# Patient Record
Sex: Female | Born: 1975
Health system: Southern US, Community
[De-identification: ages and names within clinical notes are randomized; demographics above are authoritative.]

## PROBLEM LIST (undated history)

## (undated) ENCOUNTER — Inpatient Hospital Stay (HOSPITAL_COMMUNITY): Payer: Self-pay

## (undated) DIAGNOSIS — K219 Gastro-esophageal reflux disease without esophagitis: Secondary | ICD-10-CM

## (undated) DIAGNOSIS — I10 Essential (primary) hypertension: Secondary | ICD-10-CM

## (undated) DIAGNOSIS — O24112 Pre-existing diabetes mellitus, type 2, in pregnancy, second trimester: Secondary | ICD-10-CM

## (undated) DIAGNOSIS — O169 Unspecified maternal hypertension, unspecified trimester: Secondary | ICD-10-CM

## (undated) DIAGNOSIS — O24419 Gestational diabetes mellitus in pregnancy, unspecified control: Secondary | ICD-10-CM

## (undated) DIAGNOSIS — R05 Cough: Secondary | ICD-10-CM

## (undated) DIAGNOSIS — E119 Type 2 diabetes mellitus without complications: Secondary | ICD-10-CM

## (undated) DIAGNOSIS — O09529 Supervision of elderly multigravida, unspecified trimester: Secondary | ICD-10-CM

## (undated) DIAGNOSIS — R000 Tachycardia, unspecified: Secondary | ICD-10-CM

## (undated) DIAGNOSIS — O0990 Supervision of high risk pregnancy, unspecified, unspecified trimester: Secondary | ICD-10-CM

## (undated) DIAGNOSIS — O4400 Placenta previa specified as without hemorrhage, unspecified trimester: Secondary | ICD-10-CM

## (undated) HISTORY — PX: OTHER SURGICAL HISTORY: SHX169

## (undated) HISTORY — PX: BREAST REDUCTION SURGERY: SHX8

## (undated) HISTORY — DX: Type 2 diabetes mellitus without complications: E11.9

## (undated) HISTORY — DX: Gastro-esophageal reflux disease without esophagitis: K21.9

## (undated) HISTORY — PX: TONSILLECTOMY: SUR1361

## (undated) HISTORY — PX: ABDOMINAL HYSTERECTOMY: SHX81

---

## 2006-05-25 DIAGNOSIS — O24419 Gestational diabetes mellitus in pregnancy, unspecified control: Secondary | ICD-10-CM

## 2010-07-22 ENCOUNTER — Emergency Department (HOSPITAL_COMMUNITY)
Admission: EM | Admit: 2010-07-22 | Discharge: 2010-07-22 | Disposition: A | Discharge status: Home / Self Care | Payer: Medicaid Other | Attending: Emergency Medicine | Admitting: Emergency Medicine

## 2010-07-22 ENCOUNTER — Emergency Department (HOSPITAL_COMMUNITY): Payer: Medicaid Other

## 2010-07-22 DIAGNOSIS — I10 Essential (primary) hypertension: Secondary | ICD-10-CM | POA: Insufficient documentation

## 2010-07-22 DIAGNOSIS — D72829 Elevated white blood cell count, unspecified: Secondary | ICD-10-CM | POA: Insufficient documentation

## 2010-07-22 DIAGNOSIS — R000 Tachycardia, unspecified: Secondary | ICD-10-CM | POA: Insufficient documentation

## 2010-07-22 DIAGNOSIS — R109 Unspecified abdominal pain: Secondary | ICD-10-CM | POA: Insufficient documentation

## 2010-07-22 LAB — COMPREHENSIVE METABOLIC PANEL
Alkaline Phosphatase: 78 U/L (ref 39–117)
BUN: 11 mg/dL (ref 6–23)
CO2: 27 mEq/L (ref 19–32)
Chloride: 104 mEq/L (ref 96–112)
Creatinine, Ser: 0.65 mg/dL (ref 0.4–1.2)
GFR calc non Af Amer: >60 mL/min (ref >60)
Glucose, Bld: 121 mg/dL — ABNORMAL HIGH (ref 70–99)
Total Bilirubin: 0.1 mg/dL — ABNORMAL LOW (ref 0.3–1.2)

## 2010-07-22 LAB — CBC
HCT: 37.4 % (ref 36.0–46.0)
Hemoglobin: 11.9 g/dL — ABNORMAL LOW (ref 12.0–15.0)
MCH: 25.2 pg — ABNORMAL LOW (ref 26.0–34.0)
MCHC: 31.8 g/dL (ref 30.0–36.0)
MCV: 79.2 fL (ref 78.0–100.0)

## 2010-07-22 LAB — POCT PREGNANCY, URINE: Preg Test, Ur: NEGATIVE

## 2010-07-22 LAB — URINALYSIS, ROUTINE W REFLEX MICROSCOPIC
Ketones, ur: NEGATIVE mg/dL
Protein, ur: NEGATIVE mg/dL
Urobilinogen, UA: 0.2 mg/dL (ref 0.0–1.0)

## 2010-07-22 LAB — DIFFERENTIAL
Basophils Relative: 0 % (ref 0–1)
Lymphs Abs: 2.7 10*3/uL (ref 0.7–4.0)
Monocytes Absolute: 1 10*3/uL (ref 0.1–1.0)
Monocytes Relative: 7 % (ref 3–12)
Neutro Abs: 10 10*3/uL — ABNORMAL HIGH (ref 1.7–7.7)

## 2010-07-22 LAB — LIPASE, BLOOD: Lipase: 28 U/L (ref 11–59)

## 2010-07-22 LAB — URINE MICROSCOPIC-ADD ON

## 2010-07-22 MED ORDER — IOHEXOL 300 MG/ML  SOLN
100.0000 mL | Freq: Once | INTRAMUSCULAR | Status: AC | PRN
  Administered 2010-07-22: 100 mL via INTRAVENOUS

## 2011-02-14 ENCOUNTER — Emergency Department (HOSPITAL_COMMUNITY): Payer: Medicaid Other

## 2011-02-14 ENCOUNTER — Emergency Department (HOSPITAL_COMMUNITY)
Admission: EM | Admit: 2011-02-14 | Discharge: 2011-02-14 | Disposition: A | Discharge status: Home / Self Care | Payer: Medicaid Other | Attending: Emergency Medicine | Admitting: Emergency Medicine

## 2011-02-14 DIAGNOSIS — M79609 Pain in unspecified limb: Secondary | ICD-10-CM | POA: Insufficient documentation

## 2011-02-14 DIAGNOSIS — J45909 Unspecified asthma, uncomplicated: Secondary | ICD-10-CM | POA: Insufficient documentation

## 2011-02-14 DIAGNOSIS — M722 Plantar fascial fibromatosis: Secondary | ICD-10-CM | POA: Insufficient documentation

## 2011-02-14 DIAGNOSIS — L680 Hirsutism: Secondary | ICD-10-CM | POA: Insufficient documentation

## 2011-02-14 DIAGNOSIS — I10 Essential (primary) hypertension: Secondary | ICD-10-CM | POA: Insufficient documentation

## 2011-02-14 DIAGNOSIS — M25579 Pain in unspecified ankle and joints of unspecified foot: Secondary | ICD-10-CM | POA: Insufficient documentation

## 2011-05-15 ENCOUNTER — Emergency Department (HOSPITAL_COMMUNITY)
Admission: EM | Admit: 2011-05-15 | Discharge: 2011-05-15 | Disposition: A | Discharge status: Home / Self Care | Payer: No Typology Code available for payment source | Attending: Emergency Medicine | Admitting: Emergency Medicine

## 2011-05-15 ENCOUNTER — Encounter (HOSPITAL_COMMUNITY): Payer: Self-pay | Admitting: *Deleted

## 2011-05-15 DIAGNOSIS — S40012A Contusion of left shoulder, initial encounter: Secondary | ICD-10-CM

## 2011-05-15 DIAGNOSIS — W208XXA Other cause of strike by thrown, projected or falling object, initial encounter: Secondary | ICD-10-CM | POA: Insufficient documentation

## 2011-05-15 DIAGNOSIS — Y9229 Other specified public building as the place of occurrence of the external cause: Secondary | ICD-10-CM | POA: Insufficient documentation

## 2011-05-15 DIAGNOSIS — S40019A Contusion of unspecified shoulder, initial encounter: Secondary | ICD-10-CM | POA: Insufficient documentation

## 2011-05-15 MED ORDER — IBUPROFEN 800 MG PO TABS: 800.0000 mg | ORAL_TABLET | Freq: Three times a day (TID) | ORAL | Status: AC

## 2011-05-15 MED ORDER — IBUPROFEN 800 MG PO TABS
800.0000 mg | ORAL_TABLET | Freq: Once | ORAL | Status: AC
  Administered 2011-05-15: 800 mg via ORAL
  Filled 2011-05-15: qty 1

## 2011-05-15 MED ORDER — HYDROCODONE-ACETAMINOPHEN 5-325 MG PO TABS: 1.0000 | ORAL_TABLET | ORAL | Status: AC | PRN

## 2011-05-15 NOTE — ED Provider Notes (Signed)
History     CSN: [id number]  Arrival date & time 05/15/11  2005   First MD Initiated Contact with Patient 05/15/11 2050      Chief Complaint  Patient presents with  . Shoulder Pain    (Consider location/radiation/quality/duration/timing/severity/associated sxs/prior treatment) HPI History provided by pt.   Pt was walking through the [facility] when a stack of empty, plastic boxes fell from a shelf onto her left shoulder.  Developed shoulder pain approx 5 minutes after accident.  Aggravated by palpation and turning her head.  No associated paresthesias.  Was not hit in head.    History reviewed. No pertinent past medical history.  History reviewed. No pertinent past surgical history.  History reviewed. No pertinent family history.  History  Substance Use Topics  . Smoking status: Not on file  . Smokeless tobacco: Not on file  . Alcohol Use: Not on file    OB History    Grav Para Term Preterm Abortions TAB SAB Ect Mult Living                  Review of Systems  All other systems reviewed and are negative.    Allergies  Review of patient's allergies indicates no known allergies.  Home Medications   Current Outpatient Rx  Name Route Sig Dispense Refill  . GUAIFENESIN ER 600 MG PO TB12 Oral Take 1,200 mg by mouth daily as needed. congestion    . ADULT MULTIVITAMIN W/MINERALS CH Oral Take 1 tablet by mouth daily.    Marland Kitchen HYDROCODONE-ACETAMINOPHEN 5-325 MG PO TABS Oral Take 1 tablet by mouth every 4 (four) hours as needed for pain. 20 tablet 0  . IBUPROFEN 800 MG PO TABS Oral Take 1 tablet (800 mg total) by mouth 3 (three) times daily. 6 tablet 0    BP 138/69  Pulse 96  Temp(Src) 99.7 F (37.6 C) (Oral)  Resp 20  SpO2 100%  Physical Exam  Nursing note and vitals reviewed. Constitutional: She is oriented to person, place, and time. She appears well-developed and well-nourished. No distress.  HENT:  Head: Normocephalic and atraumatic.  Eyes:       Normal  appearance  Neck: Normal range of motion.  Musculoskeletal:       No deformity, ecchymosis or abrasion of left shoulder.  Tenderness left proximal trapezius and over left scapula.  Full passive ROM of shoulder but pain in extreme flexion/abduction.  Distal N/V intact.  Entire spine non-tender.   Neurological: She is alert and oriented to person, place, and time.  Psychiatric: She has a normal mood and affect. Her behavior is normal.    ED Course  Procedures (including critical care time)  Labs Reviewed - No data to display No results found.   1. Contusion of left shoulder       MDM  Pt presents w/ right shoulder injury.  Low clinical suspicion for fx/dislocation based on mechanism and exam.  Pt has been reassured.  D/c'd home w/ vicodin and ibuprofen for pain.  Recommended ice and rest as well as f/u with PCP for persistent sx.         Otilio Miu, PA 05/16/11 1500
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
## 2011-05-16 NOTE — ED Provider Notes (Signed)
History/physical exam/procedure(s) were performed by non-physician practitioner and as supervising physician I was immediately available for consultation/collaboration. I have reviewed all notes and am in agreement with care and plan.   Allix Blomquist S Joedy Eickhoff, MD 05/16/11 1948 

## 2011-05-17 ENCOUNTER — Ambulatory Visit: Payer: Medicaid Other | Attending: Orthopedic Surgery | Admitting: Physical Therapy

## 2011-05-17 DIAGNOSIS — IMO0001 Reserved for inherently not codable concepts without codable children: Secondary | ICD-10-CM | POA: Insufficient documentation

## 2011-05-17 DIAGNOSIS — M25676 Stiffness of unspecified foot, not elsewhere classified: Secondary | ICD-10-CM | POA: Insufficient documentation

## 2011-05-17 DIAGNOSIS — M25579 Pain in unspecified ankle and joints of unspecified foot: Secondary | ICD-10-CM | POA: Insufficient documentation

## 2011-05-17 DIAGNOSIS — M25673 Stiffness of unspecified ankle, not elsewhere classified: Secondary | ICD-10-CM | POA: Insufficient documentation

## 2011-05-24 ENCOUNTER — Ambulatory Visit: Payer: Medicaid Other | Admitting: Physical Therapy

## 2011-05-31 ENCOUNTER — Ambulatory Visit: Payer: Medicaid Other | Attending: Orthopedic Surgery | Admitting: Physical Therapy

## 2011-05-31 DIAGNOSIS — M25579 Pain in unspecified ankle and joints of unspecified foot: Secondary | ICD-10-CM | POA: Insufficient documentation

## 2011-05-31 DIAGNOSIS — IMO0001 Reserved for inherently not codable concepts without codable children: Secondary | ICD-10-CM | POA: Insufficient documentation

## 2011-05-31 DIAGNOSIS — M25673 Stiffness of unspecified ankle, not elsewhere classified: Secondary | ICD-10-CM | POA: Insufficient documentation

## 2011-05-31 DIAGNOSIS — M25676 Stiffness of unspecified foot, not elsewhere classified: Secondary | ICD-10-CM | POA: Insufficient documentation

## 2011-06-07 ENCOUNTER — Ambulatory Visit: Payer: Medicaid Other | Admitting: Physical Therapy

## 2011-07-03 ENCOUNTER — Emergency Department (HOSPITAL_COMMUNITY)
Admission: EM | Admit: 2011-07-03 | Discharge: 2011-07-04 | Disposition: A | Discharge status: Home / Self Care | Payer: Medicaid Other | Attending: Emergency Medicine | Admitting: Emergency Medicine

## 2011-07-03 ENCOUNTER — Encounter (HOSPITAL_COMMUNITY): Payer: Self-pay | Admitting: *Deleted

## 2011-07-03 DIAGNOSIS — K529 Noninfective gastroenteritis and colitis, unspecified: Secondary | ICD-10-CM

## 2011-07-03 DIAGNOSIS — R000 Tachycardia, unspecified: Secondary | ICD-10-CM | POA: Insufficient documentation

## 2011-07-03 DIAGNOSIS — IMO0001 Reserved for inherently not codable concepts without codable children: Secondary | ICD-10-CM | POA: Insufficient documentation

## 2011-07-03 DIAGNOSIS — K5289 Other specified noninfective gastroenteritis and colitis: Secondary | ICD-10-CM | POA: Insufficient documentation

## 2011-07-03 DIAGNOSIS — R509 Fever, unspecified: Secondary | ICD-10-CM | POA: Insufficient documentation

## 2011-07-03 MED ORDER — ONDANSETRON HCL 4 MG/2ML IJ SOLN
4.0000 mg | Freq: Once | INTRAMUSCULAR | Status: AC
  Administered 2011-07-04: 4 mg via INTRAVENOUS
  Filled 2011-07-03: qty 2

## 2011-07-03 NOTE — ED Notes (Signed)
Lt green, lavender, and yellow tubes sent to Lab. drk green tube sent to [facility]

## 2011-07-03 NOTE — ED Notes (Signed)
Urine sample sent to Lab

## 2011-07-03 NOTE — ED Notes (Signed)
Pt in c/o n/v/d and body aches since last night

## 2011-07-04 LAB — DIFFERENTIAL
Basophils Absolute: 0 10*3/uL (ref 0.0–0.1)
Basophils Relative: 0 % (ref 0–1)
Eosinophils Relative: 0 % (ref 0–5)
Lymphocytes Relative: 9 % — ABNORMAL LOW (ref 12–46)
Monocytes Absolute: 1.3 10*3/uL — ABNORMAL HIGH (ref 0.1–1.0)
Monocytes Relative: 11 % (ref 3–12)
Neutro Abs: 9.7 10*3/uL — ABNORMAL HIGH (ref 1.7–7.7)

## 2011-07-04 LAB — URINALYSIS, ROUTINE W REFLEX MICROSCOPIC
Glucose, UA: NEGATIVE mg/dL
Hgb urine dipstick: NEGATIVE
Ketones, ur: 40 mg/dL — AB
Protein, ur: 30 mg/dL — AB
pH: 6 (ref 5.0–8.0)

## 2011-07-04 LAB — CBC
HCT: 36.3 % (ref 36.0–46.0)
Hemoglobin: 11.9 g/dL — ABNORMAL LOW (ref 12.0–15.0)
MCHC: 32.8 g/dL (ref 30.0–36.0)
MCV: 77.1 fL — ABNORMAL LOW (ref 78.0–100.0)
RDW: 16.3 % — ABNORMAL HIGH (ref 11.5–15.5)

## 2011-07-04 LAB — BASIC METABOLIC PANEL
BUN: 12 mg/dL (ref 6–23)
CO2: 22 mEq/L (ref 19–32)
Calcium: 8.8 mg/dL (ref 8.4–10.5)
Chloride: 99 mEq/L (ref 96–112)
Creatinine, Ser: 0.63 mg/dL (ref 0.50–1.10)

## 2011-07-04 LAB — URINE MICROSCOPIC-ADD ON

## 2011-07-04 MED ORDER — PROMETHAZINE HCL 25 MG PO TABS: 25.0000 mg | ORAL_TABLET | Freq: Four times a day (QID) | ORAL | Status: AC | PRN

## 2011-07-04 MED ORDER — KETOROLAC TROMETHAMINE 30 MG/ML IJ SOLN
30.0000 mg | Freq: Once | INTRAMUSCULAR | Status: AC
  Administered 2011-07-04: 30 mg via INTRAVENOUS
  Filled 2011-07-04: qty 1

## 2011-07-04 NOTE — ED Notes (Signed)
Pt able to drink a cup of water. Pt denies nausea. Does state that she has some "gurgling" in her stomach.

## 2011-07-04 NOTE — ED Provider Notes (Signed)
History     CSN: [id number]  Arrival date & time 07/03/11  4098   First MD Initiated Contact with Patient 07/03/11 2313      Chief Complaint  Patient presents with  . Emesis  . Diarrhea    (Consider location/radiation/quality/duration/timing/severity/associated sxs/prior treatment) HPI Comments: Patient here with a day history of chills, nausea, vomiting and diarrhea - states that she has vomited at least 10 times since this morning - states NBNB emesis - reports last several times was just "dry heaves" - reports diarrhea started about 2 hours after the emesis - reports multiple episodes of diarrhea - states no blood or mucous noted - denies abdominal pain but reports bloating and "gurgling" sounds.  Denies pregnancy, vaginal discharge or bleeding, flank or back pain.  Reports generalized fatigue and body aches but unknown if fever.  Patient is a 36 y.o. female presenting with vomiting and diarrhea. The history is provided by the patient.  Emesis  This is a new problem. The current episode started yesterday. The problem occurs more than 10 times per day. The problem has not changed since onset.The emesis has an appearance of stomach contents. The maximum temperature recorded prior to her arrival was 100 to 100.9 F. Associated symptoms include diarrhea, a fever, headaches and myalgias. Pertinent negatives include no abdominal pain, no arthralgias, no chills, no cough, no sweats and no URI.  Diarrhea The primary symptoms include fever, nausea, vomiting, diarrhea and myalgias. Primary symptoms do not include weight loss, fatigue, abdominal pain, melena, hematemesis, jaundice, hematochezia, dysuria, arthralgias or rash. The illness began yesterday. The onset was gradual. The problem has not changed since onset. The illness does not include chills.    Past Medical History  Diagnosis Date  . Asthma     History reviewed. No pertinent past surgical history.  History reviewed. No pertinent  family history.  History  Substance Use Topics  . Smoking status: Not on file  . Smokeless tobacco: Not on file  . Alcohol Use:     OB History    Grav Para Term Preterm Abortions TAB SAB Ect Mult Living                  Review of Systems  Constitutional: Positive for fever. Negative for chills, weight loss and fatigue.  Respiratory: Negative for cough.   Gastrointestinal: Positive for nausea, vomiting and diarrhea. Negative for abdominal pain, melena, hematochezia, hematemesis and jaundice.  Genitourinary: Negative for dysuria.  Musculoskeletal: Positive for myalgias. Negative for arthralgias.  Skin: Negative for rash.  Neurological: Positive for headaches.  All other systems reviewed and are negative.    Allergies  Review of patient's allergies indicates no known allergies.  Home Medications   Current Outpatient Rx  Name Route Sig Dispense Refill  . ASPIRIN EC 81 MG PO TBEC Oral Take 162 mg by mouth daily.    Marland Kitchen LOPERAMIDE HCL 2 MG PO CAPS Oral Take 4 mg by mouth 4 (four) times daily as needed. diarrhea    . ADULT MULTIVITAMIN W/MINERALS CH Oral Take 1 tablet by mouth daily.    Marland Kitchen PROMETHAZINE HCL 25 MG PO TABS Oral Take 1 tablet (25 mg total) by mouth every 6 (six) hours as needed for nausea. 30 tablet 0    BP 111/55  Pulse 73  Temp(Src) 98.7 F (37.1 C) (Oral)  Resp 14  SpO2 96%  Physical Exam  Nursing note and vitals reviewed. Constitutional: She is oriented to person, place, and time. She appears  well-developed and well-nourished. No distress.  HENT:  Head: Normocephalic and atraumatic.  Right Ear: External ear normal.  Left Ear: External ear normal.  Nose: Nose normal.       Dry mucous membranes  Eyes: Conjunctivae are normal. Pupils are equal, round, and reactive to light. No scleral icterus.  Neck: Normal range of motion. Neck supple.  Cardiovascular: Regular rhythm and normal heart sounds.  Exam reveals no gallop and no friction rub.   No murmur  heard.      tachycardia  Pulmonary/Chest: Effort normal and breath sounds normal. No respiratory distress. She has no wheezes. She has no rales. She exhibits no tenderness.  Abdominal: Soft. She exhibits no distension and no mass. There is no tenderness. There is no rebound and no guarding.       Hyperactive BS  Musculoskeletal: Normal range of motion. She exhibits no edema and no tenderness.  Lymphadenopathy:    She has no cervical adenopathy.  Neurological: She is alert and oriented to person, place, and time. No cranial nerve deficit.  Skin: Skin is warm and dry. No rash noted. No erythema. No pallor.  Psychiatric: She has a normal mood and affect. Her behavior is normal. Judgment and thought content normal.    ED Course  Procedures (including critical care time)  Labs Reviewed  CBC - Abnormal; Notable for the following:    WBC 12.1 (*)    Hemoglobin 11.9 (*)    MCV 77.1 (*)    MCH 25.3 (*)    RDW 16.3 (*)    All other components within normal limits  DIFFERENTIAL - Abnormal; Notable for the following:    Neutrophils Relative 80 (*)    Neutro Abs 9.7 (*)    Lymphocytes Relative 9 (*)    Monocytes Absolute 1.3 (*)    All other components within normal limits  BASIC METABOLIC PANEL - Abnormal; Notable for the following:    Sodium 134 (*)    Glucose, Bld 110 (*)    All other components within normal limits  URINALYSIS, ROUTINE W REFLEX MICROSCOPIC - Abnormal; Notable for the following:    Color, Urine AMBER (*) BIOCHEMICALS MAY BE AFFECTED BY COLOR   APPearance TURBID (*)    Specific Gravity, Urine 1.034 (*)    Bilirubin Urine SMALL (*)    Ketones, ur 40 (*)    Protein, ur 30 (*)    Leukocytes, UA TRACE (*)    All other components within normal limits  URINE MICROSCOPIC-ADD ON - Abnormal; Notable for the following:    Squamous Epithelial / LPF MANY (*)    All other components within normal limits  PREGNANCY, URINE   No results found.   1. Gastroenteritis        MDM  Patient reports improvement after 2 liters of fluids and zofran for nausea - is able to keep down po fluids at this time but continues to complain of "gurgling sounds" in abdomen.  Labs reassuring - doubt infectious process or surgical abdomen.       Izola Price McKittrick, [place] 07/04/11 [phone]

## 2011-07-04 NOTE — ED Provider Notes (Signed)
Medical screening examination/treatment/procedure(s) were performed by non-physician practitioner and as supervising physician I was immediately available for consultation/collaboration.   Lyanne Co, MD 07/04/11 [phone]

## 2011-07-04 NOTE — Discharge Instructions (Signed)
Clear Liquid Diet The clear liquid dietconsists of foods that are liquid or will become liquid at room temperature.You should be able to see through the liquid and beverages. Examples of foods allowed on a clear liquid diet include fruit juice, broth or bouillon, gelatin, or frozen ice pops. The purpose of this diet is to provide necessary fluid, electrolytes such as sodium and potassium, and energy to keep the body functioning during times when you are not able to consume a regular diet.A clear liquid diet should not be continued for long periods of time as it is not nutritionally adequate.  REASONS FOR USING A CLEAR LIQUID DIET  In sudden onset (acute) conditions for a patient before or after surgery.   As the first step in oral feeding.   For fluid and electrolyte replacement in diarrheal diseases.   As a diet before certain medical tests are performed.  ADEQUACY The clear liquid diet is adequate only in ascorbic acid, according to the Recommended Dietary Allowances of the National Research Council. CHOOSING FOODS Breads and Starches  Allowed:  None are allowed.   Avoid: All are avoided.  Vegetables  Allowed:  Strained tomato or vegetable juice.   Avoid: Any others.  Fruit  Allowed:  Strained fruit juices and fruit drinks. Include 1 serving of citrus or vitamin C-enriched fruit juice daily.   Avoid: Any others.  Meat and Meat Substitutes  Allowed:  None are allowed.   Avoid: All are avoided.  Milk  Allowed:  None are allowed.   Avoid: All are avoided.  Soups and Combination Foods  Allowed:  Clear bouillon, broth, or strained broth-based soups.   Avoid: Any others.  Desserts and Sweets  Allowed:  Sugar, honey. High protein gelatin. Flavored gelatin, ices, or frozen ice pops that do not contain milk.   Avoid: Any others.  Fats and Oils  Allowed:  None are allowed.   Avoid: All are avoided.  Beverages  Allowed: Cereal beverages, coffee (regular or  decaffeinated), tea, or soda at the discretion of your caregiver.   Avoid: Any others.  Condiments  Allowed:  Iodized salt.   Avoid: Any others, including pepper.  Supplements  Allowed:  Liquid nutrition beverages.   Avoid: Any others that contain lactose or fiber.  SAMPLE MEAL PLAN Breakfast  4 oz (120 mL) strained orange juice.    to 1 cup (125 to 250 mL) gelatin (plain or fortified).   1 cup (250 mL) beverage (coffee or tea).   Sugar, if desired.  Midmorning Snack   cup (125 mL) gelatin (plain or fortified).  Lunch  1 cup (250 mL) broth or consomm.   4 oz (120 mL) strained grapefruit juice.    cup (125 mL) gelatin (plain or fortified).   1 cup (250 mL) beverage (coffee or tea).   Sugar, if desired.  Midafternoon Snack   cup (125 mL) fruit ice.    cup (125 mL) strained fruit juice.  Dinner  1 cup (250 mL) broth or consomm.    cup (125 mL) cranberry juice.    cup (125 mL) flavored gelatin (plain or fortified).   1 cup (250 mL) beverage (coffee or tea).   Sugar, if desired.  Evening Snack  4 oz (120 mL) strained apple juice (vitamin C-fortified).    cup (125 mL) flavored gelatin (plain or fortified).  Document Released: 04/10/2005 Document Revised: 03/30/2011 Document Reviewed: 07/08/2010 Exit[employer] Patient Information 2012 [employer].Viral Gastroenteritis Viral gastroenteritis is also known as stomach flu. This condition   affects the stomach and intestinal tract. It can cause sudden diarrhea and vomiting. The illness typically lasts 3 to 8 days. Most people develop an immune response that eventually gets rid of the virus. While this natural response develops, the virus can make you quite ill. CAUSES  Many different viruses can cause gastroenteritis, such as rotavirus or noroviruses. You can catch one of these viruses by consuming contaminated food or water. You may also catch a virus by sharing utensils or other personal items with an  infected person or by touching a contaminated surface. SYMPTOMS  The most common symptoms are diarrhea and vomiting. These problems can cause a severe loss of body fluids (dehydration) and a body salt (electrolyte) imbalance. Other symptoms may include:  Fever.   Headache.   Fatigue.   Abdominal pain.  DIAGNOSIS  Your caregiver can usually diagnose viral gastroenteritis based on your symptoms and a physical exam. A stool sample may also be taken to test for the presence of viruses or other infections. TREATMENT  This illness typically goes away on its own. Treatments are aimed at rehydration. The most serious cases of viral gastroenteritis involve vomiting so severely that you are not able to keep fluids down. In these cases, fluids must be given through an intravenous line (IV). HOME CARE INSTRUCTIONS   Drink enough fluids to keep your urine clear or pale yellow. Drink small amounts of fluids frequently and increase the amounts as tolerated.   Ask your caregiver for specific rehydration instructions.   Avoid:   Foods high in sugar.   Alcohol.   Carbonated drinks.   Tobacco.   Juice.   Caffeine drinks.   Extremely hot or cold fluids.   Fatty, greasy foods.   Too much intake of anything at one time.   Dairy products until 24 to 48 hours after diarrhea stops.   You may consume probiotics. Probiotics are active cultures of beneficial bacteria. They may lessen the amount and number of diarrheal stools in adults. Probiotics can be found in yogurt with active cultures and in supplements.   Wash your hands well to avoid spreading the virus.   Only take over-the-counter or prescription medicines for pain, discomfort, or fever as directed by your caregiver. Do not give aspirin to children. Antidiarrheal medicines are not recommended.   Ask your caregiver if you should continue to take your regular prescribed and over-the-counter medicines.   Keep all follow-up appointments  as directed by your caregiver.  SEEK IMMEDIATE MEDICAL CARE IF:   You are unable to keep fluids down.   You do not urinate at least once every 6 to 8 hours.   You develop shortness of breath.   You notice blood in your stool or vomit. This may look like coffee grounds.   You have abdominal pain that increases or is concentrated in one small area (localized).   You have persistent vomiting or diarrhea.   You have a fever.   The patient is a child younger than 3 months, and he or she has a fever.   The patient is a child older than 3 months, and he or she has a fever and persistent symptoms.   The patient is a child older than 3 months, and he or she has a fever and symptoms suddenly get worse.   The patient is a baby, and he or she has no tears when crying.  MAKE SURE YOU:   Understand these instructions.   Will watch your condition.     Will get help right away if you are not doing well or get worse.  Document Released: 04/10/2005 Document Revised: 03/30/2011 Document Reviewed: 01/25/2011 Exit[employer] Patient Information 2012 [employer]. 

## 2011-08-04 ENCOUNTER — Emergency Department (INDEPENDENT_AMBULATORY_CARE_PROVIDER_SITE_OTHER): Payer: No Typology Code available for payment source

## 2011-08-04 ENCOUNTER — Emergency Department (HOSPITAL_BASED_OUTPATIENT_CLINIC_OR_DEPARTMENT_OTHER)
Admission: EM | Admit: 2011-08-04 | Discharge: 2011-08-04 | Disposition: A | Discharge status: Home / Self Care | Payer: No Typology Code available for payment source | Attending: Emergency Medicine | Admitting: Emergency Medicine

## 2011-08-04 ENCOUNTER — Encounter (HOSPITAL_BASED_OUTPATIENT_CLINIC_OR_DEPARTMENT_OTHER): Payer: Self-pay | Admitting: *Deleted

## 2011-08-04 DIAGNOSIS — M542 Cervicalgia: Secondary | ICD-10-CM | POA: Insufficient documentation

## 2011-08-04 DIAGNOSIS — M25519 Pain in unspecified shoulder: Secondary | ICD-10-CM

## 2011-08-04 DIAGNOSIS — M549 Dorsalgia, unspecified: Secondary | ICD-10-CM | POA: Insufficient documentation

## 2011-08-04 MED ORDER — IBUPROFEN 800 MG PO TABS: 800.0000 mg | ORAL_TABLET | Freq: Three times a day (TID) | ORAL | Status: AC

## 2011-08-04 MED ORDER — HYDROCODONE-ACETAMINOPHEN 5-325 MG PO TABS: 2.0000 | ORAL_TABLET | ORAL | Status: AC | PRN
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
## 2011-08-05 NOTE — ED Provider Notes (Signed)
Medical screening examination/treatment/procedure(s) were performed by non-physician practitioner and as supervising physician I was immediately available for consultation/collaboration.   Forbes Cellar, MD 08/05/11 [phone]

## 2011-09-22 ENCOUNTER — Emergency Department (HOSPITAL_COMMUNITY)
Admission: EM | Admit: 2011-09-22 | Discharge: 2011-09-22 | Disposition: A | Discharge status: Home / Self Care | Payer: Medicaid Other | Attending: Emergency Medicine | Admitting: Emergency Medicine

## 2011-09-22 ENCOUNTER — Emergency Department (HOSPITAL_COMMUNITY): Payer: Medicaid Other

## 2011-09-22 ENCOUNTER — Encounter (HOSPITAL_COMMUNITY): Payer: Self-pay

## 2011-09-22 DIAGNOSIS — R1031 Right lower quadrant pain: Secondary | ICD-10-CM | POA: Insufficient documentation

## 2011-09-22 DIAGNOSIS — Z711 Person with feared health complaint in whom no diagnosis is made: Secondary | ICD-10-CM

## 2011-09-22 DIAGNOSIS — R109 Unspecified abdominal pain: Secondary | ICD-10-CM | POA: Insufficient documentation

## 2011-09-22 DIAGNOSIS — J45909 Unspecified asthma, uncomplicated: Secondary | ICD-10-CM | POA: Insufficient documentation

## 2011-09-22 DIAGNOSIS — R110 Nausea: Secondary | ICD-10-CM | POA: Insufficient documentation

## 2011-09-22 DIAGNOSIS — R6883 Chills (without fever): Secondary | ICD-10-CM | POA: Insufficient documentation

## 2011-09-22 DIAGNOSIS — R102 Pelvic and perineal pain: Secondary | ICD-10-CM

## 2011-09-22 DIAGNOSIS — R351 Nocturia: Secondary | ICD-10-CM | POA: Insufficient documentation

## 2011-09-22 DIAGNOSIS — R350 Frequency of micturition: Secondary | ICD-10-CM | POA: Insufficient documentation

## 2011-09-22 DIAGNOSIS — R300 Dysuria: Secondary | ICD-10-CM | POA: Insufficient documentation

## 2011-09-22 HISTORY — DX: Gestational diabetes mellitus in pregnancy, unspecified control: O24.419

## 2011-09-22 LAB — DIFFERENTIAL
Basophils Relative: 0 % (ref 0–1)
Eosinophils Absolute: 0.3 10*3/uL (ref 0.0–0.7)
Eosinophils Relative: 2 % (ref 0–5)
Lymphs Abs: 3.9 10*3/uL (ref 0.7–4.0)
Monocytes Absolute: 1.3 10*3/uL — ABNORMAL HIGH (ref 0.1–1.0)
Monocytes Relative: 8 % (ref 3–12)

## 2011-09-22 LAB — CBC
HCT: 37.4 % (ref 36.0–46.0)
Hemoglobin: 12.3 g/dL (ref 12.0–15.0)
MCH: 25.7 pg — ABNORMAL LOW (ref 26.0–34.0)
MCHC: 32.9 g/dL (ref 30.0–36.0)
MCV: 78.1 fL (ref 78.0–100.0)

## 2011-09-22 LAB — POCT I-STAT, CHEM 8
Calcium, Ion: 1.24 mmol/L (ref 1.12–1.32)
Creatinine, Ser: 0.6 mg/dL (ref 0.50–1.10)
Glucose, Bld: 69 mg/dL — ABNORMAL LOW (ref 70–99)
HCT: 39 % (ref 36.0–46.0)
Hemoglobin: 13.3 g/dL (ref 12.0–15.0)

## 2011-09-22 LAB — URINE MICROSCOPIC-ADD ON

## 2011-09-22 LAB — URINALYSIS, ROUTINE W REFLEX MICROSCOPIC
Glucose, UA: NEGATIVE mg/dL
Specific Gravity, Urine: 1.023 (ref 1.005–1.030)
Urobilinogen, UA: 1 mg/dL (ref 0.0–1.0)

## 2011-09-22 LAB — GLUCOSE, CAPILLARY: Glucose-Capillary: 123 mg/dL — ABNORMAL HIGH (ref 70–99)

## 2011-09-22 LAB — WET PREP, GENITAL
Trich, Wet Prep: NONE SEEN
Yeast Wet Prep HPF POC: NONE SEEN

## 2011-09-22 MED ORDER — OXYCODONE-ACETAMINOPHEN 5-325 MG PO TABS: 1.0000 | ORAL_TABLET | Freq: Four times a day (QID) | ORAL | Status: AC | PRN

## 2011-09-22 MED ORDER — ONDANSETRON HCL 4 MG/2ML IJ SOLN
4.0000 mg | INTRAMUSCULAR | Status: DC | PRN
  Administered 2011-09-22: 4 mg via INTRAVENOUS
  Filled 2011-09-22: qty 2

## 2011-09-22 MED ORDER — DOXYCYCLINE HYCLATE 100 MG PO CAPS: 100.0000 mg | ORAL_CAPSULE | Freq: Two times a day (BID) | ORAL | Status: AC

## 2011-09-22 MED ORDER — MORPHINE SULFATE 4 MG/ML IJ SOLN
4.0000 mg | Freq: Once | INTRAMUSCULAR | Status: AC
  Administered 2011-09-22: 4 mg via INTRAVENOUS
  Filled 2011-09-22: qty 1

## 2011-09-22 MED ORDER — CEFTRIAXONE SODIUM 250 MG IJ SOLR
250.0000 mg | Freq: Once | INTRAMUSCULAR | Status: AC
  Administered 2011-09-22: 250 mg via INTRAMUSCULAR

## 2011-09-22 MED ORDER — SODIUM CHLORIDE 0.9 % IV BOLUS (SEPSIS)
500.0000 mL | INTRAVENOUS | Status: AC
  Administered 2011-09-22: 1000 mL via INTRAVENOUS

## 2011-09-22 MED ORDER — OXYCODONE-ACETAMINOPHEN 5-325 MG PO TABS
1.0000 | ORAL_TABLET | Freq: Once | ORAL | Status: AC
  Administered 2011-09-22: 1 via ORAL
  Filled 2011-09-22: qty 1

## 2011-09-22 NOTE — ED Notes (Signed)
Pt presents with 7 day h/o RLQ abdominal pain that radiates into R leg and around to R flank.  Pt reports increased urine output (reports she voids 10-11 times at night), , denies any dysuria.  Pt reports her last period was 2 weeks ago, reports noting "pink" vaginal discharge today.  Pt denies any diarrhea, intermittent nausea;  Pt denies any rectal pain but reports "when I sit down, it feels like I'm sitting on something".

## 2011-09-22 NOTE — ED Notes (Signed)
Pelvic cart setup at bedside. Pt in CT

## 2011-09-22 NOTE — ED Notes (Signed)
Pt in CT.

## 2011-09-22 NOTE — ED Provider Notes (Signed)
History     CSN: [id number]  Arrival date & time 09/22/11  1432   First MD Initiated Contact with Patient 09/22/11 1502      Chief Complaint  Patient presents with  . Abdominal Pain    (Consider location/radiation/quality/duration/timing/severity/associated sxs/prior treatment) HPI Comments: Patient with history of asthma and chronic back pain presents emergency department chief complaint abdominal pain.  Onset of symptoms began about 7 days ago and is located primarily in the suprapubic and right lower quadrant as well as the right CVA areas.  Pain is described as a constant pain rated at 7/10 in severity and associated with dysuria, increased urinary frequency, nocturia, nausea and chills. The patient denies any fevers, vomiting, hematuria, abnormal vaginal dc, numbness or tingling of extremities, saddle paresthesias, loss control of bowel or bladder, IV drug use, cancer, chest pain, shortness of breath, or rash.  Patient has no other complaints at this time.  Note that patient is sexually active with her husband and does not use protection.  Patient is on Depo-Provera for birth control with her last menstrual period about 2 weeks ago.   Patient is a 36 y.o. female presenting with abdominal pain. The history is provided by the patient.  Abdominal Pain The primary symptoms of the illness include abdominal pain.    Past Medical History  Diagnosis Date  . Asthma   . Gestational diabetes     Past Surgical History  Procedure Date  . Breast reduction surgery     No family history on file.  History  Substance Use Topics  . Smoking status: Never Smoker   . Smokeless tobacco: Not on file  . Alcohol Use: No    OB History    Grav Para Term Preterm Abortions TAB SAB Ect Mult Living                  Review of Systems  Gastrointestinal: Positive for abdominal pain.    Allergies  Review of patient's allergies indicates no known allergies.  Home Medications   Current  Outpatient Rx  Name Route Sig Dispense Refill  . LORATADINE PO Oral Take 1 tablet by mouth daily.    . ADULT MULTIVITAMIN W/MINERALS CH Oral Take 1 tablet by mouth daily.    Margart Sickles PO Oral Take 2 tablets by mouth daily. Patient used this medication for pain.      BP 128/74  Pulse 76  Temp(Src) 98.1 F (36.7 C) (Oral)  Resp 18  Ht 5\' 4"  (1.626 m)  Wt 246 lb (111.585 kg)  BMI 42.23 kg/m2  SpO2 97%  LMP 09/08/2011  Physical Exam  Nursing note and vitals reviewed. Constitutional: She is oriented to person, place, and time. She appears well-developed and well-nourished. No distress.  HENT:  Head: Normocephalic and atraumatic.  Eyes: Conjunctivae and EOM are normal.  Neck: Normal range of motion.  Cardiovascular:       Regular rate rhythm, intact distal pulses  Pulmonary/Chest: Effort normal.       Lungs clear to auscultation bilaterally  Abdominal:       Tenderness to palpation of superpubic region.  No CVA tenderness.  Genitourinary: Pelvic exam was performed with patient supine.       Exam performed by Jaci Carrel,  exam chaperoned Date: 09/22/2011 Pelvic exam: normal external genitalia without evidence of trauma. VULVA: normal appearing vulva with no masses, tenderness or lesion. VAGINA: normal appearing vagina with normal color and discharge, no lesions. CERVIX: mild cervical motion tenderness.  cervical os closed with out purulent discharge, but blood present; Wet prep and DNA probe for chlamydia and GC obtained.   ADNEXA: normal adnexa in size, nontender and no masses    Musculoskeletal: Normal range of motion.  Neurological: She is alert and oriented to person, place, and time.  Skin: Skin is warm and dry. No rash noted. She is not diaphoretic.  Psychiatric: She has a normal mood and affect. Her behavior is normal.    ED Course  Procedures (including critical care time)  Labs Reviewed  GLUCOSE, CAPILLARY - Abnormal; Notable for the following:     Glucose-Capillary 123 (*)    All other components within normal limits  POCT PREGNANCY, URINE  URINALYSIS, ROUTINE W REFLEX MICROSCOPIC   No results found.   No diagnosis found.  Concern for pyelonephritis based on presentation. UA pending. NO UTI present..Further workup and treatment could be done if symptoms persist, worsen or new related symptoms occur. The patient will call in that eventuality. Wk up  Indicated. Will Check for possible stone. Labs/imaging pending & pain managed in ED.   MDM  Possible STD?  Patient to be discharged with instructions to follow up with OBGYN. Discussed importance of using protection when sexually active. Pt understands that they have GC/Chlamydia cultures pending and that they will need to inform all sexual partners if results return positive. Pt has been treated prophylacticly with  rocephin & will be dc w doxy due to pts history, pelvic exam, and wet prep with increased WBCs. Pt w mild concerning for PID because hemodynamically stable and mild cervical motion tenderness on pelvic exam.         Jaci Carrel, PA-C 09/22/11 1814

## 2011-09-22 NOTE — ED Provider Notes (Signed)
Medical screening examination/treatment/procedure(s) were performed by non-physician practitioner and as supervising physician I was immediately available for consultation/collaboration.   Loren Racer, MD 09/22/11 1944

## 2011-09-22 NOTE — Discharge Instructions (Signed)
You have been treated in the emergency department for an infection, possibly sexually transmitted. Results of your gonorrhea and chlamydia tests are pending and you will be notified if they are positive. It is very important to practice safe sex and use condoms when sexually active. If your results are positive you need to notify all sexual partners so they can be treated as well. The website http://www.dontspreadit.com/ can be used to send anonymous text messages or emails to alert sexual contacts. Follow up with your doctor, or OBGYN in regards to today's visit.   ° °Gonorrhea and Chlamydia °SYMPTOMS  °In females, symptoms may go unnoticed. Symptoms that are more noticeable can include:  °Belly (abdominal) pain.  °Painful intercourse.  °Watery mucous-like discharge from the vagina.  °Miscarriage.  °Discomfort when urinating.  °Inflammation of the rectum.  °Abnormal gray-green frothy vaginal discharge  °Vaginal itching and irritatio  °Itching and irritation of the area outside the vagina.   °Painful urination.  °Bleeding after sexual intercourse.  °In males, symptoms include:  °Burning with urination.  °Pain in the testicles.  °Watery mucous-like discharge from the penis.  °It can cause longstanding (chronic) pelvic pain after frequent infections.  °TREATMENT  °PID can cause women to not be able to have children (sterile) if left untreated or if half-treated.  It is important to finish ALL medications given to you.  °This is a sexually transmitted infection. So you are also at risk for other sexually transmitted diseases, including HIV (AIDS), it is recommended that you get tested. °HOME CARE INSTRUCTIONS  °Warning: This infection is contagious. Do not have sex until treatment is completed. Follow up at your caregiver's office or the clinic to which you were referred. If your diagnosis (learning what is wrong) is confirmed by culture or some other method, your recent sexual contacts need treatment. Even if they are  symptom free or have a negative culture or evaluation, they should be treated.  °PREVENTION  °Women should use sanitary pads instead of tampons for vaginal discharge.  °Wipe front to back after using the toilet and avoid douching.   °Practice safe sex, use condoms, have only one sex partner and be sure your sex partner is not having sex with others.  °Ask your caregiver to test you for chlamydia at your regular checkups or sooner if you are having symptoms.  °Ask for further information if you are pregnant.  °SEEK IMMEDIATE MEDICAL CARE IF:  °You develop an oral temperature above 102° F (38.9° C), not controlled by medications or lasting more than 2 days.  °You develop an increase in pain.  °You develop any type of abnormal discharge.  °You develop vaginal bleeding and it is not time for your period.  °You develop painful intercourse.  °  ° ° °RESOURCE GUIDE ° °Dental Problems ° °Patients with Medicaid: °[facility]                     [facility] °[address]                                           [address] °Phone:  [phone]                                                    Phone:  510-2600 ° °If unable to pay or uninsured, contact:  [facility] or [facility] to become qualified for the adult dental clinic. ° °Chronic Pain Problems °Contact [facility]  [phone] °Patients need to be referred by their primary care doctor. ° °Insufficient Money for Medicine °Contact [employer]:  call "211" or [facility] 271-5999. ° °No Primary Care Doctor °Call [facility]  832-8000 °Other agencies that provide inexpensive medical care °   [facility]  [phone] °   [facility]  832-7272 °   [facility]  [phone] °   [facility]  832-4777 °   [facility]  [phone] °   [facility]  [phone] ° °Psychological Services °[facility]  [phone] °[facility]   [phone] °[facility]   [phone] (emergency services 641-4993) ° °Substance Abuse Resources °[facility]  336-882-2125 °[facility] 336-784-9470 °[facility] 336-285-9073 °[facility] 336-845-3988 °Residential & Outpatient Substance Abuse Program  800-659-3381 ° °Abuse/Neglect °[place] [facility] (336) 641-3795 °[place] [facility] Hotline 800-378-5315 (After Hours) ° °Emergency Shelter °[facility] (336) 271-5985 ° °Maternity Homes °[facility] (336) 275-9566 °[facility] (704) 372-4663 ° °MRSA Hotline #:   832-7006 ° ° ° °[place] [facility] ° °[facility] [place]     [employer]                          [facility] °[address] [place]                       [address]      [address]  °[place]                                                [place]                            [place] °Phone:  349-3220                                   Phone:  [phone]                 Phone:  342-8140 ° °[facility] °Phone:  342-8316 ° °[place] [facility] Hotline °(336) 342-1394 °(336) 342-3537 (After Hours) ° ° °

## 2011-09-22 NOTE — ED Notes (Signed)
CBG 86. 

## 2011-09-23 LAB — GC/CHLAMYDIA PROBE AMP, GENITAL: GC Probe Amp, Genital: NEGATIVE

## 2012-05-31 ENCOUNTER — Encounter (HOSPITAL_COMMUNITY): Payer: Self-pay | Admitting: Emergency Medicine

## 2012-05-31 ENCOUNTER — Emergency Department (HOSPITAL_COMMUNITY)
Admission: EM | Admit: 2012-05-31 | Discharge: 2012-05-31 | Disposition: A | Discharge status: Home / Self Care | Payer: Self-pay | Attending: Emergency Medicine | Admitting: Emergency Medicine

## 2012-05-31 DIAGNOSIS — R350 Frequency of micturition: Secondary | ICD-10-CM | POA: Insufficient documentation

## 2012-05-31 DIAGNOSIS — M549 Dorsalgia, unspecified: Secondary | ICD-10-CM | POA: Insufficient documentation

## 2012-05-31 DIAGNOSIS — R631 Polydipsia: Secondary | ICD-10-CM | POA: Insufficient documentation

## 2012-05-31 DIAGNOSIS — Z3202 Encounter for pregnancy test, result negative: Secondary | ICD-10-CM | POA: Insufficient documentation

## 2012-05-31 DIAGNOSIS — R42 Dizziness and giddiness: Secondary | ICD-10-CM | POA: Insufficient documentation

## 2012-05-31 DIAGNOSIS — B9789 Other viral agents as the cause of diseases classified elsewhere: Secondary | ICD-10-CM | POA: Insufficient documentation

## 2012-05-31 DIAGNOSIS — J45909 Unspecified asthma, uncomplicated: Secondary | ICD-10-CM | POA: Insufficient documentation

## 2012-05-31 DIAGNOSIS — Z8632 Personal history of gestational diabetes: Secondary | ICD-10-CM | POA: Insufficient documentation

## 2012-05-31 DIAGNOSIS — R5381 Other malaise: Secondary | ICD-10-CM | POA: Insufficient documentation

## 2012-05-31 DIAGNOSIS — R51 Headache: Secondary | ICD-10-CM | POA: Insufficient documentation

## 2012-05-31 DIAGNOSIS — B349 Viral infection, unspecified: Secondary | ICD-10-CM

## 2012-05-31 LAB — CBC WITH DIFFERENTIAL/PLATELET
Basophils Absolute: 0 10*3/uL (ref 0.0–0.1)
Eosinophils Absolute: 0.3 10*3/uL (ref 0.0–0.7)
Eosinophils Relative: 2 % (ref 0–5)
HCT: 37.2 % (ref 36.0–46.0)
Lymphocytes Relative: 23 % (ref 12–46)
MCH: 25.2 pg — ABNORMAL LOW (ref 26.0–34.0)
MCV: 77.3 fL — ABNORMAL LOW (ref 78.0–100.0)
Monocytes Absolute: 0.8 10*3/uL (ref 0.1–1.0)
RDW: 15.7 % — ABNORMAL HIGH (ref 11.5–15.5)
WBC: 15.1 10*3/uL — ABNORMAL HIGH (ref 4.0–10.5)

## 2012-05-31 LAB — BASIC METABOLIC PANEL
CO2: 25 mEq/L (ref 19–32)
Calcium: 9.2 mg/dL (ref 8.4–10.5)
Creatinine, Ser: 0.67 mg/dL (ref 0.50–1.10)
Glucose, Bld: 147 mg/dL — ABNORMAL HIGH (ref 70–99)

## 2012-05-31 LAB — URINALYSIS, ROUTINE W REFLEX MICROSCOPIC
Bilirubin Urine: NEGATIVE
Hgb urine dipstick: NEGATIVE
Ketones, ur: NEGATIVE mg/dL
Nitrite: NEGATIVE
Urobilinogen, UA: 0.2 mg/dL (ref 0.0–1.0)

## 2012-05-31 LAB — GLUCOSE, CAPILLARY: Glucose-Capillary: 164 mg/dL — ABNORMAL HIGH (ref 70–99)

## 2012-05-31 MED ORDER — METOCLOPRAMIDE HCL 5 MG/ML IJ SOLN
10.0000 mg | Freq: Once | INTRAMUSCULAR | Status: AC
  Administered 2012-05-31: 10 mg via INTRAVENOUS
  Filled 2012-05-31: qty 2

## 2012-05-31 MED ORDER — KETOROLAC TROMETHAMINE 30 MG/ML IJ SOLN
30.0000 mg | Freq: Once | INTRAMUSCULAR | Status: AC
  Administered 2012-05-31: 30 mg via INTRAVENOUS
  Filled 2012-05-31: qty 1

## 2012-05-31 MED ORDER — IBUPROFEN 600 MG PO TABS: 600.0000 mg | ORAL_TABLET | Freq: Four times a day (QID) | ORAL | Status: DC | PRN

## 2012-05-31 MED ORDER — SODIUM CHLORIDE 0.9 % IV SOLN
Freq: Once | INTRAVENOUS | Status: AC
  Administered 2012-05-31: 20 mL/h via INTRAVENOUS

## 2012-05-31 NOTE — ED Notes (Signed)
Pt reports dizziness and headaches that last for 2-3 hours for 7 days. And reports " I just don't feel right". Pt also reports lower back pain but denies urinary freq or burning. Pt NAD.

## 2012-05-31 NOTE — ED Notes (Signed)
Pt sts intermittent headache x 1 week.  Sts flank pain and increased urinary frequency x 2 days.  Pain score 10/10.  Pt sts she has been having "chills" and been very "emotional."

## 2012-05-31 NOTE — ED Notes (Signed)
Notified Pt that urine is needed.   

## 2012-05-31 NOTE — ED Notes (Signed)
MD at bedside. 

## 2012-05-31 NOTE — ED Notes (Signed)
Pt escorted to discharge window. Verbalized understanding discharge instructions. In no acute distress.   

## 2012-06-01 NOTE — ED Provider Notes (Signed)
History     CSN: [id number]  Arrival date & time 05/31/12  1451   First MD Initiated Contact with Patient 05/31/12 1545      Chief Complaint  Patient presents with  . Dizziness  . Headache    (Consider location/radiation/quality/duration/timing/severity/associated sxs/prior treatment) HPI Comments: PT comes in with cc of back pain, headaches. Pt states that she has been feeling unwell for the past 5 days. No neuro complains with the headaches or the back pain. Pt has been working daily - and had no time to get evaluated. She states that she doesn't feel well. No hx of headaches in the past, has had back pains before. She also states that sheh as been more fatigued, and having increased hunger, and increased urination and thirst. No hx of DM.   Patient is a 37 y.o. female presenting with headaches. The history is provided by the patient.  Headache Associated symptoms: back pain   Associated symptoms: no abdominal pain, no nausea, no neck pain and no vomiting     Past Medical History  Diagnosis Date  . Asthma   . Gestational diabetes     Past Surgical History  Procedure Laterality Date  . Breast reduction surgery      No family history on file.  History  Substance Use Topics  . Smoking status: Never Smoker   . Smokeless tobacco: Not on file  . Alcohol Use: No    OB History   Grav Para Term Preterm Abortions TAB SAB Ect Mult Living                  Review of Systems  Constitutional: Negative for activity change.  HENT: Negative for neck pain.   Respiratory: Negative for shortness of breath.   Cardiovascular: Negative for chest pain.  Gastrointestinal: Negative for nausea, vomiting and abdominal pain.  Genitourinary: Positive for frequency. Negative for dysuria.  Musculoskeletal: Positive for back pain.  Neurological: Positive for headaches.    Allergies  Review of patient's allergies indicates no known allergies.  Home Medications   Current Outpatient  Rx  Name  Route  Sig  Dispense  Refill  . ibuprofen (ADVIL,MOTRIN) 200 MG tablet   Oral   Take 200 mg by mouth every 6 (six) hours as needed. Pain         . Naproxen Sodium (ANAPROX PO)   Oral   Take 2 tablets by mouth daily. Patient used this medication for pain.         . pregabalin (LYRICA) 100 MG capsule   Oral   Take 100 mg by mouth 2 (two) times daily.         Marland Kitchen ibuprofen (ADVIL,MOTRIN) 600 MG tablet   Oral   Take 1 tablet (600 mg total) by mouth every 6 (six) hours as needed for pain.   30 tablet   0     BP 132/84  Pulse 72  Temp(Src) 97.8 F (36.6 C) (Oral)  Resp 14  SpO2 98%  Physical Exam  Constitutional: She is oriented to person, place, and time. She appears well-developed and well-nourished.  HENT:  Head: Normocephalic and atraumatic.  Eyes: EOM are normal. Pupils are equal, round, and reactive to light.  Neck: Neck supple.  Cardiovascular: Normal rate, regular rhythm and normal heart sounds.   No murmur heard. Pulmonary/Chest: Effort normal. No respiratory distress.  Abdominal: Soft. She exhibits no distension. There is no tenderness. There is no rebound and no guarding.  Musculoskeletal: She exhibits  no edema and no tenderness.  Pt has tenderness over the lumbar region No step offs, no erythema. Pt has 2+ patellar reflex bilaterally. Able to discriminate between sharp and dull. Able to ambulate  Neurological: She is alert and oriented to person, place, and time. No cranial nerve deficit. Coordination normal.  Skin: Skin is warm and dry.    ED Course  Procedures (including critical care time)  Labs Reviewed  URINALYSIS, ROUTINE W REFLEX MICROSCOPIC - Abnormal; Notable for the following:    Specific Gravity, Urine 1.036 (*)    All other components within normal limits  CBC WITH DIFFERENTIAL - Abnormal; Notable for the following:    WBC 15.1 (*)    MCV 77.3 (*)    MCH 25.2 (*)    RDW 15.7 (*)    Neutro Abs 10.5 (*)    All other  components within normal limits  BASIC METABOLIC PANEL - Abnormal; Notable for the following:    Glucose, Bld 147 (*)    All other components within normal limits  GLUCOSE, CAPILLARY - Abnormal; Notable for the following:    Glucose-Capillary 164 (*)    All other components within normal limits  URINE CULTURE   No results found.   1. Viral syndrome       MDM  Pt comes in with cc of back pain, headaches and some new onset diabetes type sx. Her Sugars are WNL. Her back pain and headaches have no redflags associated with them to warrant much imaging.  Not sure what the cause is. Suspect metabolic cause - and patient has pcp - so we ill request outpatient f/u  Derwood Kaplan, MD 06/01/12 1758

## 2012-06-02 LAB — URINE CULTURE: Colony Count: 60000

## 2012-06-03 LAB — POCT PREGNANCY, URINE: Preg Test, Ur: NEGATIVE

## 2012-08-23 ENCOUNTER — Emergency Department (HOSPITAL_COMMUNITY)
Admission: EM | Admit: 2012-08-23 | Discharge: 2012-08-23 | Disposition: A | Discharge status: Home / Self Care | Payer: Medicaid Other | Attending: Emergency Medicine | Admitting: Emergency Medicine

## 2012-08-23 ENCOUNTER — Encounter (HOSPITAL_COMMUNITY): Payer: Self-pay | Admitting: Cardiology

## 2012-08-23 DIAGNOSIS — J45909 Unspecified asthma, uncomplicated: Secondary | ICD-10-CM | POA: Insufficient documentation

## 2012-08-23 DIAGNOSIS — R1033 Periumbilical pain: Secondary | ICD-10-CM | POA: Insufficient documentation

## 2012-08-23 DIAGNOSIS — Z8632 Personal history of gestational diabetes: Secondary | ICD-10-CM | POA: Insufficient documentation

## 2012-08-23 DIAGNOSIS — K2990 Gastroduodenitis, unspecified, without bleeding: Secondary | ICD-10-CM | POA: Insufficient documentation

## 2012-08-23 DIAGNOSIS — M549 Dorsalgia, unspecified: Secondary | ICD-10-CM | POA: Insufficient documentation

## 2012-08-23 DIAGNOSIS — Z7982 Long term (current) use of aspirin: Secondary | ICD-10-CM | POA: Insufficient documentation

## 2012-08-23 DIAGNOSIS — Z791 Long term (current) use of non-steroidal anti-inflammatories (NSAID): Secondary | ICD-10-CM | POA: Insufficient documentation

## 2012-08-23 DIAGNOSIS — Z3202 Encounter for pregnancy test, result negative: Secondary | ICD-10-CM | POA: Insufficient documentation

## 2012-08-23 DIAGNOSIS — R109 Unspecified abdominal pain: Secondary | ICD-10-CM

## 2012-08-23 DIAGNOSIS — K2970 Gastritis, unspecified, without bleeding: Secondary | ICD-10-CM | POA: Insufficient documentation

## 2012-08-23 DIAGNOSIS — R112 Nausea with vomiting, unspecified: Secondary | ICD-10-CM | POA: Insufficient documentation

## 2012-08-23 LAB — COMPREHENSIVE METABOLIC PANEL
ALT: 10 U/L (ref 0–35)
BUN: 9 mg/dL (ref 6–23)
CO2: 25 mEq/L (ref 19–32)
Calcium: 9 mg/dL (ref 8.4–10.5)
Creatinine, Ser: 0.66 mg/dL (ref 0.50–1.10)
GFR calc Af Amer: >90 mL/min (ref >90)
GFR calc non Af Amer: >90 mL/min (ref >90)
Glucose, Bld: 176 mg/dL — ABNORMAL HIGH (ref 70–99)
Potassium: 3.3 mEq/L — ABNORMAL LOW (ref 3.5–5.1)
Total Protein: 7.6 g/dL (ref 6.0–8.3)

## 2012-08-23 LAB — CBC WITH DIFFERENTIAL/PLATELET
Basophils Absolute: 0 10*3/uL (ref 0.0–0.1)
Basophils Relative: 0 % (ref 0–1)
MCHC: 32.9 g/dL (ref 30.0–36.0)
Neutro Abs: 9 10*3/uL — ABNORMAL HIGH (ref 1.7–7.7)
Neutrophils Relative %: 70 % (ref 43–77)
RDW: 15.4 % (ref 11.5–15.5)

## 2012-08-23 LAB — URINALYSIS, ROUTINE W REFLEX MICROSCOPIC
Bilirubin Urine: NEGATIVE
Ketones, ur: NEGATIVE mg/dL
Nitrite: NEGATIVE
Specific Gravity, Urine: 1.017 (ref 1.005–1.030)
Urobilinogen, UA: 0.2 mg/dL (ref 0.0–1.0)
pH: 6 (ref 5.0–8.0)

## 2012-08-23 LAB — URINE MICROSCOPIC-ADD ON

## 2012-08-23 MED ORDER — FAMOTIDINE 20 MG PO TABS: 20.0000 mg | ORAL_TABLET | Freq: Two times a day (BID) | ORAL | Status: DC

## 2012-08-23 MED ORDER — HYDROCODONE-ACETAMINOPHEN 5-325 MG PO TABS: 1.0000 | ORAL_TABLET | ORAL | Status: DC | PRN

## 2012-08-23 MED ORDER — OMEPRAZOLE 20 MG PO CPDR: 20.0000 mg | DELAYED_RELEASE_CAPSULE | Freq: Every day | ORAL | Status: DC

## 2012-08-23 NOTE — ED Notes (Signed)
Pt reports for the past couple of days she has had generalized abd pain with nausea and some vomiting. States she has pain after eating, and also some sweating. Denies any urinary symptoms or burning. Also reports pain in her joints.

## 2012-08-23 NOTE — ED Notes (Signed)
Pt reports lower 2/10 cramping abdominal pain that increases to 10/10 15 minutes after eating. Pt reports this has been going on for the last several days. Reports nausea after eating appx 1 hour that resolves on its own. Reports 1 episode of vomiting this AM, but denies nausea at this time. Denies diarrhea, but reports loose stool x 3 days

## 2012-08-23 NOTE — ED Provider Notes (Signed)
History     CSN: [id number]  Arrival date & time 08/23/12  1531   First MD Initiated Contact with Patient 08/23/12 1646      Chief Complaint  Patient presents with  . Abdominal Pain  . Nausea    (Consider location/radiation/quality/duration/timing/severity/associated sxs/prior treatment) Patient is a 37 y.o. female presenting with abdominal pain. The history is provided by the patient. No language interpreter was used.  Abdominal Pain Pain location:  Periumbilical Pain quality: burning   Associated symptoms: nausea   Associated symptoms: no chills, no constipation, no dysuria, no fever, no shortness of breath and no vomiting   Associated symptoms comment:  Periumbilical abdominal pain that occurs after eating lasting 15-20 minutes. Nausea with vomiting. No change in her bowel movements and she denies melena. She reports the pain occurs without regard to the food she is eating. No pain at the present time. She states she has regular back pain that she takes NSAIDs daily and this has been her usual medication for several months.    Past Medical History  Diagnosis Date  . Asthma   . Gestational diabetes     Past Surgical History  Procedure Laterality Date  . Breast reduction surgery      History reviewed. No pertinent family history.  History  Substance Use Topics  . Smoking status: Never Smoker   . Smokeless tobacco: Not on file  . Alcohol Use: No    OB History   Grav Para Term Preterm Abortions TAB SAB Ect Mult Living                  Review of Systems  Constitutional: Negative for fever and chills.  HENT: Negative.   Respiratory: Negative.  Negative for shortness of breath.   Cardiovascular: Negative.   Gastrointestinal: Positive for nausea and abdominal pain. Negative for vomiting, constipation and blood in stool.  Genitourinary: Negative for dysuria.  Musculoskeletal: Negative.   Skin: Negative.   Neurological: Negative.  Negative for weakness.   Psychiatric/Behavioral: Negative for confusion.    Allergies  Review of patient's allergies indicates no known allergies.  Home Medications   Current Outpatient Rx  Name  Route  Sig  Dispense  Refill  . aspirin 325 MG tablet   Oral   Take 325 mg by mouth 2 (two) times daily.         Marland Kitchen ibuprofen (ADVIL,MOTRIN) 200 MG tablet   Oral   Take 200 mg by mouth every 6 (six) hours as needed. Pain         . medroxyPROGESTERone (DEPO-PROVERA) 150 MG/ML injection   Intramuscular   Inject 150 mg into the muscle every 3 (three) months.         . pregabalin (LYRICA) 75 MG capsule   Oral   Take 75 mg by mouth 3 (three) times daily. Take two tablets by mouth at bedtime and 1 tablet every morning           BP 137/88  Pulse 75  Temp(Src) 98.6 F (37 C) (Oral)  Resp 18  SpO2 99%  Physical Exam  Constitutional: She is oriented to person, place, and time. She appears well-developed and well-nourished. No distress.  Neck: Normal range of motion.  Cardiovascular: Normal rate.   No murmur heard. Pulmonary/Chest: Effort normal and breath sounds normal. She has no wheezes. She has no rales.  Abdominal: Soft. Bowel sounds are normal. She exhibits no mass. There is no tenderness. There is no rebound and no guarding.  Musculoskeletal: Normal range of motion.  Neurological: She is alert and oriented to person, place, and time.  Skin: Skin is warm and dry.  Psychiatric: She has a normal mood and affect.    ED Course  Procedures (including critical care time)  Labs Reviewed  CBC WITH DIFFERENTIAL - Abnormal; Notable for the following:    WBC 12.7 (*)    Hemoglobin 11.9 (*)    MCV 76.2 (*)    MCH 25.1 (*)    Neutro Abs 9.0 (*)    All other components within normal limits  COMPREHENSIVE METABOLIC PANEL - Abnormal; Notable for the following:    Potassium 3.3 (*)    Glucose, Bld 176 (*)    All other components within normal limits  URINALYSIS, ROUTINE W REFLEX MICROSCOPIC -  Abnormal; Notable for the following:    APPearance HAZY (*)    Leukocytes, UA SMALL (*)    All other components within normal limits  URINE MICROSCOPIC-ADD ON - Abnormal; Notable for the following:    Squamous Epithelial / LPF MANY (*)    Bacteria, UA FEW (*)    All other components within normal limits  URINE CULTURE  POCT PREGNANCY, URINE   Results for orders placed during the hospital encounter of 08/23/12  CBC WITH DIFFERENTIAL      Result Value Range   WBC 12.7 (*) 4.0 - 10.5 K/uL   RBC 4.75  3.87 - 5.11 MIL/uL   Hemoglobin 11.9 (*) 12.0 - 15.0 g/dL   HCT 16.1  09.6 - 04.5 %   MCV 76.2 (*) 78.0 - 100.0 fL   MCH 25.1 (*) 26.0 - 34.0 pg   MCHC 32.9  30.0 - 36.0 g/dL   RDW 40.9  81.1 - 91.4 %   Platelets 281  150 - 400 K/uL   Neutrophils Relative 70  43 - 77 %   Neutro Abs 9.0 (*) 1.7 - 7.7 K/uL   Lymphocytes Relative 22  12 - 46 %   Lymphs Abs 2.9  0.7 - 4.0 K/uL   Monocytes Relative 5  3 - 12 %   Monocytes Absolute 0.7  0.1 - 1.0 K/uL   Eosinophils Relative 2  0 - 5 %   Eosinophils Absolute 0.2  0.0 - 0.7 K/uL   Basophils Relative 0  0 - 1 %   Basophils Absolute 0.0  0.0 - 0.1 K/uL  COMPREHENSIVE METABOLIC PANEL      Result Value Range   Sodium 139  135 - 145 mEq/L   Potassium 3.3 (*) 3.5 - 5.1 mEq/L   Chloride 104  96 - 112 mEq/L   CO2 25  19 - 32 mEq/L   Glucose, Bld 176 (*) 70 - 99 mg/dL   BUN 9  6 - 23 mg/dL   Creatinine, Ser 7.82  0.50 - 1.10 mg/dL   Calcium 9.0  8.4 - 95.6 mg/dL   Total Protein 7.6  6.0 - 8.3 g/dL   Albumin 3.5  3.5 - 5.2 g/dL   AST 14  0 - 37 U/L   ALT 10  0 - 35 U/L   Alkaline Phosphatase 86  39 - 117 U/L   Total Bilirubin 0.3  0.3 - 1.2 mg/dL   GFR calc non Af Amer >90  >90 mL/min   GFR calc Af Amer >90  >90 mL/min  URINALYSIS, ROUTINE W REFLEX MICROSCOPIC      Result Value Range   Color, Urine YELLOW  YELLOW   APPearance HAZY (*)  CLEAR   Specific Gravity, Urine 1.017  1.005 - 1.030   pH 6.0  5.0 - 8.0   Glucose, UA NEGATIVE   NEGATIVE mg/dL   Hgb urine dipstick NEGATIVE  NEGATIVE   Bilirubin Urine NEGATIVE  NEGATIVE   Ketones, ur NEGATIVE  NEGATIVE mg/dL   Protein, ur NEGATIVE  NEGATIVE mg/dL   Urobilinogen, UA 0.2  0.0 - 1.0 mg/dL   Nitrite NEGATIVE  NEGATIVE   Leukocytes, UA SMALL (*) NEGATIVE  URINE MICROSCOPIC-ADD ON      Result Value Range   Squamous Epithelial / LPF MANY (*) RARE   WBC, UA 3-6  <3 WBC/hpf   Bacteria, UA FEW (*) RARE   Urine-Other MUCOUS PRESENT    POCT PREGNANCY, URINE      Result Value Range   Preg Test, Ur NEGATIVE  NEGATIVE    No results found.   No diagnosis found.  1. NSAID gastritis  MDM  No pain currently. Normal lab studies. Suspect patient's use of NSAIDs are contributing to her symptoms and will recommend cessation and use of prilosec/antacids.        Arnoldo Hooker, PA-C 08/23/12 1840

## 2012-08-23 NOTE — ED Provider Notes (Signed)
Medical screening examination/treatment/procedure(s) were performed by non-physician practitioner and as supervising physician I was immediately available for consultation/collaboration.   Gwyneth Sprout, MD 08/23/12 [phone]

## 2012-08-24 LAB — URINE CULTURE: Colony Count: 4000

## 2012-09-18 ENCOUNTER — Emergency Department (HOSPITAL_COMMUNITY)
Admission: EM | Admit: 2012-09-18 | Discharge: 2012-09-18 | Disposition: A | Discharge status: Home / Self Care | Payer: Medicaid Other | Attending: Emergency Medicine | Admitting: Emergency Medicine

## 2012-09-18 ENCOUNTER — Encounter (HOSPITAL_COMMUNITY): Payer: Self-pay | Admitting: *Deleted

## 2012-09-18 ENCOUNTER — Emergency Department (HOSPITAL_COMMUNITY): Payer: Medicaid Other

## 2012-09-18 DIAGNOSIS — J3489 Other specified disorders of nose and nasal sinuses: Secondary | ICD-10-CM | POA: Insufficient documentation

## 2012-09-18 DIAGNOSIS — R61 Generalized hyperhidrosis: Secondary | ICD-10-CM | POA: Insufficient documentation

## 2012-09-18 DIAGNOSIS — Z792 Long term (current) use of antibiotics: Secondary | ICD-10-CM | POA: Insufficient documentation

## 2012-09-18 DIAGNOSIS — J45901 Unspecified asthma with (acute) exacerbation: Secondary | ICD-10-CM

## 2012-09-18 DIAGNOSIS — Z3202 Encounter for pregnancy test, result negative: Secondary | ICD-10-CM | POA: Insufficient documentation

## 2012-09-18 DIAGNOSIS — E876 Hypokalemia: Secondary | ICD-10-CM | POA: Insufficient documentation

## 2012-09-18 DIAGNOSIS — J069 Acute upper respiratory infection, unspecified: Secondary | ICD-10-CM | POA: Insufficient documentation

## 2012-09-18 DIAGNOSIS — Z7982 Long term (current) use of aspirin: Secondary | ICD-10-CM | POA: Insufficient documentation

## 2012-09-18 DIAGNOSIS — R6883 Chills (without fever): Secondary | ICD-10-CM | POA: Insufficient documentation

## 2012-09-18 DIAGNOSIS — Z8632 Personal history of gestational diabetes: Secondary | ICD-10-CM | POA: Insufficient documentation

## 2012-09-18 DIAGNOSIS — Z79899 Other long term (current) drug therapy: Secondary | ICD-10-CM | POA: Insufficient documentation

## 2012-09-18 LAB — URINALYSIS, ROUTINE W REFLEX MICROSCOPIC
Bilirubin Urine: NEGATIVE
Glucose, UA: NEGATIVE mg/dL
Hgb urine dipstick: NEGATIVE
Ketones, ur: NEGATIVE mg/dL
Leukocytes, UA: NEGATIVE
Nitrite: NEGATIVE
Protein, ur: NEGATIVE mg/dL
Specific Gravity, Urine: 1.031 — ABNORMAL HIGH (ref 1.005–1.030)
Urobilinogen, UA: 0.2 mg/dL (ref 0.0–1.0)
pH: 5.5 (ref 5.0–8.0)

## 2012-09-18 LAB — BASIC METABOLIC PANEL
BUN: 12 mg/dL (ref 6–23)
CO2: 24 mEq/L (ref 19–32)
Calcium: 9.1 mg/dL (ref 8.4–10.5)
Chloride: 103 mEq/L (ref 96–112)
Creatinine, Ser: 0.64 mg/dL (ref 0.50–1.10)
GFR calc Af Amer: >90 mL/min (ref >90)
GFR calc non Af Amer: >90 mL/min (ref >90)
Glucose, Bld: 97 mg/dL (ref 70–99)
Potassium: 3.2 mEq/L — ABNORMAL LOW (ref 3.5–5.1)
Sodium: 138 mEq/L (ref 135–145)

## 2012-09-18 LAB — CBC WITH DIFFERENTIAL/PLATELET
Basophils Absolute: 0 10*3/uL (ref 0.0–0.1)
Basophils Relative: 0 % (ref 0–1)
Eosinophils Absolute: 0.2 10*3/uL (ref 0.0–0.7)
Eosinophils Relative: 2 % (ref 0–5)
HCT: 36.5 % (ref 36.0–46.0)
Hemoglobin: 11.9 g/dL — ABNORMAL LOW (ref 12.0–15.0)
Lymphocytes Relative: 25 % (ref 12–46)
Lymphs Abs: 2.3 10*3/uL (ref 0.7–4.0)
MCH: 25.2 pg — ABNORMAL LOW (ref 26.0–34.0)
MCHC: 32.6 g/dL (ref 30.0–36.0)
MCV: 77.3 fL — ABNORMAL LOW (ref 78.0–100.0)
Monocytes Absolute: 0.9 10*3/uL (ref 0.1–1.0)
Monocytes Relative: 10 % (ref 3–12)
Neutro Abs: 5.9 10*3/uL (ref 1.7–7.7)
Neutrophils Relative %: 64 % (ref 43–77)
Platelets: 216 10*3/uL (ref 150–400)
RBC: 4.72 MIL/uL (ref 3.87–5.11)
RDW: 15.4 % (ref 11.5–15.5)
WBC: 9.3 10*3/uL (ref 4.0–10.5)

## 2012-09-18 LAB — PREGNANCY, URINE: Preg Test, Ur: NEGATIVE

## 2012-09-18 LAB — TROPONIN I: Troponin I: <0.3 ng/mL (ref <0.30)

## 2012-09-18 MED ORDER — ALBUTEROL SULFATE (5 MG/ML) 0.5% IN NEBU: 2.5000 mg | INHALATION_SOLUTION | RESPIRATORY_TRACT | Status: DC | PRN

## 2012-09-18 MED ORDER — AZITHROMYCIN 250 MG PO TABS: 250.0000 mg | ORAL_TABLET | Freq: Every day | ORAL | Status: DC

## 2012-09-18 MED ORDER — ALBUTEROL SULFATE (5 MG/ML) 0.5% IN NEBU
2.5000 mg | INHALATION_SOLUTION | Freq: Once | RESPIRATORY_TRACT | Status: AC
  Administered 2012-09-18: 2.5 mg via RESPIRATORY_TRACT
  Filled 2012-09-18: qty 0.5

## 2012-09-18 MED ORDER — IPRATROPIUM BROMIDE 0.02 % IN SOLN
0.5000 mg | Freq: Once | RESPIRATORY_TRACT | Status: AC
  Administered 2012-09-18: 0.5 mg via RESPIRATORY_TRACT
  Filled 2012-09-18: qty 2.5

## 2012-09-18 NOTE — ED Provider Notes (Signed)
History     CSN: [id number]  Arrival date & time 09/18/12  1191   First MD Initiated Contact with Patient 09/18/12 [phone]      Chief Complaint  Patient presents with  . Chest Pain  . Asthma    (Consider location/radiation/quality/duration/timing/severity/associated sxs/prior treatment) HPI Pt is a 37yo female with hx of asthma c/o productive cough associated with fevers, nasal congestion, and left sided headache since Friday.  Reports severe sweating with nausea since last night, and chest pain that started around 0400 this morning.  Pain is 10/10, unable to describe, worse with cough.  Has tried aspirin, Nyquil, cold & flu liquids, albuterol inhaler every 4 hours, and herbal tea with minimal relief.  Denies vomiting or diarrhea.  Denies abd pain. Does report hx of peptic ulcers and knows she should not be taking aspirin but does take pepcid for ulcers.  No known sick contacts or recent travel.   Past Medical History  Diagnosis Date  . Asthma   . Gestational diabetes     Past Surgical History  Procedure Laterality Date  . Breast reduction surgery      History reviewed. No pertinent family history.  History  Substance Use Topics  . Smoking status: Never Smoker   . Smokeless tobacco: Not on file  . Alcohol Use: No    OB History   Grav Para Term Preterm Abortions TAB SAB Ect Mult Living                  Review of Systems  Constitutional: Positive for chills and diaphoresis. Negative for fever.  HENT: Positive for congestion. Negative for sore throat, trouble swallowing and voice change.   Respiratory: Positive for cough, shortness of breath and wheezing.   Cardiovascular: Positive for chest pain (with cough).  Gastrointestinal: Negative for nausea, vomiting and diarrhea.  All other systems reviewed and are negative.    Allergies  Review of patient's allergies indicates no known allergies.  Home Medications   Current Outpatient Rx  Name  Route  Sig  Dispense   Refill  . albuterol (PROVENTIL HFA;VENTOLIN HFA) 108 (90 BASE) MCG/ACT inhaler   Inhalation   Inhale 2 puffs into the lungs every 6 (six) hours as needed for wheezing.         Marland Kitchen aspirin 325 MG tablet   Oral   Take 325 mg by mouth 2 (two) times daily.         Marland Kitchen dextromethorphan 7.5 MG/5ML SYRP   Oral   Take 7.5 mg by mouth every 6 (six) hours as needed.         . famotidine (PEPCID) 20 MG tablet   Oral   Take 1 tablet (20 mg total) by mouth 2 (two) times daily.   30 tablet   0   . fluticasone (FLONASE) 50 MCG/ACT nasal spray   Nasal   Place 2 sprays into the nose daily.         Marland Kitchen HYDROcodone-acetaminophen (NORCO/VICODIN) 5-325 MG per tablet   Oral   Take 1 tablet by mouth every 4 (four) hours as needed for pain.   12 tablet   0   . ibuprofen (ADVIL,MOTRIN) 200 MG tablet   Oral   Take 200 mg by mouth every 6 (six) hours as needed for pain.         Marland Kitchen omeprazole (PRILOSEC) 20 MG capsule   Oral   Take 1 capsule (20 mg total) by mouth daily. TAKE ONE CAPSULE TWICE DAILY  FOR 7 DAYS, THEN ONE DAILY AFTER THAT   30 capsule   0   . Pseudoephedrine-APAP-DM (DAYQUIL PO)   Oral   Take 1 capsule by mouth.         Marland Kitchen albuterol (PROVENTIL) (5 MG/ML) 0.5% nebulizer solution   Nebulization   Take 0.5 mLs (2.5 mg total) by nebulization every 4 (four) hours as needed for wheezing or shortness of breath.   20 mL   12   . azithromycin (ZITHROMAX) 250 MG tablet   Oral   Take 1 tablet (250 mg total) by mouth daily. Take first 2 tablets together, then 1 every day until finished.   6 tablet   0   . medroxyPROGESTERone (DEPO-PROVERA) 150 MG/ML injection   Intramuscular   Inject 150 mg into the muscle every 3 (three) months.           BP 126/76  Pulse 92  Temp(Src) 98.5 F (36.9 C) (Oral)  Resp 20  SpO2 97%  LMP 09/16/2012  Physical Exam  Nursing note and vitals reviewed. Constitutional: She appears well-developed and well-nourished. No distress.  Obese female  laying in exam bed with socks off, stating she is hot  HENT:  Head: Normocephalic and atraumatic.  Right Ear: Hearing, tympanic membrane, external ear and ear canal normal.  Left Ear: Hearing, tympanic membrane, external ear and ear canal normal.  Nose: Mucosal edema present. No rhinorrhea.  Mouth/Throat: Uvula is midline and mucous membranes are normal. Posterior oropharyngeal erythema present. No oropharyngeal exudate, posterior oropharyngeal edema or tonsillar abscesses.  Eyes: Conjunctivae and EOM are normal. Pupils are equal, round, and reactive to light. Right eye exhibits no discharge. Left eye exhibits no discharge. No scleral icterus.  Neck: Normal range of motion. Neck supple.  No nuchal rigidity or meningeal signs   Cardiovascular: Normal rate, regular rhythm and normal heart sounds.   Pulmonary/Chest: Effort normal. No respiratory distress. She has wheezes. She has rales. She exhibits no tenderness.  Slight SOB with speech, able to speak in full sentences  Abdominal: Soft. Bowel sounds are normal. She exhibits no distension and no mass. There is no tenderness. There is no rebound and no guarding.  Musculoskeletal: Normal range of motion.  Neurological: She is alert. She has normal strength. No cranial nerve deficit or sensory deficit. Coordination and gait normal. GCS eye subscore is 4. GCS verbal subscore is 5. GCS motor subscore is 6.  Skin: Skin is warm and dry. She is not diaphoretic.    ED Course  Procedures (including critical care time)  Labs Reviewed  CBC WITH DIFFERENTIAL - Abnormal; Notable for the following:    Hemoglobin 11.9 (*)    MCV 77.3 (*)    MCH 25.2 (*)    All other components within normal limits  BASIC METABOLIC PANEL - Abnormal; Notable for the following:    Potassium 3.2 (*)    All other components within normal limits  URINALYSIS, ROUTINE W REFLEX MICROSCOPIC - Abnormal; Notable for the following:    APPearance CLOUDY (*)    Specific Gravity,  Urine 1.031 (*)    All other components within normal limits  PREGNANCY, URINE  TROPONIN I   Dg Chest 2 View  09/18/2012   *RADIOLOGY REPORT*  Clinical Data: Mid chest pain.  Cough.  Congestion.  History of asthma.  CHEST - 2 VIEW  Comparison: None  Findings: Lateral view degraded by patient arm position.  Mild right hemidiaphragm elevation. Midline trachea.  Normal heart size and mediastinal  contours. No pleural effusion or pneumothorax. Clear lungs.  IMPRESSION: No acute cardiopulmonary disease.   Original Report Authenticated By: Jeronimo Greaves, M.D.     Date: 09/18/2012  Rate: 76  Rhythm: normal sinus rhythm  QRS Axis: normal  Intervals: normal  ST/T Wave abnormalities: normal  Conduction Disutrbances:none  Narrative Interpretation:   Old EKG Reviewed: none available   1. URI, acute   2. Asthma, unspecified asthma severity, with acute exacerbation   3. Hypokalemia       MDM  Pt with hx of asthma c/o chest pain with cough since Friday and severe sweating since last night.  Gave pt neb tx of albuterol and atrovent in ED.  Lungs sounds improved.  Pt states she still feels hot and tired.  Pt is afebrile and in NAD.  Vitals: nl  CXR: nl EKG: nl Troponin: neg CBC: unremarkable BMP: hypokalemia, otherwise unremarkable UA: no leukocytes or bacteria  Urine preg: neg  No further workup needed at this time.  Pt's lung sounds improved with neb tx. With hx of asthma will prescribe antibiotic for URI  Rx: azithromycin and albuterol neb solution (pt states she has a machine at home). Discharged home in stable condition. Advised pt to f/u with pcp in 2 days if symptoms not improving, sooner if more worrisome symptoms arise.  Vitals: unremarkable. Discharged in stable condition.    Discussed pt with attending during ED encounter.          Junius Finner, PA-C 09/19/12 1353

## 2012-09-18 NOTE — ED Notes (Addendum)
Pt hx of asthma, reports congested cough and fevers since Friday, intermittent left sided headache since Friday. Reports severe sweating since last night with nausea. Chest pain started around 0400 today. Pain 10/10. No wheezing heard in lung fields, but pt gets slight SOB with speaking.

## 2012-09-18 NOTE — ED Notes (Signed)
Patient transported to X-ray 

## 2012-09-19 NOTE — ED Provider Notes (Signed)
Medical screening examination/treatment/procedure(s) were performed by non-physician practitioner and as supervising physician I was immediately available for consultation/collaboration.   Levina Boyack R Kushal Saunders, MD 09/19/12 1611 

## 2013-01-08 ENCOUNTER — Ambulatory Visit: Payer: Medicaid Other | Attending: Internal Medicine | Admitting: Internal Medicine

## 2013-01-08 VITALS — BP 138/89 | HR 62 | Temp 97.7°F | Resp 17 | Wt 264.4 lb

## 2013-01-08 DIAGNOSIS — Z23 Encounter for immunization: Secondary | ICD-10-CM

## 2013-01-08 DIAGNOSIS — M797 Fibromyalgia: Secondary | ICD-10-CM

## 2013-01-08 DIAGNOSIS — IMO0001 Reserved for inherently not codable concepts without codable children: Secondary | ICD-10-CM | POA: Insufficient documentation

## 2013-01-08 LAB — CBC
HCT: 39.1 % (ref 36.0–46.0)
Hemoglobin: 12.7 g/dL (ref 12.0–15.0)
MCH: 25 pg — ABNORMAL LOW (ref 26.0–34.0)
MCHC: 32.5 g/dL (ref 30.0–36.0)
MCV: 77.1 fL — ABNORMAL LOW (ref 78.0–100.0)
RBC: 5.07 MIL/uL (ref 3.87–5.11)

## 2013-01-08 MED ORDER — GABAPENTIN 300 MG PO CAPS: 300.0000 mg | ORAL_CAPSULE | Freq: Three times a day (TID) | ORAL | Status: DC

## 2013-01-08 NOTE — Progress Notes (Signed)
Patient here for back pack and right hip pain Also complains of bilateral thumb pain

## 2013-01-08 NOTE — Progress Notes (Signed)
Patient ID: Lisa Moreno, female   DOB: 04/08/1976, 37 y.o.   MRN: [id number]  . Patient Demographics  Lisa Moreno, is a 37 y.o. female  CSN: [id number]  MRN: [id number]  DOB - [birth year]  Outpatient Primary MD for the patient is Lisa Lesch, MD   With History of -  Past Medical History  Diagnosis Date  . Asthma   . Gestational diabetes       Past Surgical History  Procedure Laterality Date  . Breast reduction surgery      in for   Chief Complaint  Patient presents with  . Back Pain     HPI  Lisa Moreno  is a 37 y.o. 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 Her main concern is her right knee and her right hand which has been bothering her, more so she has diffuse pains and aches all over. She also complains of some right-sided hip pain which happens from time to time. She denies any fever chills, denies any cough phlegm chest pain or palpitations. No shortness of breath    Review of Systems    In addition to the HPI above,  No Fever-chills, No Headache, No changes with Vision or hearing, No problems swallowing food or Liquids, No Chest pain, Cough or Shortness of Breath, No Abdominal pain, No Nausea or Vommitting, Bowel movements are regular, No Blood in stool or Urine, No dysuria, No new skin rashes or bruises, No new joints pains-aches,  No new weakness, tingling, numbness in any extremity, No recent weight gain or loss, No polyuria, polydypsia or polyphagia, No significant Mental Stressors.  A full 10 point Review of Systems was done, except as stated above, all other Review of Systems were negative.   Social History History  Substance Use Topics  . Smoking  status: Never Smoker   . Smokeless tobacco: Not on file  . Alcohol Use: No      Family History Diabetes mellitus type 2  Prior to Admission medications   Medication Sig Start Date End Date Taking? Authorizing Provider  albuterol (PROVENTIL HFA;VENTOLIN HFA) 108 (90 BASE) MCG/ACT inhaler Inhale 2 puffs into the lungs every 6 (six) hours as needed for wheezing.   Yes Historical Provider, MD  albuterol (PROVENTIL) (5 MG/ML) 0.5% nebulizer solution Take 0.5 mLs (2.5 mg total) by nebulization every 4 (four) hours as needed for wheezing or shortness of breath. 09/18/12  Yes Junius Finner, PA-C  famotidine (PEPCID) 20 MG tablet Take 1 tablet (20 mg total) by mouth 2 (two) times daily. 08/23/12  Yes Shari A Upstill, PA-C  fluticasone (FLONASE) 50 MCG/ACT nasal spray Place 2 sprays into the nose daily.   Yes Historical Provider, MD  HYDROcodone-acetaminophen (NORCO/VICODIN) 5-325 MG per tablet Take 1 tablet by mouth every 4 (four) hours as needed for pain. 08/23/12  Yes Shari A Upstill, PA-C  loratadine (CLARITIN) 10 MG tablet Take 10 mg by mouth daily.   Yes Historical Provider, MD  medroxyPROGESTERone (DEPO-PROVERA) 150 MG/ML injection Inject 150 mg into the muscle every 3 (three) months.   Yes Historical Provider, MD  Multiple Vitamin (MULTIVITAMIN) tablet Take 1 tablet by mouth daily.   Yes Historical Provider, MD  naproxen (NAPROSYN) 500 MG tablet Take  500 mg by mouth 2 (two) times daily with a meal.   Yes Historical Provider, MD  omeprazole (PRILOSEC) 20 MG capsule Take 1 capsule (20 mg total) by mouth daily. TAKE ONE CAPSULE TWICE DAILY FOR 7 DAYS, THEN ONE DAILY AFTER THAT 08/23/12  Yes Shari A Upstill, PA-C  aspirin 325 MG tablet Take 325 mg by mouth 2 (two) times daily.    Historical Provider, MD  azithromycin (ZITHROMAX) 250 MG tablet Take 1 tablet (250 mg total) by mouth daily. Take first 2 tablets together, then 1 every day until finished. 09/18/12   Junius Finner, PA-C  gabapentin (NEURONTIN) 300  MG capsule Take 1 capsule (300 mg total) by mouth 3 (three) times daily. 01/08/13   Leroy Sea, MD    No Known Allergies  Physical Exam  Vitals  Blood pressure 138/89, pulse 62, temperature 97.7 F (36.5 C), resp. rate 17, weight 264 lb 6.4 oz (119.931 kg), SpO2 100.00%.   1. General morbidly obese young Hispanic female sitting on clinic examination table in no apparent distress,     2. Normal affect and insight, Not Suicidal or Homicidal, Awake Alert, Oriented X 3.  3. No F.N deficits, ALL C.Nerves Intact, Strength 5/5 all 4 extremities, Sensation intact all 4 extremities, Plantars down going.  4. Ears and Eyes appear Normal, Conjunctivae clear, PERRLA. Moist Oral Mucosa.  5. Supple Neck, No JVD, No cervical lymphadenopathy appriciated, No Carotid Bruits.  6. Symmetrical Chest wall movement, Good air movement bilaterally, CTAB.  7. RRR, No Gallops, Rubs or Murmurs, No Parasternal Heave.  8. Positive Bowel Sounds, Abdomen Soft, Non tender, No organomegaly appriciated,No rebound -guarding or rigidity.  9.  No Cyanosis, Normal Skin Turgor, No Skin Rash or Bruise.  10. Good muscle tone,  joints appear normal , no effusions, Normal ROM.  11. No Palpable Lymph Nodes in Neck or Axillae     Data Review  CBC No results found for this basename: WBC, HGB, HCT, PLT, MCV, MCH, MCHC, RDW, NEUTRABS, LYMPHSABS, MONOABS, EOSABS, BASOSABS, BANDABS, BANDSABD,  in the last 168 hours ------------------------------------------------------------------------------------------------------------------  Chemistries  No results found for this basename: NA, K, CL, CO2, GLUCOSE, BUN, CREATININE, GFRCGP, CALCIUM, MG, AST, ALT, ALKPHOS, BILITOT,  in the last 168 hours ------------------------------------------------------------------------------------------------------------------ CrCl is unknown because both a height and weight (above a minimum accepted value) are required for this  calculation. ------------------------------------------------------------------------------------------------------------------ No results found for this basename: TSH, T4TOTAL, FREET3, T3FREE, THYROIDAB,  in the last 72 hours   Coagulation profile No results found for this basename: INR, PROTIME,  in the last 168 hours ------------------------------------------------------------------------------------------------------------------- No results found for this basename: DDIMER,  in the last 72 hours -------------------------------------------------------------------------------------------------------------------  Cardiac Enzymes No results found for this basename: CK, CKMB, TROPONINI, MYOGLOBIN,  in the last 168 hours ------------------------------------------------------------------------------------------------------------------ No components found with this basename: POCBNP,    ---------------------------------------------------------------------------------------------------------------  Urinalysis    Component Value Date/Time   COLORURINE YELLOW 09/18/2012 1001   APPEARANCEUR CLOUDY* 09/18/2012 1001   LABSPEC 1.031* 09/18/2012 1001   PHURINE 5.5 09/18/2012 1001   GLUCOSEU NEGATIVE 09/18/2012 1001   HGBUR NEGATIVE 09/18/2012 1001   BILIRUBINUR NEGATIVE 09/18/2012 1001   KETONESUR NEGATIVE 09/18/2012 1001   PROTEINUR NEGATIVE 09/18/2012 1001   UROBILINOGEN 0.2 09/18/2012 1001   NITRITE NEGATIVE 09/18/2012 1001   LEUKOCYTESUR NEGATIVE 09/18/2012 1001       Assessment and plan  1. Pain in the right wrist, right hip and right knee - likely DJD versus fibromyalgia related, she still complaining  of diffuse pains and aches, Will check x-ray of the right hand, hip and knee, also rheumatoid factor anti-CCP anti-body will be checked. Patient takes the Provera shots for contraception and says she's not pregnant.   2. Fibromyalgia. She stopped the insulin data, has diffuse pains and aches,  she says recommended sick, we'll switch her to Neurontin and monitor.   Routine health maintenance.  Screening labs. CBC, CMP, TSH, A1c, lipid panel ordered  She refused flu shot, tetanus shot will be given. Referred for Pap smear.     Leroy Sea M.D on 01/08/2013 at 5:35 PM

## 2013-01-09 LAB — COMPLETE METABOLIC PANEL WITH GFR
Alkaline Phosphatase: 99 U/L (ref 39–117)
BUN: 12 mg/dL (ref 6–23)
CO2: 26 mEq/L (ref 19–32)
Creat: 0.63 mg/dL (ref 0.50–1.10)
GFR, Est African American: >89 mL/min
GFR, Est Non African American: >89 mL/min
Glucose, Bld: 171 mg/dL — ABNORMAL HIGH (ref 70–99)
Total Bilirubin: 0.3 mg/dL (ref 0.3–1.2)
Total Protein: 7.6 g/dL (ref 6.0–8.3)

## 2013-01-09 LAB — LIPID PANEL
Cholesterol: 150 mg/dL (ref 0–200)
HDL: 33 mg/dL — ABNORMAL LOW (ref >39)
Total CHOL/HDL Ratio: 4.5 Ratio

## 2013-01-09 LAB — RHEUMATOID FACTOR: Rhuematoid fact SerPl-aCnc: <10 IU/mL (ref <=14)

## 2013-01-16 NOTE — Progress Notes (Signed)
Quick Note:  Please have patient come back to the clinic immediately in 1-2 days for further workup persistent leukocytosis including infection workup. ______

## 2013-01-23 ENCOUNTER — Encounter: Payer: Self-pay | Admitting: Obstetrics & Gynecology

## 2013-02-05 ENCOUNTER — Other Ambulatory Visit: Payer: Self-pay | Admitting: Emergency Medicine

## 2013-02-05 ENCOUNTER — Telehealth: Payer: Self-pay | Admitting: Internal Medicine

## 2013-02-05 MED ORDER — OMEPRAZOLE 20 MG PO CPDR: 20.0000 mg | DELAYED_RELEASE_CAPSULE | Freq: Every day | ORAL | Status: DC

## 2013-02-05 MED ORDER — FAMOTIDINE 20 MG PO TABS: 20.0000 mg | ORAL_TABLET | Freq: Two times a day (BID) | ORAL | Status: DC

## 2013-02-05 NOTE — Telephone Encounter (Signed)
Pt came in today and given scripts

## 2013-02-05 NOTE — Telephone Encounter (Signed)
Pt saw Dr. Thedore Mins on 9/17. She would like her Pepcid and Prilosec.

## 2013-02-14 ENCOUNTER — Ambulatory Visit (HOSPITAL_COMMUNITY)
Admission: RE | Admit: 2013-02-14 | Discharge: 2013-02-14 | Disposition: A | Discharge status: Home / Self Care | Payer: No Typology Code available for payment source | Source: Ambulatory Visit | Attending: Internal Medicine | Admitting: Internal Medicine

## 2013-02-14 DIAGNOSIS — M797 Fibromyalgia: Secondary | ICD-10-CM

## 2013-02-14 DIAGNOSIS — M25559 Pain in unspecified hip: Secondary | ICD-10-CM | POA: Insufficient documentation

## 2013-02-14 DIAGNOSIS — M169 Osteoarthritis of hip, unspecified: Secondary | ICD-10-CM | POA: Insufficient documentation

## 2013-02-14 DIAGNOSIS — M79609 Pain in unspecified limb: Secondary | ICD-10-CM | POA: Insufficient documentation

## 2013-02-14 DIAGNOSIS — M25569 Pain in unspecified knee: Secondary | ICD-10-CM | POA: Insufficient documentation

## 2013-02-14 DIAGNOSIS — M1610 Unilateral primary osteoarthritis, unspecified hip: Secondary | ICD-10-CM | POA: Insufficient documentation

## 2013-03-06 ENCOUNTER — Encounter: Payer: Medicaid Other | Admitting: Obstetrics & Gynecology

## 2013-04-23 ENCOUNTER — Encounter (HOSPITAL_COMMUNITY): Payer: Self-pay | Admitting: Emergency Medicine

## 2013-04-23 ENCOUNTER — Emergency Department (INDEPENDENT_AMBULATORY_CARE_PROVIDER_SITE_OTHER)
Admission: EM | Admit: 2013-04-23 | Discharge: 2013-04-23 | Disposition: A | Discharge status: Home / Self Care | Payer: No Typology Code available for payment source | Source: Home / Self Care | Attending: Emergency Medicine | Admitting: Emergency Medicine

## 2013-04-23 DIAGNOSIS — A049 Bacterial intestinal infection, unspecified: Secondary | ICD-10-CM

## 2013-04-23 LAB — POCT URINALYSIS DIP (DEVICE)
Bilirubin Urine: NEGATIVE
Glucose, UA: NEGATIVE mg/dL
Leukocytes, UA: NEGATIVE
Nitrite: NEGATIVE
Protein, ur: NEGATIVE mg/dL
pH: 6 (ref 5.0–8.0)

## 2013-04-23 LAB — POCT I-STAT, CHEM 8
Calcium, Ion: 1.15 mmol/L (ref 1.12–1.23)
Chloride: 101 mEq/L (ref 96–112)
HCT: 42 % (ref 36.0–46.0)
Potassium: 3.4 mEq/L — ABNORMAL LOW (ref 3.7–5.3)

## 2013-04-23 LAB — CBC WITH DIFFERENTIAL/PLATELET
Eosinophils Absolute: 0.3 10*3/uL (ref 0.0–0.7)
HCT: 39.9 % (ref 36.0–46.0)
Hemoglobin: 12.7 g/dL (ref 12.0–15.0)
Lymphs Abs: 2.9 10*3/uL (ref 0.7–4.0)
MCH: 25.4 pg — ABNORMAL LOW (ref 26.0–34.0)
MCHC: 31.8 g/dL (ref 30.0–36.0)
Monocytes Absolute: 1.2 10*3/uL — ABNORMAL HIGH (ref 0.1–1.0)
Monocytes Relative: 8 % (ref 3–12)
Neutro Abs: 11 10*3/uL — ABNORMAL HIGH (ref 1.7–7.7)
Neutrophils Relative %: 71 % (ref 43–77)
RBC: 5 MIL/uL (ref 3.87–5.11)

## 2013-04-23 MED ORDER — CIPROFLOXACIN HCL 500 MG PO TABS: 500.0000 mg | ORAL_TABLET | Freq: Two times a day (BID) | ORAL | Status: DC

## 2013-04-23 MED ORDER — METRONIDAZOLE 500 MG PO TABS: 500.0000 mg | ORAL_TABLET | Freq: Three times a day (TID) | ORAL | Status: DC

## 2013-04-23 MED ORDER — GI COCKTAIL ~~LOC~~
ORAL | Status: AC
  Filled 2013-04-23: qty 30

## 2013-04-23 MED ORDER — DIPHENOXYLATE-ATROPINE 2.5-0.025 MG PO TABS: 1.0000 | ORAL_TABLET | Freq: Four times a day (QID) | ORAL | Status: DC | PRN

## 2013-04-23 MED ORDER — GI COCKTAIL ~~LOC~~
30.0000 mL | Freq: Once | ORAL | Status: AC
  Administered 2013-04-23: 30 mL via ORAL

## 2013-04-23 MED ORDER — ONDANSETRON 4 MG PO TBDP
ORAL_TABLET | ORAL | Status: AC
  Filled 2013-04-23: qty 2

## 2013-04-23 MED ORDER — ONDANSETRON 8 MG PO TBDP: 8.0000 mg | ORAL_TABLET | Freq: Three times a day (TID) | ORAL | Status: DC | PRN

## 2013-04-23 MED ORDER — DICYCLOMINE HCL 20 MG PO TABS: 20.0000 mg | ORAL_TABLET | Freq: Two times a day (BID) | ORAL | Status: DC

## 2013-04-23 MED ORDER — ONDANSETRON 4 MG PO TBDP
8.0000 mg | ORAL_TABLET | Freq: Once | ORAL | Status: AC
  Administered 2013-04-23: 8 mg via ORAL

## 2013-04-23 NOTE — ED Notes (Signed)
Reports diarrhea and vomiting on Monday.  , vomiting has stopped.  Patient continues to have diarrhea, but fewer episodes. Pain in low abdomen, left upper quadrant abdomen.  C/o neck and back pain/soreness.

## 2013-04-23 NOTE — ED Provider Notes (Signed)
Chief Complaint:   Chief Complaint  Patient presents with  . Diarrhea    History of Present Illness:   Lisa Moreno is a 37 year old female who has had a three-day history of nausea and vomiting at onset, although this has gotten better, sore throat which she attributes to the vomiting, and diarrheal stools. The first day she had 12 diarrheal stools, yesterday she had 5, and today she's only had 2. She denies any blood in the stool. There's been no hematemesis, coffee-ground emesis, or bilious emesis. She feels achy all over, her neck feels sore. She feels tired and weak. She denies any URI symptoms otherwise. She had some upper abdominal pain that comes and goes in the epigastrium and left upper quadrant. She denies any lower abdominal pain. She denies any suspicious ingestions, foreign travel, sick contacts, or animal exposure.  Review of Systems:  Other than noted above, the patient denies any of the following symptoms: Systemic:  No fevers, chills, sweats, weight loss or gain, fatigue, or tiredness. ENT:  No nasal congestion, rhinorrhea, or sore throat. Lungs:  No cough, wheezing, or shortness of breath. Cardiac:  No chest pain, syncope, or presyncope. GI:  No abdominal pain, nausea, vomiting, anorexia, diarrhea, constipation, blood in stool or vomitus. GU:  No dysuria, frequency, or urgency.  PMFSH:  Past medical history, family history, social history, meds, and allergies were reviewed.  She has a history of reflux and takes Pepcid and Prevacid as well as Neurontin. She has a history of leukocytosis of uncertain cause.  Physical Exam:   Vital signs:  BP 134/83  Pulse 89  Temp(Src) 98.6 F (37 C) (Oral)  Resp 18  SpO2 100% Filed Vitals:   04/23/13 1524 Baseline  04/23/13 1640 Supine  04/23/13 1642 Sitting  04/23/13 1644 Standing   BP: 141/90 128/81 158/115 134/83  Pulse: 78 76 82 89  Temp: 98.6 F (37 C)     TempSrc: Oral     Resp: 18     SpO2: 100%      General:   Alert and oriented.  In no distress.  Skin warm and dry.  Good skin turgor, brisk capillary refill. ENT:  No scleral icterus, moist mucous membranes, no oral lesions, pharynx clear. Lungs:  Breath sounds clear and equal bilaterally.  No wheezes, rales, or rhonchi. Heart:  Rhythm regular, without extrasystoles.  No gallops or murmers. Abdomen:  Soft, flat, nondistended. There was no tenderness to palpation, no guarding, rebound. Bowel sounds are hyperactive. No organomegaly or mass. Skin: Clear, warm, and dry.  Good turgor.  Brisk capillary refill.  Labs:   Results for orders placed during the hospital encounter of 04/23/13  CBC WITH DIFFERENTIAL      Result Value Range   WBC 15.4 (*) 4.0 - 10.5 K/uL   RBC 5.00  3.87 - 5.11 MIL/uL   Hemoglobin 12.7  12.0 - 15.0 g/dL   HCT 16.1  09.6 - 04.5 %   MCV 79.8  78.0 - 100.0 fL   MCH 25.4 (*) 26.0 - 34.0 pg   MCHC 31.8  30.0 - 36.0 g/dL   RDW 40.9 (*) 81.1 - 91.4 %   Platelets 310  150 - 400 K/uL   Neutrophils Relative % 71  43 - 77 %   Neutro Abs 11.0 (*) 1.7 - 7.7 K/uL   Lymphocytes Relative 19  12 - 46 %   Lymphs Abs 2.9  0.7 - 4.0 K/uL   Monocytes Relative 8  3 - 12 %   Monocytes Absolute 1.2 (*) 0.1 - 1.0 K/uL   Eosinophils Relative 2  0 - 5 %   Eosinophils Absolute 0.3  0.0 - 0.7 K/uL   Basophils Relative 0  0 - 1 %   Basophils Absolute 0.0  0.0 - 0.1 K/uL  POCT PREGNANCY, URINE      Result Value Range   Preg Test, Ur NEGATIVE  NEGATIVE  POCT URINALYSIS DIP (DEVICE)      Result Value Range   Glucose, UA NEGATIVE  NEGATIVE mg/dL   Bilirubin Urine NEGATIVE  NEGATIVE   Ketones, ur NEGATIVE  NEGATIVE mg/dL   Specific Gravity, Urine >=1.030  1.005 - 1.030   Hgb urine dipstick TRACE (*) NEGATIVE   pH 6.0  5.0 - 8.0   Protein, ur NEGATIVE  NEGATIVE mg/dL   Urobilinogen, UA 0.2  0.0 - 1.0 mg/dL   Nitrite NEGATIVE  NEGATIVE   Leukocytes, UA NEGATIVE  NEGATIVE  POCT I-STAT, CHEM 8      Result Value Range   Sodium 141  137 - 147  mEq/L   Potassium 3.4 (*) 3.7 - 5.3 mEq/L   Chloride 101  96 - 112 mEq/L   BUN 8  6 - 23 mg/dL   Creatinine, Ser 1.61  0.50 - 1.10 mg/dL   Glucose, Bld 096 (*) 70 - 99 mg/dL   Calcium, Ion 0.45  4.09 - 1.23 mmol/L   TCO2 28  0 - 100 mmol/L   Hemoglobin 14.3  12.0 - 15.0 g/dL   HCT 81.1  91.4 - 78.2 %     Course in Urgent Care Center:   She was given 30 cc of GI cocktail and Zofran ODT 8 mg sublingually.  Assessment:  The encounter diagnosis was Bacterial gastroenteritis.  Her elevated white blood cell count suggest a bacterial infection, although she did mention that her white count is always elevated in this range. This may just be her normal.  Plan:   1.  Meds:  The following meds were prescribed:   Discharge Medication List as of 04/23/2013  4:26 PM    START taking these medications   Details  dicyclomine (BENTYL) 20 MG tablet Take 1 tablet (20 mg total) by mouth 2 (two) times daily., Starting 04/23/2013, Until Discontinued, Print    diphenoxylate-atropine (LOMOTIL) 2.5-0.025 MG per tablet Take 1 tablet by mouth 4 (four) times daily as needed for diarrhea or loose stools., Starting 04/23/2013, Until Discontinued, Print    metroNIDAZOLE (FLAGYL) 500 MG tablet Take 1 tablet (500 mg total) by mouth 3 (three) times daily., Starting 04/23/2013, Until Discontinued, Print    ondansetron (ZOFRAN ODT) 8 MG disintegrating tablet Take 1 tablet (8 mg total) by mouth every 8 (eight) hours as needed for nausea., Starting 04/23/2013, Until Discontinued, Print    ciprofloxacin (CIPRO) 500 MG tablet Take 1 tablet (500 mg total) by mouth every 12 (twelve) hours., Starting 04/23/2013, Until Discontinued, Normal        2.  Patient Education/Counseling:  The patient was given appropriate handouts, self care instructions, and instructed in symptomatic relief. The patient was told to stay on clear liquids for the remainder of the day, then advance to a B.R.A.T. diet starting tomorrow.  3.  Follow  up:  The patient was told to follow up if no better in 3 to 4 days, if becoming worse in any way, and given some red flag symptoms such as persistent vomiting, dizziness, or any evidence of GI  bleeding which would prompt immediate return.  Follow up here if necessary.       Reuben Likes, MD 04/23/13 [phone]

## 2013-04-25 NOTE — ED Notes (Signed)
[facility] and said she can't afford fill the rest of her partial Rx. of  Cipro and Flagyl, because she had 5 Rx.'s that cost $100.00 at [facility]. She asked for cheaper Rx.'s.  Discussed with Dr. Artis FlockKindl and he said [facility]  Call and find out how much it will cost.   He said subtract what she already received and call in the rest of the amount to [facility]. [facility] [place] [facility] [place].  I called the pharmacist and 16 Cipro will be $4.00 and 24 Flagyl will be $27.24. Pt. said she can afford $31.24. I gave pharmacist the Rx.'s as previously ordered, except qty adjusted as directed ( Cipro # 16 and Flagyl #24.) Pt. Asked for her BP to be rechecked.  It was 116/83. I instructed her to keep drinking plenty of water to rehydrate herself. She voiced understanding. Vassie MoselleYork, Myrth Dahan M 04/25/2013

## 2013-05-02 ENCOUNTER — Other Ambulatory Visit: Payer: Self-pay | Admitting: Internal Medicine

## 2013-05-22 ENCOUNTER — Encounter: Payer: Self-pay | Admitting: Internal Medicine

## 2013-05-22 ENCOUNTER — Ambulatory Visit: Payer: No Typology Code available for payment source | Attending: Internal Medicine | Admitting: Internal Medicine

## 2013-05-22 VITALS — BP 119/74 | HR 79 | Temp 98.7°F | Resp 16 | Ht 63.0 in | Wt 273.0 lb

## 2013-05-22 DIAGNOSIS — Z7982 Long term (current) use of aspirin: Secondary | ICD-10-CM | POA: Insufficient documentation

## 2013-05-22 DIAGNOSIS — IMO0001 Reserved for inherently not codable concepts without codable children: Secondary | ICD-10-CM | POA: Insufficient documentation

## 2013-05-22 DIAGNOSIS — M797 Fibromyalgia: Secondary | ICD-10-CM

## 2013-05-22 DIAGNOSIS — R112 Nausea with vomiting, unspecified: Secondary | ICD-10-CM

## 2013-05-22 DIAGNOSIS — R197 Diarrhea, unspecified: Secondary | ICD-10-CM | POA: Insufficient documentation

## 2013-05-22 LAB — POCT GLYCOSYLATED HEMOGLOBIN (HGB A1C): HEMOGLOBIN A1C: 6.7

## 2013-05-22 MED ORDER — CIPROFLOXACIN HCL 500 MG PO TABS: 500.0000 mg | ORAL_TABLET | Freq: Two times a day (BID) | ORAL | Status: DC

## 2013-05-22 MED ORDER — OMEPRAZOLE 20 MG PO CPDR: 20.0000 mg | DELAYED_RELEASE_CAPSULE | Freq: Every day | ORAL | Status: DC

## 2013-05-22 MED ORDER — LOPERAMIDE HCL 2 MG PO TABS: 2.0000 mg | ORAL_TABLET | Freq: Four times a day (QID) | ORAL | Status: DC | PRN

## 2013-05-22 MED ORDER — METRONIDAZOLE 500 MG PO TABS: 500.0000 mg | ORAL_TABLET | Freq: Three times a day (TID) | ORAL | Status: DC

## 2013-05-22 MED ORDER — GABAPENTIN 300 MG PO CAPS: 300.0000 mg | ORAL_CAPSULE | Freq: Three times a day (TID) | ORAL | Status: DC

## 2013-05-22 MED ORDER — ONDANSETRON 4 MG PO TBDP: 8.0000 mg | ORAL_TABLET | Freq: Three times a day (TID) | ORAL | Status: DC | PRN

## 2013-05-22 NOTE — Progress Notes (Signed)
Patient ID: Lisa Moreno, female   DOB: 24-Jul-1975, 38 y.o.   MRN: [id number] Patient Demographics  Lisa Moreno, is a 38 y.o. female  [id number]  [id number]  DOB - 24-Jul-1975  Chief Complaint  Patient presents with  . Follow-up        Subjective:   Lisa Moreno is a 38 y.o. female here today for a follow up visit. Patient was recently seen in the ER for bacterial gastroenteritis, she was prescribed ciprofloxacin and metronidazole. She is to continue to have nausea vomiting and diarrhea, there is no fever. She had leukocytosis in the ER, reason for antibiotic prescription. Patient did not take antibiotics as prescribed because initially she could not afford the prescription, she went back to the urgent care and quantity of her antibiotics for readjusted, she's not sure for how long she took antibiotics in total. She has no fever. Patient has No headache, No chest pain, No new weakness tingling or numbness, No Cough - SOB.  ALLERGIES: No Known Allergies  PAST MEDICAL HISTORY: Past Medical History  Diagnosis Date  . Asthma   . Gestational diabetes     MEDICATIONS AT HOME: Prior to Admission medications   Medication Sig Start Date End Date Taking? Authorizing Provider  albuterol (PROVENTIL HFA;VENTOLIN HFA) 108 (90 BASE) MCG/ACT inhaler Inhale 2 puffs into the lungs every 6 (six) hours as needed for wheezing.   Yes Historical Provider, MD  albuterol (PROVENTIL) (5 MG/ML) 0.5% nebulizer solution Take 0.5 mLs (2.5 mg total) by nebulization every 4 (four) hours as needed for wheezing or shortness of breath. 09/18/12  Yes Junius FinnerErin O'Malley, PA-C  aspirin 325 MG tablet Take 325 mg by mouth 2 (two) times daily.   Yes Historical Provider, MD  ciprofloxacin (CIPRO) 500 MG tablet Take 1 tablet (500 mg total) by mouth every 12 (twelve) hours. 05/22/13  Yes Jeanann Lewandowskylugbemiga Eulanda Dorion, MD  diphenoxylate-atropine (LOMOTIL) 2.5-0.025 MG per tablet Take 1 tablet by mouth 4  (four) times daily as needed for diarrhea or loose stools. 04/23/13  Yes Reuben Likesavid C Keller, MD  famotidine (PEPCID) 20 MG tablet TAKE 1 TABLET BY MOUTH 2 TIMES DAILY. 05/02/13  Yes Leroy SeaPrashant K Singh, MD  fluticasone (FLONASE) 50 MCG/ACT nasal spray Place 2 sprays into the nose daily.   Yes Historical Provider, MD  gabapentin (NEURONTIN) 300 MG capsule Take 1 capsule (300 mg total) by mouth 3 (three) times daily. 01/08/13  Yes Leroy SeaPrashant K Singh, MD  Multiple Vitamin (MULTIVITAMIN) tablet Take 1 tablet by mouth daily.   Yes Historical Provider, MD  omeprazole (PRILOSEC) 20 MG capsule Take 1 capsule (20 mg total) by mouth daily. 05/22/13  Yes Jeanann Lewandowskylugbemiga Hera Celaya, MD  ondansetron (ZOFRAN ODT) 4 MG disintegrating tablet Take 2 tablets (8 mg total) by mouth every 8 (eight) hours as needed for nausea or vomiting. 05/22/13  Yes Jeanann Lewandowskylugbemiga Lawren Sexson, MD  azithromycin (ZITHROMAX) 250 MG tablet Take 1 tablet (250 mg total) by mouth daily. Take first 2 tablets together, then 1 every day until finished. 09/18/12   Junius FinnerErin O'Malley, PA-C  dicyclomine (BENTYL) 20 MG tablet Take 1 tablet (20 mg total) by mouth 2 (two) times daily. 04/23/13   Reuben Likesavid C Keller, MD  HYDROcodone-acetaminophen (NORCO/VICODIN) 5-325 MG per tablet Take 1 tablet by mouth every 4 (four) hours as needed for pain. 08/23/12   Shari A Upstill, PA-C  loperamide (IMODIUM A-D) 2 MG tablet Take 1 tablet (2 mg total) by mouth 4 (four) times daily as needed for  diarrhea or loose stools. 05/22/13   Jeanann Lewandowsky, MD  loratadine (CLARITIN) 10 MG tablet Take 10 mg by mouth daily.    Historical Provider, MD  medroxyPROGESTERone (DEPO-PROVERA) 150 MG/ML injection Inject 150 mg into the muscle every 3 (three) months.    Historical Provider, MD  metroNIDAZOLE (FLAGYL) 500 MG tablet Take 1 tablet (500 mg total) by mouth 3 (three) times daily. 05/22/13   Jeanann Lewandowsky, MD  naproxen (NAPROSYN) 500 MG tablet Take 500 mg by mouth 2 (two) times daily with a meal.    Historical  Provider, MD     Objective:   Filed Vitals:   05/22/13 1639  BP: 119/74  Pulse: 79  Temp: 98.7 F (37.1 C)  TempSrc: Oral  Resp: 16  Height: 5\' 3"  (1.6 m)  Weight: 273 lb (123.832 kg)  SpO2: 97%    Exam General appearance : Awake, alert, not in any distress. Speech Clear. Not toxic looking HEENT: Atraumatic and Normocephalic, pupils equally reactive to light and accomodation Neck: supple, no JVD. No cervical lymphadenopathy.  Chest:Good air entry bilaterally, no added sounds  CVS: S1 S2 regular, no murmurs.  Abdomen: Bowel sounds present, Non tender and not distended with no gaurding, rigidity or rebound. Extremities: B/L Lower Ext shows no edema, both legs are warm to touch Neurology: Awake alert, and oriented X 3, CN II-XII intact, Non focal Skin:No Rash Wounds:N/A   Data Review   CBC No results found for this basename: WBC, HGB, HCT, PLT, MCV, MCH, MCHC, RDW, NEUTRABS, LYMPHSABS, MONOABS, EOSABS, BASOSABS, BANDABS, BANDSABD,  in the last 168 hours  Chemistries   No results found for this basename: NA, K, CL, CO2, GLUCOSE, BUN, CREATININE, GFRCGP, CALCIUM, MG, AST, ALT, ALKPHOS, BILITOT,  in the last 168 hours ------------------------------------------------------------------------------------------------------------------ No results found for this basename: HGBA1C,  in the last 72 hours ------------------------------------------------------------------------------------------------------------------ No results found for this basename: CHOL, HDL, LDLCALC, TRIG, CHOLHDL, LDLDIRECT,  in the last 72 hours ------------------------------------------------------------------------------------------------------------------ No results found for this basename: TSH, T4TOTAL, FREET3, T3FREE, THYROIDAB,  in the last 72 hours ------------------------------------------------------------------------------------------------------------------ No results found for this basename:  VITAMINB12, FOLATE, FERRITIN, TIBC, IRON, RETICCTPCT,  in the last 72 hours  Coagulation profile  No results found for this basename: INR, PROTIME,  in the last 168 hours    Assessment & Plan   1. Fibromyalgia Patient counseled extensively diagnosis  2. Nausea with vomiting  - omeprazole (PRILOSEC) 20 MG capsule; Take 1 capsule (20 mg total) by mouth daily.  Dispense: 30 capsule; Refill: 0 - ondansetron (ZOFRAN ODT) 4 MG disintegrating tablet; Take 2 tablets (8 mg total) by mouth every 8 (eight) hours as needed for nausea or vomiting.  Dispense: 20 tablet; Refill: 0  3. Diarrhea I will reorder the antibiotics based on the fact that patient did not really take the previous prescription as prescribed. - ciprofloxacin (CIPRO) 500 MG tablet; Take 1 tablet (500 mg total) by mouth every 12 (twelve) hours.  Dispense: 20 tablet; Refill: 0 - metroNIDAZOLE (FLAGYL) 500 MG tablet; Take 1 tablet (500 mg total) by mouth 3 (three) times daily.  Dispense: 30 tablet; Refill: 0  Lab tests: - CBC with Differential - COMPLETE METABOLIC PANEL WITH GFR - POCT A1C - Urinalysis, Complete - loperamide (IMODIUM A-D) 2 MG tablet; Take 1 tablet (2 mg total) by mouth 4 (four) times daily as needed for diarrhea or loose stools.  Dispense: 30 tablet; Refill: 0   Follow up in one week. If symptoms persist, we will  need to obtain stool sample for testing especially for Clostridium difficile. Antibiotics continue for possibility of colitis. Patient was counseled extensively on liberal oral liquid intake to prevent dehydration  The patient was given clear instructions to go to ER or return to medical center if symptoms don't improve, worsen or new problems develop. The patient verbalized understanding. The patient was told to call to get lab results if they haven't heard anything in the next week.    Jeanann Lewandowsky, MD, MHA, FACP, FAAP [facility] [place],  [place] [phone]   05/22/2013, 5:04 PM

## 2013-05-22 NOTE — Patient Instructions (Signed)
Diarrea   (Diarrhea)  La diarrea consiste en evacuaciones intestinales frecuentes, blandas o acuosas. Puede hacerlo sentir débil y deshidratado. La deshidratación puede hacer que se sienta cansado, sediento, tener la boca seca y que haya disminución de orina, que a menudo es de color amarillo oscuro. La diarrea es un signo de otro problema, generalmente una infección que no durará mucho tiempo. En la mayoría de los casos, la diarrea dura típicamente 2 a 3 días. Sin embargo, puede durar más tiempo si se trata de un signo de algo más serio. Es importante tratar la diarrea como lo indique su médico para disminuir o prevenir futuros episodios de diarrea.   CAUSAS   Algunas causas comunes son:   · Infecciones gastrointestinales causadas por virus, bacterias o parásitos.  · Intoxicación alimentaria o alergias a los alimentos.  · Ciertos medicamentos, como los antibióticos, quimioterapia y laxantes.  · Edulcorantes artificiales y fructosa.  · Los trastornos digestivos.  INSTRUCCIONES PARA EL CUIDADO EN EL HOGAR   · Asegure una adecuada ingesta de líquidos (hidratación). Evite los líquidos que contengan azúcares simples o las bebidas deportivas, los jugos de frutas, los productos derivados de la leche entera y las gaseosas. Si bebe la cantidad suficiente de líquidos, la orina debe ser clara o amarillo pálido. Una solución de rehidratación oral se puede comprar en las farmacias, en las tiendas minoristas y por Internet. Se puede preparar una solución de rehidratación oral casera con los siguientes ingredientes:  ·    cucharadita de sal.  · ¾ cucharadita de bicarbonato.  ·  de cucharadita de sal sustituta que contenga cloruro de potasio.  · 1  cucharada de azúcar.  · 1l (34 onzas) de agua.  · Ciertos alimentos y bebidas pueden aumentar la velocidad a la que el alimento se mueve a través del tracto gastrointestinal (GI). Estos alimentos y bebidas deben evitarse e incluyen:  · Bebidas alcohólicas y con cafeína.  · Alimentos  ricos en fibra, como frutas y verduras, nueces, semillas, panes y cereales integrales.  · Alimentos y bebidas endulzados con alcoholes de azúcar, tales como xilitol, sorbitol, y manitol.  · Algunos alimentos pueden ser bien tolerados y puede ayudar a espesar las heces, incluyendo:  · Alimentos con almidón, como arroz, pan, pasta, cereales bajos en azúcar, avena, sémola de maíz, papas al horno, galletas y panecillos.  · Bananas.  · Puré de manzana.  · Agregue alimentos ricos en probióticos a la dieta del niño para ayudar a aumentar las bacterias saludables en el tracto gastrointestinal, como el yogur y productos lácteos fermentados.  · Lávese bien las manos después de cada episodio de diarrea.  · Tome sólo medicamentos de venta libre o recetados, según las indicaciones del médico.  · Tome un baño caliente para ayudar a disminuir ardor o dolor por los episodios frecuentes de diarrea.  SOLICITE ATENCIÓN MÉDICA DE INMEDIATO SI:   · No puede retener los líquidos.  · Tiene vómitos persistentes.  · Observa sangre en la materia fecal, o las heces son negras y de aspecto alquitranado.  · No hay emisión de orina durante 6 a 8 horas o elimina una pequeña cantidad de orina muy oscura.  · Tiene dolor abdominal que aumenta o se localiza.  · Está muy mareado o se desvanece.  · Sufre un dolor intenso de cabeza.  · La diarrea empeora o no mejora.  · Tiene fiebre o síntomas que persisten durante más de 2 o 3 días.  · Tiene fiebre y los síntomas   empeoran de manera súbita.  ASEGÚRESE DE QUE:   · Comprende estas instrucciones.  · Controlará su enfermedad.  · Solicitará ayuda de inmediato si no mejora o si empeora.  Document Released: 04/10/2005 Document Revised: 03/27/2012  [employer]® Patient Information ©2014 [employer].

## 2013-05-22 NOTE — Progress Notes (Signed)
Pt is here today because for about a month she is having constant diarrhea, nausea and vomiting.

## 2013-05-23 LAB — CBC WITH DIFFERENTIAL/PLATELET
BASOS PCT: 0 % (ref 0–1)
Basophils Absolute: 0.1 10*3/uL (ref 0.0–0.1)
EOS ABS: 0.3 10*3/uL (ref 0.0–0.7)
EOS PCT: 2 % (ref 0–5)
HCT: 36.8 % (ref 36.0–46.0)
HEMOGLOBIN: 12.2 g/dL (ref 12.0–15.0)
LYMPHS ABS: 3.6 10*3/uL (ref 0.7–4.0)
Lymphocytes Relative: 21 % (ref 12–46)
MCH: 25.4 pg — ABNORMAL LOW (ref 26.0–34.0)
MCHC: 33.2 g/dL (ref 30.0–36.0)
MCV: 76.5 fL — AB (ref 78.0–100.0)
MONOS PCT: 6 % (ref 3–12)
Monocytes Absolute: 1.1 10*3/uL — ABNORMAL HIGH (ref 0.1–1.0)
Neutro Abs: 11.7 10*3/uL — ABNORMAL HIGH (ref 1.7–7.7)
Neutrophils Relative %: 71 % (ref 43–77)
Platelets: 363 10*3/uL (ref 150–400)
RBC: 4.81 MIL/uL (ref 3.87–5.11)
RDW: 15.7 % — ABNORMAL HIGH (ref 11.5–15.5)
WBC: 16.7 10*3/uL — ABNORMAL HIGH (ref 4.0–10.5)

## 2013-05-23 LAB — COMPLETE METABOLIC PANEL WITH GFR
ALK PHOS: 100 U/L (ref 39–117)
ALT: 15 U/L (ref 0–35)
AST: 14 U/L (ref 0–37)
Albumin: 4 g/dL (ref 3.5–5.2)
BILIRUBIN TOTAL: 0.2 mg/dL (ref 0.2–1.2)
BUN: 14 mg/dL (ref 6–23)
CO2: 29 meq/L (ref 19–32)
CREATININE: 0.54 mg/dL (ref 0.50–1.10)
Calcium: 9.1 mg/dL (ref 8.4–10.5)
Chloride: 100 mEq/L (ref 96–112)
GLUCOSE: 154 mg/dL — AB (ref 70–99)
Potassium: 4.1 mEq/L (ref 3.5–5.3)
Sodium: 138 mEq/L (ref 135–145)
Total Protein: 7.1 g/dL (ref 6.0–8.3)

## 2013-05-23 LAB — URINALYSIS, COMPLETE
Bacteria, UA: NONE SEEN
Bilirubin Urine: NEGATIVE
Casts: NONE SEEN
Crystals: NONE SEEN
Glucose, UA: NEGATIVE mg/dL
Hgb urine dipstick: NEGATIVE
Ketones, ur: NEGATIVE mg/dL
Leukocytes, UA: NEGATIVE
NITRITE: NEGATIVE
Protein, ur: NEGATIVE mg/dL
SPECIFIC GRAVITY, URINE: 1.028 (ref 1.005–1.030)
UROBILINOGEN UA: 0.2 mg/dL (ref 0.0–1.0)
pH: 6 (ref 5.0–8.0)

## 2013-05-26 ENCOUNTER — Ambulatory Visit: Payer: Self-pay | Attending: Internal Medicine | Admitting: Pharmacist

## 2013-05-26 VITALS — BP 126/80 | HR 70 | Temp 98.7°F | Resp 17

## 2013-05-26 DIAGNOSIS — Z0000 Encounter for general adult medical examination without abnormal findings: Secondary | ICD-10-CM | POA: Insufficient documentation

## 2013-05-26 LAB — POCT URINE PREGNANCY: Preg Test, Ur: NEGATIVE

## 2013-05-26 MED ORDER — MEDROXYPROGESTERONE ACETATE 150 MG/ML IM SUSP
150.0000 mg | Freq: Once | INTRAMUSCULAR | Status: AC
  Administered 2013-05-26: 150 mg via INTRAMUSCULAR

## 2013-05-26 NOTE — Progress Notes (Signed)
Patient here for depo injection POCT pregnancy is negative

## 2013-06-03 ENCOUNTER — Telehealth: Payer: Self-pay | Admitting: Internal Medicine

## 2013-06-03 NOTE — Telephone Encounter (Signed)
Pt is here today to express that she has been have some side effects to her current medication metroNIDAZOLE (FLAGYL) 500 MG tablet and ciprofloxacin (CIPRO) 500 MG tablet; side effects include stomach aches, loss of appetite, loss of sleep etc. Please f/u with pt about her medication;

## 2013-06-04 ENCOUNTER — Ambulatory Visit: Payer: Self-pay

## 2013-06-06 ENCOUNTER — Ambulatory Visit: Payer: Self-pay | Attending: Internal Medicine | Admitting: Internal Medicine

## 2013-06-06 ENCOUNTER — Encounter: Payer: Self-pay | Admitting: Internal Medicine

## 2013-06-06 ENCOUNTER — Ambulatory Visit: Payer: No Typology Code available for payment source | Attending: Internal Medicine

## 2013-06-06 VITALS — BP 138/83 | HR 86 | Temp 98.6°F | Resp 16

## 2013-06-06 DIAGNOSIS — R197 Diarrhea, unspecified: Secondary | ICD-10-CM | POA: Insufficient documentation

## 2013-06-06 DIAGNOSIS — Z79899 Other long term (current) drug therapy: Secondary | ICD-10-CM | POA: Insufficient documentation

## 2013-06-06 DIAGNOSIS — R112 Nausea with vomiting, unspecified: Secondary | ICD-10-CM

## 2013-06-06 DIAGNOSIS — D72829 Elevated white blood cell count, unspecified: Secondary | ICD-10-CM | POA: Insufficient documentation

## 2013-06-06 DIAGNOSIS — Z7982 Long term (current) use of aspirin: Secondary | ICD-10-CM | POA: Insufficient documentation

## 2013-06-06 DIAGNOSIS — K529 Noninfective gastroenteritis and colitis, unspecified: Secondary | ICD-10-CM

## 2013-06-06 DIAGNOSIS — R109 Unspecified abdominal pain: Secondary | ICD-10-CM | POA: Insufficient documentation

## 2013-06-06 DIAGNOSIS — J45909 Unspecified asthma, uncomplicated: Secondary | ICD-10-CM | POA: Insufficient documentation

## 2013-06-06 LAB — POCT URINALYSIS DIPSTICK
BILIRUBIN UA: NEGATIVE
Blood, UA: NEGATIVE
Glucose, UA: NEGATIVE
Ketones, UA: NEGATIVE
Leukocytes, UA: NEGATIVE
Nitrite, UA: NEGATIVE
Protein, UA: 30
Urobilinogen, UA: 2
pH, UA: 6

## 2013-06-06 LAB — CBC WITH DIFFERENTIAL/PLATELET
Basophils Absolute: 0 10*3/uL (ref 0.0–0.1)
Basophils Relative: 0 % (ref 0–1)
EOS PCT: 2 % (ref 0–5)
Eosinophils Absolute: 0.3 10*3/uL (ref 0.0–0.7)
HCT: 35.1 % — ABNORMAL LOW (ref 36.0–46.0)
Hemoglobin: 11.4 g/dL — ABNORMAL LOW (ref 12.0–15.0)
LYMPHS PCT: 20 % (ref 12–46)
Lymphs Abs: 3.4 10*3/uL (ref 0.7–4.0)
MCH: 24.9 pg — ABNORMAL LOW (ref 26.0–34.0)
MCHC: 32.5 g/dL (ref 30.0–36.0)
MCV: 76.8 fL — AB (ref 78.0–100.0)
Monocytes Absolute: 1.1 10*3/uL — ABNORMAL HIGH (ref 0.1–1.0)
Monocytes Relative: 7 % (ref 3–12)
NEUTROS PCT: 71 % (ref 43–77)
Neutro Abs: 12.1 10*3/uL — ABNORMAL HIGH (ref 1.7–7.7)
Platelets: 336 10*3/uL (ref 150–400)
RBC: 4.57 MIL/uL (ref 3.87–5.11)
RDW: 15.7 % — ABNORMAL HIGH (ref 11.5–15.5)
WBC: 16.9 10*3/uL — ABNORMAL HIGH (ref 4.0–10.5)

## 2013-06-06 LAB — POCT URINE PREGNANCY: Preg Test, Ur: NEGATIVE

## 2013-06-06 LAB — LIPASE: Lipase: 16 U/L (ref 0–75)

## 2013-06-06 MED ORDER — METRONIDAZOLE 500 MG PO TABS: 500.0000 mg | ORAL_TABLET | Freq: Three times a day (TID) | ORAL | Status: AC

## 2013-06-06 MED ORDER — OMEPRAZOLE 40 MG PO CPDR: 40.0000 mg | DELAYED_RELEASE_CAPSULE | Freq: Every day | ORAL | Status: DC

## 2013-06-06 NOTE — Progress Notes (Signed)
Patient ID: Lisa Moreno, female   DOB: 03/23/1976, 38 y.o.   MRN: [id number]   CC:  HPI: 38 year old female she evaluation of chronic diarrhea and chronic abdominal pain. The patient apparently was in the ER and on December was treated for bacterial gastroenteritis, with a white count of 15.4 with ciprofloxacin and Flagyl. She saw Dr. Hyman Hopes on 1/29 with persistent symptoms. Repeat white count was 16.7. Patient's ciprofloxacin and Flagyl were renewed. No imaging studies were obtained. No stool studies were obtained The patient presents with recurrent abdominal pain, nausea epigastric discomfort. She is taking Imodium and still having 4-5 bowel movements a day. These movements and nonbloody described as greenish.    No Known Allergies Past Medical History  Diagnosis Date  . Asthma   . Gestational diabetes    Current Outpatient Prescriptions on File Prior to Visit  Medication Sig Dispense Refill  . albuterol (PROVENTIL HFA;VENTOLIN HFA) 108 (90 BASE) MCG/ACT inhaler Inhale 2 puffs into the lungs every 6 (six) hours as needed for wheezing.      Marland Kitchen albuterol (PROVENTIL) (5 MG/ML) 0.5% nebulizer solution Take 0.5 mLs (2.5 mg total) by nebulization every 4 (four) hours as needed for wheezing or shortness of breath.  20 mL  12  . aspirin 325 MG tablet Take 325 mg by mouth 2 (two) times daily.      Marland Kitchen dicyclomine (BENTYL) 20 MG tablet Take 1 tablet (20 mg total) by mouth 2 (two) times daily.  20 tablet  0  . famotidine (PEPCID) 20 MG tablet TAKE 1 TABLET BY MOUTH 2 TIMES DAILY.  30 tablet  0  . fluticasone (FLONASE) 50 MCG/ACT nasal spray Place 2 sprays into the nose daily.      Marland Kitchen gabapentin (NEURONTIN) 300 MG capsule Take 1 capsule (300 mg total) by mouth 3 (three) times daily.  90 capsule  3  . loratadine (CLARITIN) 10 MG tablet Take 10 mg by mouth daily.      . medroxyPROGESTERone (DEPO-PROVERA) 150 MG/ML injection Inject 150 mg into the muscle every 3 (three) months.      . Multiple  Vitamin (MULTIVITAMIN) tablet Take 1 tablet by mouth daily.      . ondansetron (ZOFRAN ODT) 4 MG disintegrating tablet Take 2 tablets (8 mg total) by mouth every 8 (eight) hours as needed for nausea or vomiting.  20 tablet  0   No current facility-administered medications on file prior to visit.   History reviewed. No pertinent family history. History   Social History  . Marital Status: Married    Spouse Name: N/A    Number of Children: N/A  . Years of Education: N/A   Occupational History  . Not on file.   Social History Main Topics  . Smoking status: Never Smoker   . Smokeless tobacco: Not on file  . Alcohol Use: No  . Drug Use: No  . Sexual Activity:    Other Topics Concern  . Not on file   Social History Narrative  . No narrative on file    Review of Systems  Constitutional: Negative for fever, chills, diaphoresis, activity change, appetite change and fatigue.  HENT: Negative for ear pain, nosebleeds, congestion, facial swelling, rhinorrhea, neck pain, neck stiffness and ear discharge.   Eyes: Negative for pain, discharge, redness, itching and visual disturbance.  Respiratory: Negative for cough, choking, chest tightness, shortness of breath, wheezing and stridor.   Cardiovascular: Negative for chest pain, palpitations and leg swelling.  Gastrointestinal: Negative for  abdominal distention.  Genitourinary: Negative for dysuria, urgency, frequency, hematuria, flank pain, decreased urine volume, difficulty urinating and dyspareunia.  Musculoskeletal: Negative for back pain, joint swelling, arthralgias and gait problem.  Neurological: Negative for dizziness, tremors, seizures, syncope, facial asymmetry, speech difficulty, weakness, light-headedness, numbness and headaches.  Hematological: Negative for adenopathy. Does not bruise/bleed easily.  Psychiatric/Behavioral: Negative for hallucinations, behavioral problems, confusion, dysphoric mood, decreased concentration and  agitation.    Objective:   Filed Vitals:   06/06/13 1739  BP: 138/83  Pulse: 86  Temp: 98.6 F (37 C)  Resp: 16    Physical Exam  Constitutional: Appears well-developed and well-nourished. No distress.  HENT: Normocephalic. External right and left ear normal. Oropharynx is clear and moist.  Eyes: Conjunctivae and EOM are normal. PERRLA, no scleral icterus.  Neck: Normal ROM. Neck supple. No JVD. No tracheal deviation. No thyromegaly.  CVS: RRR, S1/S2 +, no murmurs, no gallops, no carotid bruit.  Pulmonary: Effort and breath sounds normal, no stridor, rhonchi, wheezes, rales.  Abdominal: Soft. BS +,  no distension, tenderness, rebound or guarding.  Musculoskeletal: Normal range of motion. No edema and no tenderness.  Lymphadenopathy: No lymphadenopathy noted, cervical, inguinal. Neuro: Alert. Normal reflexes, muscle tone coordination. No cranial nerve deficit. Skin: Skin is warm and dry. No rash noted. Not diaphoretic. No erythema. No pallor.  Psychiatric: Normal mood and affect. Behavior, judgment, thought content normal.   Lab Results  Component Value Date   WBC 16.7* 05/22/2013   HGB 12.2 05/22/2013   HCT 36.8 05/22/2013   MCV 76.5* 05/22/2013   PLT 363 05/22/2013   Lab Results  Component Value Date   CREATININE 0.54 05/22/2013   BUN 14 05/22/2013   NA 138 05/22/2013   K 4.1 05/22/2013   CL 100 05/22/2013   CO2 29 05/22/2013    Lab Results  Component Value Date   HGBA1C 6.7 05/22/2013   Lipid Panel     Component Value Date/Time   CHOL 150 01/08/2013 1753   TRIG 121 01/08/2013 1753   HDL 33* 01/08/2013 1753   CHOLHDL 4.5 01/08/2013 1753   VLDL 24 01/08/2013 1753   LDLCALC 93 01/08/2013 1753       Assessment and plan:   Patient Active Problem List   Diagnosis Date Noted  . Nausea with vomiting 05/22/2013  . Diarrhea 05/22/2013  . Fibromyalgia 01/08/2013       Chronic diarrhea Suspect C. difficile given leukocytosis Patient currently only recommended to take  Flagyl for another 3 weeks I have discontinued Imodium and Lomotil We'll repeat CMP, lipase C. difficile PCR and stool studies CT abdomen pelvis rule out colitis  gastroenterology consultation Followup in 2 weeks    The patient was given clear instructions to go to ER or return to medical center if symptoms don't improve, worsen or new problems develop. The patient verbalized understanding. The patient was told to call to get any lab results if not heard anything in the next week.

## 2013-06-06 NOTE — Progress Notes (Signed)
Pt here today with c/o abdominal pain on both sides. States it feels like squeezing sensation intermit with feeling of fullness since last visit. Diarrhea associated with sx. Pt states she was taking 2 different prescribed ATb's and feels side effects are related. Vss.

## 2013-06-06 NOTE — Patient Instructions (Signed)
Pt instructed to take home stool kit home and bring in Monday and to continue taking flagyl only for right now.

## 2013-06-07 LAB — COMPLETE METABOLIC PANEL WITH GFR
ALT: 8 U/L (ref 0–35)
AST: 9 U/L (ref 0–37)
Albumin: 3.8 g/dL (ref 3.5–5.2)
Alkaline Phosphatase: 88 U/L (ref 39–117)
BUN: 12 mg/dL (ref 6–23)
CALCIUM: 9.1 mg/dL (ref 8.4–10.5)
CHLORIDE: 104 meq/L (ref 96–112)
CO2: 28 meq/L (ref 19–32)
Creat: 0.69 mg/dL (ref 0.50–1.10)
GFR, Est African American: >89 mL/min
GFR, Est Non African American: >89 mL/min
Glucose, Bld: 199 mg/dL — ABNORMAL HIGH (ref 70–99)
Potassium: 3.7 mEq/L (ref 3.5–5.3)
SODIUM: 139 meq/L (ref 135–145)
Total Bilirubin: 0.3 mg/dL (ref 0.2–1.2)
Total Protein: 6.7 g/dL (ref 6.0–8.3)

## 2013-06-07 LAB — LIPASE: Lipase: 16 U/L (ref 0–75)

## 2013-06-07 LAB — AMYLASE: Amylase: 41 U/L (ref 0–105)

## 2013-06-09 ENCOUNTER — Telehealth: Payer: Self-pay | Admitting: *Deleted

## 2013-06-09 NOTE — Telephone Encounter (Signed)
Message copied by Zack Crager, [place] on Mon Jun 09, 2013 12:53 PM ------      Message from: Susie CassetteABROL MD, Germain OsgoodNAYANA      Created: Mon Jun 09, 2013 11:55 AM       Notify patient that the WBC count is elevated. The patient was supposed to have a CT scan of the abdomen and pelvis, this was never scheduled.      The patient also needs to bring in a stool sample for evaluation and she is continuing to have diarrhea ------

## 2013-06-09 NOTE — Telephone Encounter (Signed)
First call: Mobile line was busy. Second call to home number: Scheduled patient for a CT. Notified patient of her appointment for her CT Abdomen Pelvis w/Contrast 06/12/13 at 11am. Patient stated that she will call and reschedule for a different day. Patient also stated that she is ready to bring in her stool sample as soon as possible.

## 2013-06-12 ENCOUNTER — Ambulatory Visit (HOSPITAL_COMMUNITY): Payer: Self-pay

## 2013-06-12 ENCOUNTER — Other Ambulatory Visit: Payer: Self-pay | Admitting: Internal Medicine

## 2013-06-13 LAB — CLOSTRIDIUM DIFFICILE BY PCR: Toxigenic C. Difficile by PCR: DETECTED — CR

## 2013-06-16 ENCOUNTER — Ambulatory Visit (HOSPITAL_COMMUNITY)
Admission: RE | Admit: 2013-06-16 | Discharge: 2013-06-16 | Disposition: A | Discharge status: Home / Self Care | Payer: No Typology Code available for payment source | Source: Ambulatory Visit | Attending: Internal Medicine | Admitting: Internal Medicine

## 2013-06-16 DIAGNOSIS — R110 Nausea: Secondary | ICD-10-CM | POA: Insufficient documentation

## 2013-06-16 DIAGNOSIS — K529 Noninfective gastroenteritis and colitis, unspecified: Secondary | ICD-10-CM

## 2013-06-16 DIAGNOSIS — R109 Unspecified abdominal pain: Secondary | ICD-10-CM | POA: Insufficient documentation

## 2013-06-16 DIAGNOSIS — D72829 Elevated white blood cell count, unspecified: Secondary | ICD-10-CM | POA: Insufficient documentation

## 2013-06-16 DIAGNOSIS — R197 Diarrhea, unspecified: Secondary | ICD-10-CM | POA: Insufficient documentation

## 2013-06-16 MED ORDER — IOHEXOL 300 MG/ML  SOLN
100.0000 mL | Freq: Once | INTRAMUSCULAR | Status: AC | PRN
  Administered 2013-06-16: 100 mL via INTRAVENOUS

## 2013-06-17 NOTE — Progress Notes (Signed)
Pt already had CT abd/pelvis done with stool sample

## 2013-06-17 NOTE — Telephone Encounter (Signed)
Pt given lab results. Pt is scheduled for nurse visit 06/20/13 @ 2pm. States she is feeling better but still has some nausea.

## 2013-06-17 NOTE — Telephone Encounter (Signed)
Message copied by Darlis LoanSMITH, JILL D on Tue Jun 17, 2013  5:10 PM ------      Message from: Susie CassetteABROL MD, Germain OsgoodNAYANA      Created: Fri Jun 13, 2013  4:04 PM       Notify patient of the patient's stool studies, show C. difficile colitis. He is reminded patient that she has a nurse visit on 2/27. The patient needs to come in sooner if she still having diarrhea and abdominal pain ------

## 2013-06-18 ENCOUNTER — Other Ambulatory Visit: Payer: Self-pay | Admitting: Internal Medicine

## 2013-06-20 ENCOUNTER — Ambulatory Visit: Payer: Self-pay

## 2013-06-23 ENCOUNTER — Telehealth: Payer: Self-pay | Admitting: *Deleted

## 2013-06-23 ENCOUNTER — Telehealth: Payer: Self-pay | Admitting: Internal Medicine

## 2013-06-23 MED ORDER — VANCOMYCIN HCL 125 MG PO CAPS: 125.0000 mg | ORAL_CAPSULE | Freq: Four times a day (QID) | ORAL | Status: DC

## 2013-06-23 MED ORDER — VANCOMYCIN HCL 250 MG PO CAPS: 250.0000 mg | ORAL_CAPSULE | Freq: Four times a day (QID) | ORAL | Status: DC

## 2013-06-23 NOTE — Telephone Encounter (Signed)
Left a voicemail for patient to return our call. 

## 2013-06-23 NOTE — Progress Notes (Signed)
Patient ID: Lisa RamusJohanna Luyando De La Moreno, female   DOB: 02/19/1976, 38 y.o.   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

## 2013-06-23 NOTE — Telephone Encounter (Signed)
Pt returning call about results. Please f/u with pt

## 2013-06-23 NOTE — Telephone Encounter (Signed)
Message copied by Sherica Paternostro, [place] on Mon Jun 23, 2013  2:01 PM ------      Message from: Susie CassetteABROL MD, Plastic And Reconstructive SurgeonsNAYANA      Created: Wed Jun 18, 2013  1:28 PM       Notify patient CT abdomen and pelvis is negative ------

## 2013-06-23 NOTE — Addendum Note (Signed)
Addended by: Berna Gitto MD, GermSusie Cassetteain OsgoodNAYANA on: 06/23/2013 05:02 PM   Modules accepted: Orders

## 2013-06-24 ENCOUNTER — Telehealth: Payer: Self-pay | Admitting: Emergency Medicine

## 2013-06-24 NOTE — Telephone Encounter (Signed)
Left message for pt to call clinic for results 

## 2013-06-25 ENCOUNTER — Ambulatory Visit: Payer: No Typology Code available for payment source | Attending: Internal Medicine | Admitting: Internal Medicine

## 2013-06-25 VITALS — BP 142/69 | HR 77 | Temp 99.0°F | Resp 16

## 2013-06-25 DIAGNOSIS — R1012 Left upper quadrant pain: Secondary | ICD-10-CM | POA: Insufficient documentation

## 2013-06-25 DIAGNOSIS — R110 Nausea: Secondary | ICD-10-CM | POA: Insufficient documentation

## 2013-06-25 DIAGNOSIS — A0472 Enterocolitis due to Clostridium difficile, not specified as recurrent: Secondary | ICD-10-CM

## 2013-06-25 DIAGNOSIS — Z09 Encounter for follow-up examination after completed treatment for conditions other than malignant neoplasm: Secondary | ICD-10-CM | POA: Insufficient documentation

## 2013-06-25 LAB — COMPLETE METABOLIC PANEL WITH GFR
ALBUMIN: 3.7 g/dL (ref 3.5–5.2)
ALT: 8 U/L (ref 0–35)
AST: 9 U/L (ref 0–37)
Alkaline Phosphatase: 93 U/L (ref 39–117)
BUN: 12 mg/dL (ref 6–23)
CALCIUM: 8.8 mg/dL (ref 8.4–10.5)
CHLORIDE: 105 meq/L (ref 96–112)
CO2: 26 mEq/L (ref 19–32)
Creat: 0.64 mg/dL (ref 0.50–1.10)
GFR, Est African American: >89 mL/min
GFR, Est Non African American: >89 mL/min
Glucose, Bld: 204 mg/dL — ABNORMAL HIGH (ref 70–99)
POTASSIUM: 4.1 meq/L (ref 3.5–5.3)
Sodium: 139 mEq/L (ref 135–145)
TOTAL PROTEIN: 6.9 g/dL (ref 6.0–8.3)
Total Bilirubin: 0.4 mg/dL (ref 0.2–1.2)

## 2013-06-25 LAB — CBC WITH DIFFERENTIAL/PLATELET
BASOS ABS: 0 10*3/uL (ref 0.0–0.1)
Basophils Relative: 0 % (ref 0–1)
Eosinophils Absolute: 0.1 10*3/uL (ref 0.0–0.7)
Eosinophils Relative: 1 % (ref 0–5)
HEMATOCRIT: 35.8 % — AB (ref 36.0–46.0)
Hemoglobin: 11.4 g/dL — ABNORMAL LOW (ref 12.0–15.0)
LYMPHS PCT: 19 % (ref 12–46)
Lymphs Abs: 2.6 10*3/uL (ref 0.7–4.0)
MCH: 24.6 pg — ABNORMAL LOW (ref 26.0–34.0)
MCHC: 31.8 g/dL (ref 30.0–36.0)
MCV: 77.2 fL — ABNORMAL LOW (ref 78.0–100.0)
MONO ABS: 0.8 10*3/uL (ref 0.1–1.0)
Monocytes Relative: 6 % (ref 3–12)
NEUTROS ABS: 10.1 10*3/uL — AB (ref 1.7–7.7)
Neutrophils Relative %: 74 % (ref 43–77)
Platelets: 335 10*3/uL (ref 150–400)
RBC: 4.64 MIL/uL (ref 3.87–5.11)
RDW: 15.7 % — ABNORMAL HIGH (ref 11.5–15.5)
WBC: 13.7 10*3/uL — AB (ref 4.0–10.5)

## 2013-06-25 LAB — LIPASE: Lipase: 16 U/L (ref 0–75)

## 2013-06-25 NOTE — Progress Notes (Signed)
Patient ID: Lisa RamusJohanna Luyando De La Moreno, female   DOB: 1975/10/06, 38 y.o.   MRN: [id number]  Patient seen along with RN for the nurse visit. The patient's diarrhea has resolved. She still continues to have intermittent nausea. The patient is still complaining of some left upper quadrant discomfort especially when she bends over to lift something heavy. She is requesting a letter to be able to minimize the amount of weight that she is lifting at work. Thankfully the patient did not have to start vancomycin and her C. difficile colitis has resolved  The patient will have repeat labs today, CBC, CMP, lipase and abdominal ultrasound to further evaluate her left upper quadrant pain if all negative no further workup is needed she did have a recent CT scan of the abdomen that was negative.  The patient will follow up in 3-4 months as a routine followup

## 2013-06-25 NOTE — Progress Notes (Signed)
Pt is here following up on her diarrhea and abdomen pain. Pt reports that the diarrhea has gone away but she is still having abdomen pain. She is having frequent headaches, feeling tired and out of it.

## 2013-06-27 MED ORDER — DICYCLOMINE HCL 20 MG PO TABS: 10.0000 mg | ORAL_TABLET | Freq: Three times a day (TID) | ORAL | Status: DC

## 2013-06-27 NOTE — Addendum Note (Signed)
Addended by: Susie CassetteABROL MD, Germain OsgoodNAYANA on: 06/27/2013 01:52 PM   Modules accepted: Orders

## 2013-06-30 ENCOUNTER — Ambulatory Visit (HOSPITAL_COMMUNITY): Payer: No Typology Code available for payment source

## 2013-06-30 ENCOUNTER — Telehealth: Payer: Self-pay | Admitting: *Deleted

## 2013-06-30 NOTE — Telephone Encounter (Signed)
Left a voicemail for patient to return our call. 

## 2013-06-30 NOTE — Telephone Encounter (Signed)
Message copied by Muzammil Bruins, [place] on Mon Jun 30, 2013 10:23 AM ------      Message from: Susie CassetteABROL MD, Germain OsgoodNAYANA      Created: Fri Jun 27, 2013  2:34 PM       Notify patient of the labs are reassuring, no further action needed ------

## 2013-07-03 ENCOUNTER — Ambulatory Visit (HOSPITAL_COMMUNITY)
Admission: RE | Admit: 2013-07-03 | Discharge: 2013-07-03 | Disposition: A | Discharge status: Home / Self Care | Payer: No Typology Code available for payment source | Source: Ambulatory Visit | Attending: Internal Medicine | Admitting: Internal Medicine

## 2013-07-03 DIAGNOSIS — A0472 Enterocolitis due to Clostridium difficile, not specified as recurrent: Secondary | ICD-10-CM

## 2013-07-08 ENCOUNTER — Ambulatory Visit (HOSPITAL_COMMUNITY)
Admission: RE | Admit: 2013-07-08 | Discharge: 2013-07-08 | Disposition: A | Discharge status: Home / Self Care | Payer: No Typology Code available for payment source | Source: Ambulatory Visit | Attending: Internal Medicine | Admitting: Internal Medicine

## 2013-07-08 DIAGNOSIS — R109 Unspecified abdominal pain: Secondary | ICD-10-CM | POA: Insufficient documentation

## 2013-07-08 DIAGNOSIS — R112 Nausea with vomiting, unspecified: Secondary | ICD-10-CM | POA: Insufficient documentation

## 2013-07-09 NOTE — Telephone Encounter (Signed)
Message copied by Darlis LoanSMITH, JILL D on Wed Jul 09, 2013  3:18 PM ------      Message from: Susie CassetteABROL MD, Germain OsgoodNAYANA      Created: Wed Jul 09, 2013  9:35 AM       Notify patient that patient's ultrasound is normal ------

## 2013-07-23 ENCOUNTER — Encounter (HOSPITAL_COMMUNITY): Payer: Self-pay | Admitting: Emergency Medicine

## 2013-07-23 ENCOUNTER — Emergency Department (INDEPENDENT_AMBULATORY_CARE_PROVIDER_SITE_OTHER)
Admission: EM | Admit: 2013-07-23 | Discharge: 2013-07-23 | Disposition: A | Discharge status: Home / Self Care | Payer: No Typology Code available for payment source | Source: Home / Self Care

## 2013-07-23 DIAGNOSIS — K6289 Other specified diseases of anus and rectum: Secondary | ICD-10-CM

## 2013-07-23 LAB — OCCULT BLOOD, POC DEVICE: Fecal Occult Bld: NEGATIVE

## 2013-07-23 MED ORDER — LIDOCAINE-HYDROCORTISONE ACE 3-0.5 % RE KIT: PACK | RECTAL | Status: DC

## 2013-07-23 MED ORDER — DILTIAZEM GEL 2 %: CUTANEOUS | Status: DC

## 2013-07-23 NOTE — ED Provider Notes (Signed)
CSN: [id number]     Arrival date & time 07/23/13  9735 History   First MD Initiated Contact with Patient 07/23/13 1119     Chief Complaint  Patient presents with  . Rectal Problems   (Consider location/radiation/quality/duration/timing/severity/associated sxs/prior Treatment) HPI Comments: C/O rectal pain for 2 days associated with BM and wiping/cleaning. Scant blood tinging to wipes.   Past Medical History  Diagnosis Date  . Asthma   . Gestational diabetes    Past Surgical History  Procedure Laterality Date  . Breast reduction surgery     History reviewed. No pertinent family history. History  Substance Use Topics  . Smoking status: Never Smoker   . Smokeless tobacco: Not on file  . Alcohol Use: No   OB History   Grav Para Term Preterm Abortions TAB SAB Ect Mult Living                 Review of Systems  Gastrointestinal: Positive for blood in stool and rectal pain. Negative for abdominal pain, diarrhea and constipation.  All other systems reviewed and are negative.    Allergies  Review of patient's allergies indicates no known allergies.  Home Medications   Current Outpatient Rx  Name  Route  Sig  Dispense  Refill  . albuterol (PROVENTIL HFA;VENTOLIN HFA) 108 (90 BASE) MCG/ACT inhaler   Inhalation   Inhale 2 puffs into the lungs every 6 (six) hours as needed for wheezing.         Marland Kitchen albuterol (PROVENTIL) (5 MG/ML) 0.5% nebulizer solution   Nebulization   Take 0.5 mLs (2.5 mg total) by nebulization every 4 (four) hours as needed for wheezing or shortness of breath.   20 mL   12   . aspirin 325 MG tablet   Oral   Take 325 mg by mouth 2 (two) times daily.         Marland Kitchen dicyclomine (BENTYL) 20 MG tablet   Oral   Take 0.5 tablets (10 mg total) by mouth 4 (four) times daily -  before meals and at bedtime.   60 tablet   2   . diltiazem 2 % GEL      Apply small amount of gel to the rectum tid   30 g   0   . famotidine (PEPCID) 20 MG tablet      TAKE 1  TABLET BY MOUTH 2 TIMES DAILY.   30 tablet   0   . fluticasone (FLONASE) 50 MCG/ACT nasal spray   Nasal   Place 2 sprays into the nose daily.         Marland Kitchen gabapentin (NEURONTIN) 300 MG capsule   Oral   Take 1 capsule (300 mg total) by mouth 3 (three) times daily.   90 capsule   3   . Lidocaine-Hydrocortisone Ace 3-0.5 % KIT      Place rectally 3-4 times a day   1 each   0   . loratadine (CLARITIN) 10 MG tablet   Oral   Take 10 mg by mouth daily.         . medroxyPROGESTERone (DEPO-PROVERA) 150 MG/ML injection   Intramuscular   Inject 150 mg into the muscle every 3 (three) months.         . Multiple Vitamin (MULTIVITAMIN) tablet   Oral   Take 1 tablet by mouth daily.         Marland Kitchen omeprazole (PRILOSEC) 40 MG capsule   Oral   Take 1 capsule (40 mg  total) by mouth daily.   30 capsule   0   . ondansetron (ZOFRAN-ODT) 4 MG disintegrating tablet      TAKE 2 TABLETS BY MOUTH EVERY 8 HOURS AS NEEDED FOR NAUSEA OR VOMITING   20 tablet   0    BP 136/77  Pulse 81  Temp(Src) 98.7 F (37.1 C) (Oral)  Resp 16  SpO2 100%  LMP 04/27/2013 Physical Exam  Nursing note and vitals reviewed. Constitutional: She is oriented to person, place, and time. She appears well-developed and well-nourished. No distress.  Neck: Normal range of motion. Neck supple.  Cardiovascular: Normal rate.   Pulmonary/Chest: Effort normal. No respiratory distress.  Genitourinary: Guaiac negative stool.  Spreading the buttocks produces severe pain. There is an old hemorrhoid tag, nontender. No external fissuring or bleeding is seen. Attempts to perform digital exam is unsuccessful due to pain in the rectal/mucosal ring  Neurological: She is alert and oriented to person, place, and time.  Skin: Skin is warm and dry.  Psychiatric: She has a normal mood and affect.    ED Course  Procedures (including critical care time) Labs Review Labs Reviewed  OCCULT BLOOD X 1 CARD TO LAB, STOOL  OCCULT BLOOD,  POC DEVICE   Imaging Review No results found.   MDM   1. Rectal pain     I suspect with the pain and sensitivity this is an anal fissure. Unable to obtain a good DRE due to pain. No external laceration observed. anamantle cream 3-4 x/d Diltiazem gel tid F/U with GI on Monday as scheduled.    Janne Napoleon, NP 07/23/13 1147

## 2013-07-23 NOTE — Discharge Instructions (Signed)
Fstula anal  (Anal Fistula) Una fstula anal es un tnel anormal que se desarrolla entre el intestino y la piel cerca de la parte exterior del ano, por donde salen las heces. El ano tiene pequeas glndulas que producen un lquido lubricante. A veces, estas glndulas pueden taparse e infectarse. Esto puede formar una pequea cavidad llena de lquido (absceso). La fstula anal generalmente se desarrolla despus de una infeccin o absceso. Casi siempre la causa es un absceso anal antiguo o actual.  CAUSAS  Aunque la causa de una fstula anal es casi siempre un absceso anal antigo o actual, otras causas pueden ser:   Lisa MoosLa complicacin de Lisa Moreno ciruga.  Traumatismos en el rea rectal.  Radiacin en el rea.  Otras afecciones o enfermedades mdicas, como:   Una enfermedad intestinal inflamatoria crnica, como la enfermedad de Crohn o la colitis ulcerosa.   Cncer de colon o rectal.   Enfermedad diverticular, como la diverticulitis.   Enfermedad de transmisin sexual, como gonorrea, clamidia o sfilis.  Infeccin por VIH o sida.  SNTOMAS   Dolor punzante o constante que puede empeorar al sentarse.   Inflamacin o irritacin alrededor del ano.   Drenaje de pus o sangre por una abertura cerca del ano.   Dolor al Licensed conveyancermover el intestino.  Fiebre o escalofros. DIAGNSTICO  El mdico examinar el rea para encontrar la abertura y el tracto de la fstula. El orificio externo de la fstula anal puede ser visto durante un examen fsico. Otros estudios que pueden indicarle son:   Examen de la zona rectal con Lisa Moreno mano enguantada (examen digital rectal).   El examen con Lisa Moreno sonda o extensin para ayudar a Information systems managerlocalizar el orificio interno de la fstula.   Inyeccin de un medio de contraste en la abertura de la fstula. Podrn indicarle radiografas para encontrar la ubicacin exacta y el conducto de la fstula.   Resonancia magntica o ecografa de la zona anal.  Posiblemente le  indiquen otros estudios para Veterinary surgeonencontrar la causa.   TRATAMIENTO  El tratamiento ms frecuente para la fstula anal es la Azerbaijanciruga. Existen diferentes opciones de Azerbaijanciruga segn la ubicacin y la complejidad de la fstula. Las opciones quirrgicas son:   Lisa AmabileUna fistulotoma. Esta ciruga consiste en la apertura de la fstula y el drenaje del contenido del interior para favorecer la curacin.  Colocacin de setn. Le colocarn un hilo de seda (setn) dentro de la fstula durante una fistulotoma para drenar la infeccin y Biochemist, clinicalfavorecer la curacin.  Procedimiento de colgajo de avance. Se extrae tejido del recto o de la piel alrededor del ano y se une a la abertura de la fstula.  Tapn bioprotsico. Se realiza un tapn en forma de cono del mismo tejido para bloquear la abertura de la fstula. Algunas fstulas anales no requieren Azerbaijanciruga. Un adhesivo de fibrina es una opcin no quirrgica que consiste en inyectar un pegamento para sellar la fstula. Posiblemente le indiquen un antibitico para tratar una infeccin.  INSTRUCCIONES PARA EL CUIDADO EN EL HOGAR   Tome los antibiticos como se le indic. Tmelos todos, aunque se sienta mejor.  Tome slo medicamentos de venta libre o recetados, segn las indicaciones del mdico.Si se lo indican, utilice un laxante o un ablandador de heces.   Consuma una dieta rica en fibra para evitar la constipacin o como lo indique su mdico.  Beba suficiente agua para mantener la orina clara o de color amarillo plido.   Un bao de asiento caliente puede ser calmante y ayudar  a la curacin. Tome baos de asiento calientes durante 15 a 20 minutos, 3 a 4 veces al da para Engineer, materials y las [place].   Siga una excelente higiene para mantener la zona anal lo ms limpia y seca posible. Use papel higinico hmedo o toallitas humedecidas despus de mover el intestino.  SOLICITE ATENCIN MDICA SI:  El dolor aumenta y no se alivia con los medicamentos.  SOLICITE  ATENCIN MDICA DE INMEDIATO SI:   Siente un dolor intenso e intolerable.  Tiene hinchazn, enrojecimiento o lquido en la zona anal.  Siente sensibilidad o calor alrededor de la zona de la lesin.  Siente escalofros o tiene diarrea.  Tiene dificultad para Art gallery manager.   Tiene fiebre o sntomas que persisten durante ms de 2 o 3 das.   Tiene fiebre y los sntomas empeoran de manera sbita.  ASEGRESE DE QUE:   Comprende estas instrucciones.  Controlar su enfermedad.  Solicitar ayuda de inmediato si no mejora o si empeora. Document Released: 03/27/2012 [facility] Patient Information 2014 [place], [place].

## 2013-07-23 NOTE — ED Notes (Signed)
C/o pain and burning in rectal area . Has been under treatment for c diff colitis

## 2013-07-24 NOTE — ED Provider Notes (Signed)
Medical screening examination/treatment/procedure(s) were performed by a resident physician or non-physician practitioner and as the supervising physician I was immediately available for consultation/collaboration.  Kiamesha Samet, MD    Rilya Longo S Rainah Kirshner, MD 07/24/13 1642 

## 2013-08-06 ENCOUNTER — Encounter (HOSPITAL_COMMUNITY): Payer: Self-pay | Admitting: Emergency Medicine

## 2013-08-06 ENCOUNTER — Emergency Department (HOSPITAL_COMMUNITY)
Admission: EM | Admit: 2013-08-06 | Discharge: 2013-08-06 | Disposition: A | Discharge status: Home / Self Care | Payer: No Typology Code available for payment source | Source: Home / Self Care

## 2013-08-06 DIAGNOSIS — M25559 Pain in unspecified hip: Secondary | ICD-10-CM

## 2013-08-06 DIAGNOSIS — M940 Chondrocostal junction syndrome [Tietze]: Secondary | ICD-10-CM

## 2013-08-06 DIAGNOSIS — R197 Diarrhea, unspecified: Secondary | ICD-10-CM

## 2013-08-06 DIAGNOSIS — A084 Viral intestinal infection, unspecified: Secondary | ICD-10-CM

## 2013-08-06 DIAGNOSIS — A088 Other specified intestinal infections: Secondary | ICD-10-CM

## 2013-08-06 LAB — POCT URINALYSIS DIP (DEVICE)
BILIRUBIN URINE: NEGATIVE
Glucose, UA: NEGATIVE mg/dL
Hgb urine dipstick: NEGATIVE
KETONES UR: NEGATIVE mg/dL
LEUKOCYTES UA: NEGATIVE
Nitrite: NEGATIVE
Protein, ur: 30 mg/dL — AB
Specific Gravity, Urine: 1.025 (ref 1.005–1.030)
Urobilinogen, UA: 0.2 mg/dL (ref 0.0–1.0)
pH: 6 (ref 5.0–8.0)

## 2013-08-06 LAB — POCT I-STAT, CHEM 8
BUN: 13 mg/dL (ref 6–23)
CHLORIDE: 101 meq/L (ref 96–112)
Calcium, Ion: 1.17 mmol/L (ref 1.12–1.23)
Creatinine, Ser: 0.7 mg/dL (ref 0.50–1.10)
Glucose, Bld: 127 mg/dL — ABNORMAL HIGH (ref 70–99)
HCT: 42 % (ref 36.0–46.0)
Hemoglobin: 14.3 g/dL (ref 12.0–15.0)
Potassium: 4 mEq/L (ref 3.7–5.3)
SODIUM: 141 meq/L (ref 137–147)
TCO2: 28 mmol/L (ref 0–100)

## 2013-08-06 MED ORDER — HYDROCODONE-ACETAMINOPHEN 5-325 MG PO TABS: 1.0000 | ORAL_TABLET | ORAL | Status: DC | PRN

## 2013-08-06 NOTE — ED Notes (Signed)
Voided prior to coming to treatment room-unable to void

## 2013-08-06 NOTE — ED Notes (Signed)
Provider wrote for a narcotic.  Patient had left when david presented script.  Ramon, cma called patient and instructed her to return.  Patient returned to receive the hydrocodone script and instructions provided most recently

## 2013-08-06 NOTE — ED Provider Notes (Signed)
CSN: [id number]     Arrival date & time 08/06/13  1244 History   First MD Initiated Contact with Patient 08/06/13 1449     Chief Complaint  Patient presents with  . Abdominal Pain   (Consider location/radiation/quality/duration/timing/severity/associated sxs/prior Treatment) HPI Comments: 38 year old obese Hispanic female complaining of abdominal pain, diarrhea and left lateral chest and abdominal pain radiating from the mid chest to the hip. Is also complaining of nausea for which she is taking Zofran. The musculoskeletal type pain started a few days ago in the diarrhea began approximately 14 hours prior to arrival at the urgent care. She has had 12 episodes of loose stools in the past 14 hours. She said nausea but no vomiting. When lying on her right side she describes pain in the shape of a #7 across the abdomen and down. When lying supine the anatomy apparently changes due to the obesity and is actually located along the left costal margin radiating laterally and inferiorly to the left hip.     Past Medical History  Diagnosis Date  . Asthma   . Gestational diabetes    Past Surgical History  Procedure Laterality Date  . Breast reduction surgery     No family history on file. History  Substance Use Topics  . Smoking status: Never Smoker   . Smokeless tobacco: Not on file  . Alcohol Use: No   OB History   Grav Para Term Preterm Abortions TAB SAB Ect Mult Living                 Review of Systems  Constitutional: Positive for activity change and appetite change. Negative for fever.  HENT: Negative.   Respiratory: Negative.   Cardiovascular: Negative.   Gastrointestinal: Positive for nausea, abdominal pain, diarrhea and abdominal distention. Negative for vomiting, constipation and blood in stool.  Genitourinary: Negative.   Musculoskeletal: Negative.   Skin: Negative.   Neurological: Negative.   Psychiatric/Behavioral: Negative.     Allergies  Review of patient's  allergies indicates no known allergies.  Home Medications   Prior to Admission medications   Medication Sig Start Date End Date Taking? Authorizing Provider  albuterol (PROVENTIL HFA;VENTOLIN HFA) 108 (90 BASE) MCG/ACT inhaler Inhale 2 puffs into the lungs every 6 (six) hours as needed for wheezing.    Historical Provider, MD  albuterol (PROVENTIL) (5 MG/ML) 0.5% nebulizer solution Take 0.5 mLs (2.5 mg total) by nebulization every 4 (four) hours as needed for wheezing or shortness of breath. 09/18/12   Noland Fordyce, PA-C  aspirin 325 MG tablet Take 325 mg by mouth 2 (two) times daily.    Historical Provider, MD  dicyclomine (BENTYL) 20 MG tablet Take 0.5 tablets (10 mg total) by mouth 4 (four) times daily -  before meals and at bedtime. 06/27/13   Reyne Dumas, MD  diltiazem 2 % GEL Apply small amount of gel to the rectum tid 07/23/13   Janne Napoleon, NP  famotidine (PEPCID) 20 MG tablet TAKE 1 TABLET BY MOUTH 2 TIMES DAILY. 05/02/13   Thurnell Lose, MD  fluticasone (FLONASE) 50 MCG/ACT nasal spray Place 2 sprays into the nose daily.    Historical Provider, MD  gabapentin (NEURONTIN) 300 MG capsule Take 1 capsule (300 mg total) by mouth 3 (three) times daily. 05/22/13   Angelica Chessman, MD  Lidocaine-Hydrocortisone Ace 3-0.5 % KIT Place rectally 3-4 times a day 07/23/13   Janne Napoleon, NP  loratadine (CLARITIN) 10 MG tablet Take 10 mg by mouth daily.  Historical Provider, MD  medroxyPROGESTERone (DEPO-PROVERA) 150 MG/ML injection Inject 150 mg into the muscle every 3 (three) months.    Historical Provider, MD  Multiple Vitamin (MULTIVITAMIN) tablet Take 1 tablet by mouth daily.    Historical Provider, MD  omeprazole (PRILOSEC) 40 MG capsule Take 1 capsule (40 mg total) by mouth daily. 06/06/13   Reyne Dumas, MD  ondansetron (ZOFRAN-ODT) 4 MG disintegrating tablet TAKE 2 TABLETS BY MOUTH EVERY 8 HOURS AS NEEDED FOR NAUSEA OR VOMITING 06/18/13   Angelica Chessman, MD   BP 148/88  Pulse 100  Temp(Src)  99.2 F (37.3 C)  Resp 20  SpO2 100% Physical Exam  Nursing note and vitals reviewed. Constitutional: She is oriented to person, place, and time. She appears well-developed and well-nourished.  Moderate to severe obesity  Eyes: Conjunctivae and EOM are normal.  Neck: Normal range of motion. Neck supple.  Cardiovascular: Normal rate, regular rhythm, normal heart sounds and intact distal pulses.   Pulmonary/Chest: Effort normal and breath sounds normal. No respiratory distress. She has no wheezes. She has no rales. She exhibits tenderness.   There is severe tenderness along the antero-lateral chest wall beginning at the costal margin from the midclavicular line staining laterally and inferiorly involving the entire left lower rib cage. This tenderness extends to the left hip where the tenderness is also moderate to severe. Again, no abdominal tenderness. This exam was repeated several times to assure proper anatomical identification and to reproduce the pain for which he presents.  Abdominal: Soft. Bowel sounds are normal. She exhibits no distension and no mass. There is no tenderness. There is no rebound and no guarding.  Obese Deep Palpation of the entire abdomen reveals no tenderness.   Musculoskeletal: She exhibits tenderness. She exhibits no edema.  Tenderness to the left lateral hip  Neurological: She is alert and oriented to person, place, and time. She exhibits normal muscle tone.  Skin: Skin is warm and dry. No rash noted.  Psychiatric: She has a normal mood and affect.    ED Course  Procedures (including critical care time) Labs Review Labs Reviewed  POCT URINALYSIS DIP (DEVICE) - Abnormal; Notable for the following:    Protein, ur 30 (*)    All other components within normal limits  POCT I-STAT, CHEM 8 - Abnormal; Notable for the following:    Glucose, Bld 127 (*)    All other components within normal limits    Results for orders placed during the hospital encounter of  08/06/13  POCT URINALYSIS DIP (DEVICE)      Result Value Ref Range   Glucose, UA NEGATIVE  NEGATIVE mg/dL   Bilirubin Urine NEGATIVE  NEGATIVE   Ketones, ur NEGATIVE  NEGATIVE mg/dL   Specific Gravity, Urine 1.025  1.005 - 1.030   Hgb urine dipstick NEGATIVE  NEGATIVE   pH 6.0  5.0 - 8.0   Protein, ur 30 (*) NEGATIVE mg/dL   Urobilinogen, UA 0.2  0.0 - 1.0 mg/dL   Nitrite NEGATIVE  NEGATIVE   Leukocytes, UA NEGATIVE  NEGATIVE  POCT I-STAT, CHEM 8      Result Value Ref Range   Sodium 141  137 - 147 mEq/L   Potassium 4.0  3.7 - 5.3 mEq/L   Chloride 101  96 - 112 mEq/L   BUN 13  6 - 23 mg/dL   Creatinine, Ser 0.70  0.50 - 1.10 mg/dL   Glucose, Bld 127 (*) 70 - 99 mg/dL   Calcium, Ion 1.17  1.12 - 1.23 mmol/L   TCO2 28  0 - 100 mmol/L   Hemoglobin 14.3  12.0 - 15.0 g/dL   HCT 42.0  36.0 - 46.0 %   Imaging Review No results found.   MDM   1. Costochondritis, acute   2. Hip pain   3. Diarrhea   4. Viral gastroenteritis     The patient has 2 separate conditions. Once musculoskeletal the other is gastrointestinal. When supine the the pt points to the L anterior chest below the breast, L anterolateral costal margin and L hip as the source of pain.  No abdominal tenderness. No acute or surgical abdomen. The greatest amt of pain is musculoskeletal.  I believe this is coincidental to the the intermittent anterior abdominal cramping associated with the recent onset of diarrhea. She is taking zofran for nausea and Immodium for diarrhea. Continue this. No solid foods for 24h. No dairy. Drink clear liquids only and then slowly advance diet.  Call your PCP for apontment and f/u. If worse or new sx's go to the ED. Pt is stable and can be d/c home       Janne Napoleon, NP 08/06/13 1553

## 2013-08-06 NOTE — ED Notes (Signed)
Patient describes abdominal pain on left that radiates through to back.  C/o cramping and diarrhea and headache.

## 2013-08-06 NOTE — Discharge Instructions (Signed)
Dolor de la pared torcica (Chest Wall Pain) Dolor en la pared torcica es dolor en o alrededor de los huesos y msculos de su pecho. Podrn pasar hasta 6 semanas hasta que comience a mejorar. Puede demorar ms tiempo si es fsicamente activo en su Aleen Campitrabajo y Creteactividades.  CAUSAS  El dolor en el pecho puede aparecer sin motivo. No obstante, algunas causas pueden ser:   Neomia DearUna enfermedad viral como la gripe.  Traumatismos.  Tos.  La prctica de ejercicios.  Artritis.  Fibromialgia  Culebrilla. INSTRUCCIONES PARA EL CUIDADO DOMICILIARIO  Evite hacer actividad fsica extenuante. Trate de no esforzarse o Electrical engineerrealizar actividades que le causen dolor. Aqu se incluyen las actividades en las que Bots[place]ausa los msculos del trax, los abdominales y los msculos laterales, especialmente si debe levantar objetos pesados.  Aplique hielo sobre la zona dolorida.  Ponga el hielo en una bolsa plstica.  Colquese una toalla entre la piel y la bolsa de hielo.  Deje la bolsa de hielo durante 15 a 20 minutos por hora, durante los primeros 2 [address]  [address].  Utilice los medicamentos de venta libre o de prescripcin para Chief Technology Officerel dolor, Environmental health practitionerel malestar o la Hokahfiebre, segn se lo indique el profesional que lo asiste. SOLICITE ATENCIN MDICA DE INMEDIATO SI:  El dolor aumenta o siente muchas molestias.  Tiene fiebre.  El dolor de Ohoopeepecho empeora.  Desarrolla nuevos e inexplicables sntomas.  Tiene nuseas o vmitos.  Berenice Primasranspira o se siente mareado.  Tiene tos con flema (esputo), o tose con sangre. EST SEGURO QUE:   Comprende las instrucciones para el alta mdica.  Controlar su enfermedad.  Solicitar atencin mdica de inmediato segn las indicaciones. Document Released: 05/22/2006 Document Revised: 07/03/2011 [facility] Patient Information 2014 [place]ExitCare, [place]LL[place].  Costocondritis (Costochondritis) La costocondritis a veces llamada sndrome de [place]ietze es la hinchazn e irritacin (inflamacin) del tejido Building surveyor(cartlago) que  une las costillas con el (esternn). Esto causa dolor en el pecho y la zona de las Philocostillas. La costocondritis generalmente desaparece por s misma con el tiempo. Podr tardar Elaina Hoopshasta 6 semanas en curarse o ms, especialmente si no puede restringir sus actividades. CAUSAS  Algunos casos de costocondritis no tienen causa conocida. Las causas posibles son:  Quincy SheehanLesiones (traumatismos).  Ejercicios o actividades relacionadas con Toys ''R'' Uslevantar pesos.  Tos intensa. SIGNOS Y SNTOMAS  Dolor y sensibilidad en el rea de las Syracusecostillas.  Dolor que empeora al toser o respirar profundamente.  Dolor que empeora con movimientos especficos. DIAGNSTICO  El mdico le preguntar acerca de sus sntomas y le har un examen fsico. Podr indicarle radiografas para descartar otros problemas. TRATAMIENTO  La costocondritis generalmente desaparece por s misma con el tiempo. El mdico podr recetar algunos medicamentos para Primary school teachercalmar el dolor. INSTRUCCIONES PARA EL CUIDADO EN EL HOGAR   Evite la actividad fsica extenuante. Trate de no esforzar las Guardian Life Insurancecostillas durante las actividades habituales. Aqu se incluyen las [address] Johnson Placeactividades en las que [place]otswanausa los msculos del pecho, abdominales y los msculos laterales, especialmente si debe levantar peso.  Aplique hielo en la zona afectada durante los primeros 2 das despus del inicio del dolor.  Ponga el hielo en una bolsa plstica.  Colquese una toalla entre la piel y la bolsa de hielo.  Deje el hielo durante 20 minutos, y aplquelo 2-3 veces por Futures traderda.  Tome slo medicamentos de venta libre o recetados, segn las indicaciones del mdico. SOLICITE ATENCIN MDICA SI:  Tiene hinchazn o irritacin en las articulaciones de las costillas. Estos son signos de infeccin.  El dolor no desaparece  aunque haga reposo o tome medicamentos para Chief Technology Officerel dolor. SOLICITE ATENCIN MDICA DE INMEDIATO SI:   El dolor aumenta o siente muchas molestias.  Le falta el aire o tiene dificultad para  respirar.  Tose y escupe sangre.  Siente falta de aire o dolor en el pecho, sudoracin o vmitos.  Tiene fiebre o sntomas persistentes durante ms de 2 - 3 das.  Tiene fiebre y los sntomas empeoran repentinamente. ASEGRESE DE QUE:   Comprende estas instrucciones.  Controlar su afeccin.  Recibir ayuda de inmediato si no mejora o si empeora. Document Released: 01/18/2005 Document Revised: 01/29/2013 [facility]Exit[facility] Patient Information 2014 [place]ExitCare, [place]LLC.  Diarrea  (Diarrhea) La diarrea consiste en evacuaciones intestinales frecuentes, blandas o acuosas. Puede hacerlo sentir dbil y deshidratado. La deshidratacin puede hacer que se sienta cansado, sediento, tener la boca seca y que haya disminucin de Fredoniaorina, que a menudo es de color amarillo oscuro. La diarrea es un signo de otro problema, generalmente una infeccin que no durar Con-waymucho tiempo. En la International Business Machinesmayora de los casos, la diarrea dura tpicamente 2 a 3 das. Sin embargo, puede durar ms tiempo si se trata de un signo de algo ms serio. Es importante tratar la diarrea como lo indique su mdico para disminuir o prevenir futuros episodios de Research scientist (medical)diarrea.  CAUSAS  Algunas causas comunes son:   Infecciones gastrointestinales causadas por virus, bacterias o parsitos.  Intoxicacin alimentaria o alergias a los alimentos.  Ciertos medicamentos, como los antibiticos, quimioterapia y laxantes.  Edulcorantes artificiales y fructosa.  Los trastornos Sprint Nextel Corporationdigestivos. INSTRUCCIONES PARA EL CUIDADO EN EL HOGAR   Asegure una adecuada ingesta de lquidos (hidratacin). Evite los lquidos que contengan azcares simples o las bebidas deportivas, los jugos de frutas, los productos derivados de la leche entera y [place] gaseosas. Si bebe la cantidad suficiente de lquidos, la orina debe ser clara o amarillo plido. Una solucin de rehidratacin oral se puede comprar en las farmacias, en las tiendas minoristas y por Internet. Se puede preparar una solucin de  rehidratacin oral casera con los siguientes ingredientes:     cucharadita de sal.   cucharadita de bicarbonato.   de cucharadita de sal sustituta que contenga cloruro de potasio.  1  cucharada de azcar.  1l (34 onzas) de agua.  Ciertos alimentos y bebidas pueden aumentar la velocidad a la que el alimento se mueve a travs del tracto gastrointestinal (GI). Estos alimentos y bebidas deben evitarse e incluyen:  Bebidas alcohlicas y con cafena.  Alimentos ricos en fibra, como frutas y verduras, nueces, semillas, panes y cereales integrales.  Alimentos y bebidas endulzados con alcoholes de azcar, tales como xilitol, sorbitol, y manitol.  Algunos alimentos pueden ser bien tolerados y puede ayudar a Lehman Brothersespesar las heces, incluyendo:  Alimentos con almidn, como arroz, pan, pasta, cereales bajos en azcar, avena, smola de maz, papas al horno, galletas y panecillos.  Bananas.  Pur de Praxairmanzana.  Agregue alimentos ricos en probiticos a la dieta del nio para ayudar a aumentar las bacterias saludables en el tracto gastrointestinal, como el yogur y productos lcteos fermentados.  Lvese bien las manos despus de cada episodio de diarrea.  Tome slo medicamentos de venta libre o recetados, segn las indicaciones del mdico.  Aulanderome un bao caliente para ayudar a disminuir ardor o dolor por los episodios frecuentes de diarrea. SOLICITE ATENCIN MDICA DE INMEDIATO SI:   No puede retener los lquidos.  Tiene vmitos persistentes.  Observa sangre en la materia fecal, o las heces son negras y de Water engineeraspecto  alquitranado.  No hay emisin de [place] durante 6 a 8 horas o elimina una pequea cantidad de [place].  Tiene dolor abdominal que aumenta o se localiza.  Est muy mareado o se desvanece.  Sufre un dolor intenso de [place].  La diarrea empeora o no mejora.  Tiene fiebre o sntomas que persisten durante ms de 2 o 3 das.  Tiene fiebre y los sntomas empeoran de manera  sbita. ASEGRESE DE QUE:   Comprende estas instrucciones.  Controlar su enfermedad.  Solicitar ayuda de inmediato si no mejora o si empeora. Document Released: 04/10/2005 Document Revised: 03/27/2012 [facility] Patient Information 2014 [place], [place].  Dolor msculoesqueltico (Musculoskeletal Pain) El dolor musculoesqueltico se siente en huesos y msculos. El dolor puede ocurrir en cualquier parte del cuerpo. El profesional que lo asiste podr tratarlo sin Geologist, engineering causa del dolor. Lo tratar Time Warner de laboratorio (sangre y [place]), las radiografas y otros estudios sean normales. La causa de estos dolores puede ser un virus.  CAUSAS Generalmente no existe una causa definida para este trastorno. Tambin el Citigroup puede deberse a la McRae. En la actividad excesiva se incluye el hacer ejercicios fsicos muy intensos cuando no se est en buena forma. El dolor de huesos tambin puede deberse a cambios climticos. Los huesos son sensibles a los cambios en la presin atmosfrica. INSTRUCCIONES PARA EL CUIDADO DOMICILIARIO  Para proteger su privacidad, no se entregarn los [employer] Sher[employer] pruebas por telfono. Asegrese de conseguirlos. Consulte el modo en que podr obtenerlos si no se lo han informado. Es su responsabilidad contar con los Lubrizol Corporation.  Utilice los medicamentos de venta libre o de prescripcin para Chief Technology Officer, Environmental health practitioner o la [place], segn se lo indique el profesional que lo asiste. Si le han administrado medicamentos, no conduzca, no opere maquinarias ni Diplomatic Services operational officer, y tampoco firme documentos legales durante 24 horas. No beba alcohol. No tome pldoras para dormir ni otros medicamentos que Museum/gallery curator.  Podr seguir con todas las actividades a menos que stas le ocasionen ms Merck & Co. Cuando el dolor disminuya, es importante que gradualmente reanude toda la rutina habitual. Retome las actividades  comenzando lentamente. Aumente gradualmente la intensidad y la duracin de sus actividades o del ejercicio.  Durante los perodos de dolor intenso, el reposo en cama puede ser beneficioso. Recustese o sintese en la posicin que le sea ms cmoda.  Coloque hielo sobre la zona afectada.  Ponga hielo en Lucile Shutters.  Colquese una toalla entre la piel y la bolsa de hielo.  Aplique el hielo durante 10 a 20 minutos 3  4 veces por da.  Si el dolor empeora, o no desaparece puede ser Northeast Utilities repetir las pruebas o Education officer, environmental nuevos exmenes. El profesional que lo asiste podr requerir investigar ms profundamente para Veterinary surgeon causa posible. SOLICITE ATENCIN MDICA DE INMEDIATO SI:  Siente que el dolor empeora y no se alivia con los medicamentos.  Siente dolor en el pecho asociado a falta de aire, sudoracin, nuseas o vmitos.  El dolor se localiza en el abdomen.  Comienza a sentir nuevos sntomas que parecen ser diferentes o que lo preocupan. ASEGRESE DE QUE:   Comprende las instrucciones para el alta mdica.  Controlar su enfermedad.  Solicitar atencin mdica de inmediato segn las indicaciones. Document Released: 01/18/2005 Document Revised: 07/03/2011 [facility] Patient Information 2014 [place], [place].

## 2013-08-07 NOTE — ED Provider Notes (Signed)
Medical screening examination/treatment/procedure(s) were performed by non-physician practitioner and as supervising physician I was immediately available for consultation/collaboration.  Oneta Sigman, M.D.  Jp [place] C Dequon Schnebly, MD 08/07/13 1407 

## 2013-08-11 ENCOUNTER — Other Ambulatory Visit: Payer: Self-pay | Admitting: Internal Medicine

## 2013-08-11 ENCOUNTER — Ambulatory Visit: Payer: Self-pay | Admitting: Internal Medicine

## 2013-08-11 DIAGNOSIS — K219 Gastro-esophageal reflux disease without esophagitis: Secondary | ICD-10-CM

## 2013-08-11 DIAGNOSIS — R110 Nausea: Secondary | ICD-10-CM

## 2013-08-25 ENCOUNTER — Ambulatory Visit: Payer: Self-pay | Admitting: Internal Medicine

## 2013-11-04 ENCOUNTER — Encounter (HOSPITAL_COMMUNITY): Payer: Self-pay | Admitting: Emergency Medicine

## 2013-11-04 ENCOUNTER — Emergency Department (HOSPITAL_COMMUNITY): Payer: No Typology Code available for payment source

## 2013-11-04 ENCOUNTER — Emergency Department (HOSPITAL_COMMUNITY)
Admission: EM | Admit: 2013-11-04 | Discharge: 2013-11-04 | Disposition: A | Discharge status: Home / Self Care | Payer: Self-pay | Attending: Emergency Medicine | Admitting: Emergency Medicine

## 2013-11-04 DIAGNOSIS — J45909 Unspecified asthma, uncomplicated: Secondary | ICD-10-CM | POA: Insufficient documentation

## 2013-11-04 DIAGNOSIS — Z8632 Personal history of gestational diabetes: Secondary | ICD-10-CM | POA: Insufficient documentation

## 2013-11-04 DIAGNOSIS — M5380 Other specified dorsopathies, site unspecified: Secondary | ICD-10-CM | POA: Insufficient documentation

## 2013-11-04 DIAGNOSIS — Z79899 Other long term (current) drug therapy: Secondary | ICD-10-CM | POA: Insufficient documentation

## 2013-11-04 DIAGNOSIS — M489 Spondylopathy, unspecified: Secondary | ICD-10-CM

## 2013-11-04 DIAGNOSIS — M62838 Other muscle spasm: Secondary | ICD-10-CM | POA: Insufficient documentation

## 2013-11-04 DIAGNOSIS — M5090 Cervical disc disorder, unspecified, unspecified cervical region: Secondary | ICD-10-CM | POA: Insufficient documentation

## 2013-11-04 MED ORDER — METHOCARBAMOL 500 MG PO TABS: 500.0000 mg | ORAL_TABLET | Freq: Three times a day (TID) | ORAL | Status: DC | PRN

## 2013-11-04 MED ORDER — OXYCODONE-ACETAMINOPHEN 5-325 MG PO TABS: 1.0000 | ORAL_TABLET | Freq: Four times a day (QID) | ORAL | Status: DC | PRN

## 2013-11-04 MED ORDER — KETOROLAC TROMETHAMINE 30 MG/ML IJ SOLN
30.0000 mg | Freq: Once | INTRAMUSCULAR | Status: AC
  Administered 2013-11-04: 30 mg via INTRAMUSCULAR
  Filled 2013-11-04: qty 1

## 2013-11-04 MED ORDER — NAPROXEN 500 MG PO TABS: 500.0000 mg | ORAL_TABLET | Freq: Two times a day (BID) | ORAL | Status: DC | PRN

## 2013-11-04 NOTE — ED Provider Notes (Signed)
CSN: [id number]     Arrival date & time 11/04/13  1930 History   First MD Initiated Contact with Patient 11/04/13 1950     This chart was scribed for non-physician practitioner working with Lyanne Co, MD by Arlan Organ, ED Scribe. This patient was seen in room TR10C/TR10C and the patient's care was started at 7:56 PM.   Chief Complaint  Patient presents with  . Back Pain   Patient is a 38 y.o. female presenting with back pain. The history is provided by the patient. No language interpreter was used.  Back Pain Pain location: cervical. Quality:  Unable to specify Radiates to:  Does not radiate Pain severity:  Severe Pain is:  Unable to specify Onset quality:  Gradual Duration:  2 days Timing:  Constant Progression:  Worsening Chronicity:  New Relieved by:  None tried Worsened by:  Nothing tried Ineffective treatments: gabapentin. Associated symptoms: no abdominal pain, no bladder incontinence, no bowel incontinence, no chest pain, no dysuria, no fever, no headaches, no leg pain, no numbness, no paresthesias, no perianal numbness, no tingling and no weakness   Risk factors: obesity     HPI Comments: Lisa Moreno is a 38 y.o. Female with a PMHx of asthma who presents to the Emergency Department complaining of constant, moderate, nonradiating bilateral neck and shoulder pain x 2-3 days that has progressively worsened.  She describes discomfort as "someone stretching me out". No recent injury or heavy lifting. Pt states this pain is exacerbated with all movements. No alleviating factors at this time.She has not tried anything OTC or any home remedies to improve symptoms. No chest pain, palpitations, nausea, vomiting, dysuria, rhinorrhea, HA, blurry vision, numbness, paresthesia, or loss of sensation. 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 She is currently on Gabapentin for possible Fibromyalgia. She has no other pertinent past medical  history. No other concerns this visit.  Past Medical History  Diagnosis Date  . Asthma   . Gestational diabetes    Past Surgical History  Procedure Laterality Date  . Breast reduction surgery     No family history on file. History  Substance Use Topics  . Smoking status: Never Smoker   . Smokeless tobacco: Not on file  . Alcohol Use: No   OB History   Grav Para Term Preterm Abortions TAB SAB Ect Mult Living                 Review of Systems  Constitutional: Negative for fever and chills.  Eyes: Negative for photophobia and visual disturbance.  Respiratory: Negative for chest tightness and shortness of breath.   Cardiovascular: Negative for chest pain.  Gastrointestinal: Negative for nausea, vomiting, abdominal pain and bowel incontinence.  Genitourinary: Negative for bladder incontinence, dysuria, urgency, hematuria, flank pain, difficulty urinating and menstrual problem.  Musculoskeletal: Positive for arthralgias (Bilateral shoulder), back pain, neck pain and neck stiffness.  Skin: Negative for color change and rash.  Neurological: Negative for dizziness, tingling, weakness, numbness, headaches and paresthesias.  Psychiatric/Behavioral: Negative for confusion.  10 Systems reviewed and are negative for acute change except as noted in the HPI.     Allergies  Review of patient's allergies indicates no known allergies.  Home Medications   Prior to Admission medications   Medication Sig Start Date End Date Taking? Authorizing Provider  albuterol (PROVENTIL HFA;VENTOLIN HFA) 108 (90 BASE) MCG/ACT inhaler Inhale 2 puffs into the lungs every 6 (six) hours as  needed for wheezing.   Yes Historical Provider, MD  albuterol (PROVENTIL) (5 MG/ML) 0.5% nebulizer solution Take 0.5 mLs (2.5 mg total) by nebulization every 4 (four) hours as needed for wheezing or shortness of breath. 09/18/12  Yes Junius Finner, PA-C  gabapentin (NEURONTIN) 300 MG capsule Take 300 mg by mouth daily.    Yes Historical Provider, MD  HYDROcodone-acetaminophen (NORCO/VICODIN) 5-325 MG per tablet Take 1 tablet by mouth every 4 (four) hours as needed. 08/06/13  Yes Hayden Rasmussen, NP  omeprazole (PRILOSEC) 40 MG capsule TAKE 1 CAPSULE BY MOUTH DAILY.   Yes Jeanann Lewandowsky, MD  methocarbamol (ROBAXIN) 500 MG tablet Take 1 tablet (500 mg total) by mouth every 8 (eight) hours as needed for muscle spasms. 11/04/13   Avriel Kandel Strupp Camprubi-Soms, PA-C  naproxen (NAPROSYN) 500 MG tablet Take 1 tablet (500 mg total) by mouth 2 (two) times daily as needed for mild pain, moderate pain or headache (TAKE WITH MEALS.). 11/04/13   Sakia Schrimpf Strupp Camprubi-Soms, PA-C  oxyCODONE-acetaminophen (PERCOCET) 5-325 MG per tablet Take 1-2 tablets by mouth every 6 (six) hours as needed for severe pain. 11/04/13   Erdem Naas Strupp Camprubi-Soms, PA-C   Triage Vitals: BP 143/89  Pulse 98  Temp(Src) 98.3 F (36.8 C) (Oral)  Ht 5\' 2"  (1.575 m)  Wt 263 lb 4.8 oz (119.432 kg)  BMI 48.15 kg/m2  SpO2 97%   Physical Exam  Nursing note and vitals reviewed. Constitutional: She is oriented to person, place, and time. Vital signs are normal. She appears well-developed and well-nourished. No distress.  VSS, NAD  HENT:  Head: Normocephalic and atraumatic.  Mouth/Throat: Oropharynx is clear and moist and mucous membranes are normal.  Eyes: Conjunctivae and EOM are normal. Pupils are equal, round, and reactive to light.  Neck: Muscular tenderness present. No spinous process tenderness present. Decreased range of motion (secondary to pain) present.    Paracervical muscle TTP, no midline TTP or bony deformity/crepitus/step offs. ROM limited due to pain  Cardiovascular: Normal rate, intact distal pulses and normal pulses.   Intact distal pulses  Pulmonary/Chest: Effort normal.  Abdominal: Normal appearance. She exhibits no distension.  Musculoskeletal:       Right shoulder: Normal.       Left shoulder: Normal.       Cervical back:  She exhibits decreased range of motion, tenderness, pain and spasm. She exhibits no swelling and no deformity.       Thoracic back: Normal.       Lumbar back: Normal.       Right upper arm: Normal.       Left upper arm: Normal.  cspine as described above. Remaining spinal levels free of TTP or spasm. B/l shoulders free of TTP, no bony deformity, neg empty can test, neg apley scratch, neg int/ext rotation with resistance. Strength 5/5 in all extremities, neurovascularly intact. FROM in B/L upper extremities  Neurological: She is alert and oriented to person, place, and time. She has normal strength. No cranial nerve deficit or sensory deficit.  CNII-XII grossly intact, sensation grossly intact in all extremities, strength 5/5 in all extremities  Skin: Skin is warm, dry and intact. No bruising and no rash noted.  Psychiatric: She has a normal mood and affect.    ED Course  Procedures (including critical care time)  DIAGNOSTIC STUDIES: Oxygen Saturation is 97% on RA, Adequate by my interpretation.    COORDINATION OF CARE: 7:57 PM- Will order DG Cervical Spine Complete. Will give Toradol. Discussed treatment plan  with pt at bedside and pt agreed to plan.     Labs Review Labs Reviewed - No data to display  Imaging Review Dg Cervical Spine Complete  11/04/2013   CLINICAL DATA:  Lower neck pain. Limited range of motion. No known injury.  EXAM: CERVICAL SPINE  4+ VIEWS  COMPARISON:  08/04/2011  FINDINGS: There is no evidence of acute fracture or traumatic subluxation. Spinal alignment is similar to previous, with slight anterolisthesis at C4-5, where there is also mild disc narrowing - presumably degenerative. Minimal spondylotic spurring to the C7 vertebral body. No osseous foraminal stenosis. There is no evidence of endplate erosion or bone lesion. No prevertebral swelling.  IMPRESSION: 1.  No acute osseous findings. 2. Chronic mild disc narrowing and anterolisthesis at C4-5.   Electronically  Signed   By: Tiburcio PeaJonathan  Watts M.D.   On: 11/04/2013 21:31     EKG Interpretation None      MDM   Final diagnoses:  Cervical spine disease  Muscle spasms of neck    Lisa Moreno is a 38 y.o. female with a hx of fibromyalgia who works as a Radio broadcast assistantnail tech presents with cervical spine pain unrelieved by gabapentin that began Thursday. Neurovascularly intact, b/l paraspinous muscle TTP. Xray showing stable cervical disc disease changes and anterolisthesis, which is chronic and unchanged. Discussed pain management with percocet, robaxin, and naprosyn, as well as f/up with neurosurgeon to discuss further tx options. No s/sx of central cord compression, do not feel further imaging is indicated at this time. Pt without a ride, given toradol with minimal relief, but pt agreeable to d/c with rx for pain meds and not getting them here today. I explained the diagnosis and have given explicit precautions to return to the ER including for any other new or worsening symptoms. The patient understands and accepts the medical plan as it's been dictated and I have answered their questions. Discharge instructions concerning home care and prescriptions have been given. The patient is STABLE and is discharged to home in good condition.  I personally performed the services described in this documentation, which was scribed in my presence. The recorded information has been reviewed and is accurate.   BP 127/81  Pulse 80  Temp(Src) 98.3 F (36.8 C) (Oral)  Resp 20  Ht 5\' 2"  (1.575 m)  Wt 263 lb 4.8 oz (119.432 kg)  BMI 48.15 kg/m2  SpO2 98%    Celanese CorporationMercedes Strupp Camprubi-Soms, PA-C 11/05/13 [phone]

## 2013-11-04 NOTE — ED Notes (Signed)
Pt is parked in upper parking lot and needs assistance to car, called security for assistance, they will pick pt up at ED doors.

## 2013-11-04 NOTE — Discharge Instructions (Signed)
Your xray shows that you have some disc disease in your neck, which has flared up and caused some spasms in your neck. Use the naprosyn and percocet as directed for pain, and use the muscle relaxer Robaxin for spasms. These will make you drowsy, do not drive while taking them. Use heat to help with the spasms. See the neurosurgeon to discuss further options. Return to the emergency department if any changes or worsening of symptoms occurs.   Terapia con calor  (Heat Therapy)  La terapia con calor puede ayudar a aliviar los msculos y las articulaciones doloridos, tensos y rgidos. El calor no debe aplicarse en lesiones nuevas. Espere por lo menos a que pasen 48 horas despus de una lesin antes de emplear la terapia con calor. El calor tampoco debe usarse para calmar las molestias o el dolor que se produce inmediatamente despus de realizar [place]. Si an Electronics engineer o rigidez 3 horas despus de Buyer, retail actividad, Transport planner. PRECAUCIONES  Una temperatura muy alta o una exposicin prolongada al calor puede causar quemaduras. Sea cauto con la terapia de calor para evitar quemar la piel. Si usted tiene [employer], nouse calor hasta que haya hablado con su mdico:   Mala circulacin.  Heridas que se estn curando o piel con cicatrices en la zona en la que va a tratar.  Diabetes, enfermedades cardacas o hipertensin arterial.  Entumecimiento de la zona a tratar.  Hinchazn inusual de la zona a tratar.  Infecciones activas.  Cogulos sanguneos.  Cncer.  Incapacidad para comunicar la respuesta al [employer] Se incluye a los nios pequeos y las personas con [address] INSTRUCCIONES PARA EL CUIDADO EN EL HOGAR  Compresas hmedas calientes   Remoje una toalla limpia en agua caliente y extraiga el agua extra. La temperatura del agua debe ser confortable para la la piel.  Ponga la toalla tibia y hmeda en una bolsa de plstico.  Coloque una  toalla delgada y seca entre la piel y la bolsa.  Aplique la bolsa con calor en la zona durante 5 minutos y controle su piel. La piel puede estar de color rosa pero no debe estar roja.  Deje la almohadilla trmica en el rea durante 20 a 30 minutos.  Repita esto cada 2 a 4 horas mientras est despierto. No utilice calor mientras usted est durmiendo. Bao de agua caliente   Llene una baera con agua tibia. La temperatura del agua debe ser confortable para la la piel.  Coloque la parte afectada del cuerpo en la baera.  Remoje la zona durante 20 a 40 minutos.  Repita siempre que lo necesite. Bolsa de agua caliente   [place] la bolsa hasta la mitad con agua caliente.  Extraiga el exceso de [place]. Cierre la tapa de [place] que [place].  Colquese una toalla entre la piel y [place].  Coloque la bolsa sobre la zona durante 5 minutos y controle su piel. La piel puede estar de color rosa pero no debe estar roja.  Deje la bolsa en el rea durante 15 a 30 minutos.  Repita esto cada 2 a 4 horas mientras est despierto. Almohadilla trmica   Colquese una toalla entre la piel y la almohadilla trmica.  Ajuste la almohadilla en temperatura baja.  Colquela sobre la zona durante 10 minutos y controle su piel. La piel puede estar de color rosa pero no debe estar roja.  Deje la almohadilla trmica en el rea durante 20 a  40 minutos.  Repita esto cada 2 a 4 horas mientras est despierto.  No se acueste sobre la almohadilla trmica.  No se duerma mientras la est usando.  No debe usarla cuando se encuentre cerca del agua. El contacto con el agua puede provocar un shock elctrico. Romona CurlsSOLICITE ATENCIN MDICA SI:   Rigoberto Noelbserva ampollas, enrojecimiento, hinchazn o adormecimiento.  Tiene algn problema nuevo.  Los sntomas empeoran.  Tiene preguntas o preocupaciones. Si tiene algn problema, deje de usar la terapia de calor hasta que consulte al mdico.  ASEGRESE DE QUE:    Comprende estas instrucciones.  Controlar su enfermedad.  Solicitar ayuda de inmediato si no mejora o si empeora. Document Released: 07/03/2011 [facility] [employer] Patient Information 2015 [place]. This information is not intended to replace advice given to you by your health care provider. Make sure you discuss any questions you have with your health care provider.

## 2013-11-04 NOTE — ED Notes (Signed)
Pt states that on Thursday she was having right upper back pain near her shoulder.  Pt states that got better, but the pain has returned today as bilateral shoulder and upper back pain.  Pt adamantly denying any chest pain and states the pain is made worse with movement of her arms past a 30 degree rise.

## 2013-11-08 NOTE — ED Provider Notes (Signed)
Medical screening examination/treatment/procedure(s) were performed by non-physician practitioner and as supervising physician I was immediately available for consultation/collaboration.   EKG Interpretation None        Ranetta Armacost M Alden Bensinger, MD 11/08/13 0119 

## 2013-11-10 ENCOUNTER — Ambulatory Visit: Payer: Self-pay | Admitting: Internal Medicine

## 2014-01-06 ENCOUNTER — Encounter (HOSPITAL_COMMUNITY): Payer: Self-pay | Admitting: Emergency Medicine

## 2014-01-06 ENCOUNTER — Emergency Department (HOSPITAL_COMMUNITY)
Admission: EM | Admit: 2014-01-06 | Discharge: 2014-01-06 | Disposition: A | Discharge status: Home / Self Care | Payer: Self-pay | Attending: Emergency Medicine | Admitting: Emergency Medicine

## 2014-01-06 DIAGNOSIS — R51 Headache: Secondary | ICD-10-CM | POA: Insufficient documentation

## 2014-01-06 DIAGNOSIS — Z79899 Other long term (current) drug therapy: Secondary | ICD-10-CM | POA: Insufficient documentation

## 2014-01-06 DIAGNOSIS — R739 Hyperglycemia, unspecified: Secondary | ICD-10-CM

## 2014-01-06 DIAGNOSIS — Z3202 Encounter for pregnancy test, result negative: Secondary | ICD-10-CM | POA: Insufficient documentation

## 2014-01-06 DIAGNOSIS — J45909 Unspecified asthma, uncomplicated: Secondary | ICD-10-CM | POA: Insufficient documentation

## 2014-01-06 DIAGNOSIS — E119 Type 2 diabetes mellitus without complications: Secondary | ICD-10-CM | POA: Insufficient documentation

## 2014-01-06 LAB — COMPREHENSIVE METABOLIC PANEL
ALT: 12 U/L (ref 0–35)
AST: 14 U/L (ref 0–37)
Albumin: 3.4 g/dL — ABNORMAL LOW (ref 3.5–5.2)
Alkaline Phosphatase: 132 U/L — ABNORMAL HIGH (ref 39–117)
Anion gap: 16 — ABNORMAL HIGH (ref 5–15)
BUN: 13 mg/dL (ref 6–23)
CALCIUM: 9.1 mg/dL (ref 8.4–10.5)
CO2: 24 meq/L (ref 19–32)
Chloride: 96 mEq/L (ref 96–112)
Creatinine, Ser: 0.52 mg/dL (ref 0.50–1.10)
GFR calc Af Amer: >90 mL/min (ref >90)
GFR calc non Af Amer: >90 mL/min (ref >90)
Glucose, Bld: 403 mg/dL — ABNORMAL HIGH (ref 70–99)
Potassium: 3.9 mEq/L (ref 3.7–5.3)
SODIUM: 136 meq/L — AB (ref 137–147)
TOTAL PROTEIN: 7.5 g/dL (ref 6.0–8.3)
Total Bilirubin: <0.2 mg/dL — ABNORMAL LOW (ref 0.3–1.2)

## 2014-01-06 LAB — URINALYSIS, ROUTINE W REFLEX MICROSCOPIC
Bilirubin Urine: NEGATIVE
Glucose, UA: >1000 mg/dL — AB
HGB URINE DIPSTICK: NEGATIVE
Ketones, ur: NEGATIVE mg/dL
LEUKOCYTES UA: NEGATIVE
Nitrite: NEGATIVE
PROTEIN: NEGATIVE mg/dL
Specific Gravity, Urine: 1.042 — ABNORMAL HIGH (ref 1.005–1.030)
Urobilinogen, UA: 0.2 mg/dL (ref 0.0–1.0)
pH: 5 (ref 5.0–8.0)

## 2014-01-06 LAB — URINE MICROSCOPIC-ADD ON

## 2014-01-06 LAB — CBG MONITORING, ED
Glucose-Capillary: 380 mg/dL — ABNORMAL HIGH (ref 70–99)
Glucose-Capillary: 319 mg/dL — ABNORMAL HIGH (ref 70–99)

## 2014-01-06 LAB — CBC WITH DIFFERENTIAL/PLATELET
Basophils Absolute: 0 10*3/uL (ref 0.0–0.1)
Basophils Relative: 0 % (ref 0–1)
EOS ABS: 0.3 10*3/uL (ref 0.0–0.7)
EOS PCT: 2 % (ref 0–5)
HCT: 38.3 % (ref 36.0–46.0)
Hemoglobin: 12.6 g/dL (ref 12.0–15.0)
LYMPHS PCT: 18 % (ref 12–46)
Lymphs Abs: 2.9 10*3/uL (ref 0.7–4.0)
MCH: 25.3 pg — ABNORMAL LOW (ref 26.0–34.0)
MCHC: 32.9 g/dL (ref 30.0–36.0)
MCV: 76.8 fL — AB (ref 78.0–100.0)
Monocytes Absolute: 1 10*3/uL (ref 0.1–1.0)
Monocytes Relative: 6 % (ref 3–12)
Neutro Abs: 11.6 10*3/uL — ABNORMAL HIGH (ref 1.7–7.7)
Neutrophils Relative %: 74 % (ref 43–77)
PLATELETS: 273 10*3/uL (ref 150–400)
RBC: 4.99 MIL/uL (ref 3.87–5.11)
RDW: 14.8 % (ref 11.5–15.5)
WBC: 15.9 10*3/uL — AB (ref 4.0–10.5)

## 2014-01-06 LAB — POC URINE PREG, ED: Preg Test, Ur: NEGATIVE

## 2014-01-06 MED ORDER — METFORMIN HCL 500 MG PO TABS: 500.0000 mg | ORAL_TABLET | Freq: Two times a day (BID) | ORAL | Status: DC

## 2014-01-06 MED ORDER — SODIUM CHLORIDE 0.9 % IV BOLUS (SEPSIS)
1000.0000 mL | Freq: Once | INTRAVENOUS | Status: AC
  Administered 2014-01-06: 1000 mL via INTRAVENOUS

## 2014-01-06 MED ORDER — INSULIN ASPART 100 UNIT/ML ~~LOC~~ SOLN
10.0000 [IU] | Freq: Once | SUBCUTANEOUS | Status: AC
  Administered 2014-01-06: 10 [IU] via SUBCUTANEOUS
  Filled 2014-01-06: qty 1

## 2014-01-06 NOTE — ED Notes (Signed)
Gave pt a cup of ice and a cup of ice water.

## 2014-01-06 NOTE — ED Notes (Signed)
Pt presents to department for evaluation of headache and dizziness. Ongoing since Saturday. Pt also states frequent thirst and urination. Pt is alert and oriented x4. NAD.

## 2014-01-06 NOTE — ED Notes (Signed)
CBG 380 

## 2014-01-06 NOTE — ED Notes (Signed)
Pt here for eval of headache and dizziness. Wants to know if she is pregnant, headache since saturday

## 2014-01-06 NOTE — ED Provider Notes (Signed)
CSN: [id number]     Arrival date & time 01/06/14  1650 History   First MD Initiated Contact with Patient 01/06/14 2029     Chief Complaint  Patient presents with  . Dizziness  . Headache     (Consider location/radiation/quality/duration/timing/severity/associated sxs/prior Treatment) HPI Comments: 38 year old female with history of gestational diabetes in the past presents with approximately one week of gradual onset, persistent, gradually worsening dizziness with associated mild posterior headache, frequent polyuria and polydipsia and a small amount of weight loss. She denies fevers chills nausea vomiting or diarrhea, she is up 5 or 6 times per night to urinate.  Patient is a 38 y.o. female presenting with dizziness and headaches. The history is provided by the patient.  Dizziness Associated symptoms: headaches   Headache Associated symptoms: dizziness     Past Medical History  Diagnosis Date  . Asthma   . Gestational diabetes    Past Surgical History  Procedure Laterality Date  . Breast reduction surgery     No family history on file. History  Substance Use Topics  . Smoking status: Never Smoker   . Smokeless tobacco: Not on file  . Alcohol Use: No   OB History   Grav Para Term Preterm Abortions TAB SAB Ect Mult Living                 Review of Systems  Neurological: Positive for dizziness and headaches.  All other systems reviewed and are negative.     Allergies  Review of patient's allergies indicates no known allergies.  Home Medications   Prior to Admission medications   Medication Sig Start Date End Date Taking? Authorizing Provider  albuterol (PROVENTIL HFA;VENTOLIN HFA) 108 (90 BASE) MCG/ACT inhaler Inhale 2 puffs into the lungs every 6 (six) hours as needed for wheezing.   Yes Historical Provider, MD  albuterol (PROVENTIL) (5 MG/ML) 0.5% nebulizer solution Take 2.5 mg by nebulization every 6 (six) hours as needed for wheezing or shortness of breath.    Yes Historical Provider, MD  gabapentin (NEURONTIN) 300 MG capsule Take 300 mg by mouth daily.   Yes Historical Provider, MD  omeprazole (PRILOSEC) 40 MG capsule Take 40 mg by mouth daily.   Yes Historical Provider, MD  metFORMIN (GLUCOPHAGE) 500 MG tablet Take 1 tablet (500 mg total) by mouth 2 (two) times daily with a meal. 01/06/14   Vida Roller, MD   BP 102/84  Pulse 64  Temp(Src) 98 F (36.7 C) (Oral)  Resp 14  Ht  (1.626 m)  Wt 263 lb (119.296 kg)  BMI 45.12 kg/m2  SpO2 100% Physical Exam  Nursing note and vitals reviewed. Constitutional: She appears well-developed and well-nourished. No distress.  HENT:  Head: Normocephalic and atraumatic.  Mouth/Throat: No oropharyngeal exudate.  Dry mucous membranes  Eyes: Conjunctivae and EOM are normal. Pupils are equal, round, and reactive to light. Right eye exhibits no discharge. Left eye exhibits no discharge. No scleral icterus.  Neck: Normal range of motion. Neck supple. No JVD present. No thyromegaly present.  Cardiovascular: Normal rate, regular rhythm, normal heart sounds and intact distal pulses.  Exam reveals no gallop and no friction rub.   No murmur heard. Pulmonary/Chest: Effort normal and breath sounds normal. No respiratory distress. She has no wheezes. She has no rales.  Abdominal: Soft. Bowel sounds are normal. She exhibits no distension and no mass. There is no tenderness.  Musculoskeletal: Normal range of motion. She exhibits no edema and no tenderness.  Lymphadenopathy:    She has no cervical adenopathy.  Neurological: She is alert. Coordination normal.  Skin: Skin is warm and dry. No rash noted. No erythema.  Psychiatric: She has a normal mood and affect. Her behavior is normal.    ED Course  Procedures (including critical care time) Labs Review Labs Reviewed  URINALYSIS, ROUTINE W REFLEX MICROSCOPIC - Abnormal; Notable for the following:    APPearance CLOUDY (*)    Specific Gravity, Urine 1.042 (*)     Glucose, UA >1000 (*)    All other components within normal limits  CBC WITH DIFFERENTIAL - Abnormal; Notable for the following:    WBC 15.9 (*)    MCV 76.8 (*)    MCH 25.3 (*)    Neutro Abs 11.6 (*)    All other components within normal limits  COMPREHENSIVE METABOLIC PANEL - Abnormal; Notable for the following:    Sodium 136 (*)    Glucose, Bld 403 (*)    Albumin 3.4 (*)    Alkaline Phosphatase 132 (*)    Total Bilirubin <0.2 (*)    Anion gap 16 (*)    All other components within normal limits  URINE MICROSCOPIC-ADD ON - Abnormal; Notable for the following:    Squamous Epithelial / LPF FEW (*)    Bacteria, UA MANY (*)    All other components within normal limits  CBG MONITORING, ED - Abnormal; Notable for the following:    Glucose-Capillary 380 (*)    All other components within normal limits  CBG MONITORING, ED - Abnormal; Notable for the following:    Glucose-Capillary 319 (*)    All other components within normal limits  POC URINE PREG, ED    Imaging Review No results found.    MDM   Final diagnoses:  Hyperglycemia    The patient has new-onset diabetes, she is hyperglycemic but does not have a significant increased anion gap, no ketonuria and    normal bicarbonate. She will get IV fluids and subcutaneous insulin, laboratory workup shows that she should otherwise be able to tolerate outpatient oral therapy and followup with her family doctor. [facility] The patient has expressed her understanding. Her last meal was at 3:30 PM.  Recheck CBG close to 300, pt has had 2 L of fluids-  She is requesting d/c for family reasons - take care of small children but will f/u as outpt - not in DKA, seems stable to do this.  Pt expressed understanding to the indications for return.  Meds given in ED:  Medications  sodium chloride 0.9 % bolus 1,000 mL (0 mLs Intravenous Stopped 01/06/14 2221)  sodium chloride 0.9 % bolus 1,000 mL (0 mLs  Intravenous Stopped 01/06/14 2221)  insulin aspart (novoLOG) injection 10 Units (10 Units Subcutaneous Given 01/06/14 2118)    New Prescriptions   METFORMIN (GLUCOPHAGE) 500 MG TABLET    Take 1 tablet (500 mg total) by mouth 2 (two) times daily with a meal.      Vida Roller, MD 01/06/14 2225

## 2014-01-06 NOTE — Discharge Instructions (Signed)
Please call your doctor for a followup appointment within 24-48 hours. When you talk to your doctor please let them know that you were seen in the emergency department and have them acquire all of your records so that they can discuss the findings with you and formulate a treatment plan to fully care for your new and ongoing problems. ° °

## 2014-01-07 ENCOUNTER — Telehealth: Payer: Self-pay | Admitting: Internal Medicine

## 2014-01-07 NOTE — Telephone Encounter (Signed)
Pt stated newly Dx with DM. Need a glucometer to monitor her blood sugar till PCP visit.  Glucometer will e-scribe to our pharmacy

## 2014-01-07 NOTE — Telephone Encounter (Signed)
Patient has called in today saying that she is calling to follow up from the Urgent care with High Blood Sugar; Patient states that the treating physician gave her a new script and that she needs an additional script for a machine; please f/u with patient about her questions and concerns; patient was also given the number to the nurse line and instructed to call after 2:00pm [phone]

## 2014-01-12 ENCOUNTER — Ambulatory Visit: Payer: Self-pay

## 2014-02-23 ENCOUNTER — Ambulatory Visit: Payer: Self-pay | Attending: Internal Medicine

## 2014-03-30 ENCOUNTER — Ambulatory Visit: Payer: Self-pay | Attending: Internal Medicine | Admitting: Internal Medicine

## 2014-03-30 DIAGNOSIS — R1011 Right upper quadrant pain: Secondary | ICD-10-CM | POA: Insufficient documentation

## 2014-03-30 DIAGNOSIS — M797 Fibromyalgia: Secondary | ICD-10-CM | POA: Insufficient documentation

## 2014-03-30 DIAGNOSIS — R1012 Left upper quadrant pain: Secondary | ICD-10-CM

## 2014-03-30 DIAGNOSIS — K219 Gastro-esophageal reflux disease without esophagitis: Secondary | ICD-10-CM | POA: Insufficient documentation

## 2014-03-30 DIAGNOSIS — E119 Type 2 diabetes mellitus without complications: Secondary | ICD-10-CM | POA: Insufficient documentation

## 2014-03-30 LAB — COMPLETE METABOLIC PANEL WITH GFR
ALT: 9 U/L (ref 0–35)
AST: 10 U/L (ref 0–37)
Albumin: 3.9 g/dL (ref 3.5–5.2)
Alkaline Phosphatase: 105 U/L (ref 39–117)
BILIRUBIN TOTAL: 0.2 mg/dL (ref 0.2–1.2)
BUN: 13 mg/dL (ref 6–23)
CALCIUM: 9.2 mg/dL (ref 8.4–10.5)
CO2: 27 meq/L (ref 19–32)
CREATININE: 0.68 mg/dL (ref 0.50–1.10)
Chloride: 100 mEq/L (ref 96–112)
GFR, Est African American: >89 mL/min
Glucose, Bld: 202 mg/dL — ABNORMAL HIGH (ref 70–99)
Potassium: 4.3 mEq/L (ref 3.5–5.3)
Sodium: 137 mEq/L (ref 135–145)
Total Protein: 7.3 g/dL (ref 6.0–8.3)

## 2014-03-30 LAB — LIPID PANEL
Cholesterol: 148 mg/dL (ref 0–200)
HDL: 38 mg/dL — AB (ref >39)
LDL Cholesterol: 90 mg/dL (ref 0–99)
TRIGLYCERIDES: 101 mg/dL (ref <150)
Total CHOL/HDL Ratio: 3.9 Ratio
VLDL: 20 mg/dL (ref 0–40)

## 2014-03-30 LAB — POCT URINALYSIS DIPSTICK
Bilirubin, UA: NEGATIVE
Glucose, UA: NEGATIVE
KETONES UA: NEGATIVE
Nitrite, UA: NEGATIVE
PH UA: 7
PROTEIN UA: 30
SPEC GRAV UA: 1.02
Urobilinogen, UA: 1

## 2014-03-30 LAB — GLUCOSE, POCT (MANUAL RESULT ENTRY): POC Glucose: 198 mg/dl — AB (ref 70–99)

## 2014-03-30 LAB — POCT GLYCOSYLATED HEMOGLOBIN (HGB A1C): Hemoglobin A1C: 7.8

## 2014-03-30 MED ORDER — OMEPRAZOLE 40 MG PO CPDR: 40.0000 mg | DELAYED_RELEASE_CAPSULE | Freq: Every day | ORAL | Status: DC

## 2014-03-30 MED ORDER — METFORMIN HCL 500 MG PO TABS: 500.0000 mg | ORAL_TABLET | Freq: Two times a day (BID) | ORAL | Status: DC

## 2014-03-30 MED ORDER — GABAPENTIN 300 MG PO CAPS: 300.0000 mg | ORAL_CAPSULE | Freq: Every day | ORAL | Status: DC

## 2014-03-30 MED ORDER — GLUCOSE BLOOD VI STRP: ORAL_STRIP | Status: DC

## 2014-03-30 NOTE — Progress Notes (Signed)
Pt here for f/u left lower abdominal pain radiating to back x 2-3 mnths intermit Denies vomiting with slight nausea LMP- current CBg,A1c obtained with urine dipstick Spanish interpretor present   Lisa Moreno, is a 38 y.o. female  UJW:[id number]  [id number]  DOB - [birth year]  Chief Complaint  Patient presents with  . Follow-up  . Back Pain  . Diabetes        Subjective:   Lisa Moreno is a 38 y.o. female here today for a follow up visit. Patient with history of gestational diabetes that progressed to type 2 diabetes on metformin, morbid obesity, fibromyalgia and chronic abdominal pain. Pt here had today for follow-up of diabetes mellitus, major complaint includes left lower abdominal pain radiating to back x 2-3 mnths, intermittent, should her tract infection has been ruled out. Denies vomiting but has slight nausea. Occasional diarrhea alternating with constipation. Patient was treated severally early in the year for diarrhea disease associated with elevated white cell count, she was positive for C. difficile by PCR. Diarrhea resolved at that time. But abdominal pain has persisted. She denies any fever, chills, chest pain, shortness of breath, cough, melena, hematochezia, no urinary symptoms. LMP- current. Patient has No headache, No chest pain, No new weakness tingling or numbness.  No problems updated.  ALLERGIES: No Known Allergies  PAST MEDICAL HISTORY: Past Medical History  Diagnosis Date  . Asthma   . Gestational diabetes   . Hypertension     MEDICATIONS AT HOME: Prior to Admission medications   Medication Sig Start Date End Date Taking? Authorizing Provider  albuterol (PROVENTIL HFA;VENTOLIN HFA) 108 (90 BASE) MCG/ACT inhaler Inhale 2 puffs into the lungs every 6 (six) hours as needed for wheezing.   Yes Historical Provider, MD  gabapentin (NEURONTIN) 300 MG capsule Take 1 capsule (300 mg total) by mouth daily. 03/30/14  Yes Quentin Angstlugbemiga E  Jegede, MD  metFORMIN (GLUCOPHAGE) 500 MG tablet Take 1 tablet (500 mg total) by mouth 2 (two) times daily with a meal. 03/30/14  Yes Olugbemiga E Hyman HopesJegede, MD  omeprazole (PRILOSEC) 40 MG capsule Take 1 capsule (40 mg total) by mouth daily. 03/30/14  Yes Quentin Angstlugbemiga E Jegede, MD  albuterol (PROVENTIL) (5 MG/ML) 0.5% nebulizer solution Take 2.5 mg by nebulization every 6 (six) hours as needed for wheezing or shortness of breath.    Historical Provider, MD  amoxicillin (AMOXIL) 500 MG tablet Take 2 tablets (1,000 mg total) by mouth 2 (two) times daily. 07/13/14   Mercedes Camprubi-Soms, PA-C  glucose blood (TRUETEST TEST) test strip Use as instructed 03/30/14   Quentin Angstlugbemiga E Jegede, MD     Objective:   There were no vitals filed for this visit.  Exam General appearance : Awake, alert, not in any distress. Speech Clear. Not toxic looking, morbidly obese HEENT: Atraumatic and Normocephalic, pupils equally reactive to light and accomodation Neck: supple, no JVD. No cervical lymphadenopathy.  Chest:Good air entry bilaterally, no added sounds  CVS: S1 S2 regular, no murmurs.  Abdomen: Bowel sounds present, Non tender and not distended with no gaurding, rigidity or rebound. Extremities: B/L Lower Ext shows no edema, both legs are warm to touch Neurology: Awake alert, and oriented X 3, CN II-XII intact, Non focal Skin:No Rash  Data Review Lab Results  Component Value Date   HGBA1C 7.8 03/30/2014   HGBA1C 6.7 05/22/2013     Assessment & Plan   1. Type 2 diabetes mellitus without complication  - Glucose (CBG) -  HgB A1c - Urinalysis Dipstick - metFORMIN (GLUCOPHAGE) 500 MG tablet; Take 1 tablet (500 mg total) by mouth 2 (two) times daily with a meal.  Dispense: 180 tablet; Refill: 3 - COMPLETE METABOLIC PANEL WITH GFR - Lipid panel - TSH - glucose blood (TRUETEST TEST) test strip; Use as instructed  Dispense: 100 each; Refill: 12   Aim for 2-3 Carb Choices per meal (30-45 grams) +/- 1  either way  Aim for 0-15 Carbs per snack if hungry  Include protein in moderation with your meals and snacks  Consider reading food labels for Total Carbohydrate and Fat Grams of foods  Consider checking BG at alternate times per day  Continue taking medication as directed Fruit Punch - find one with no sugar  Measure and decrease portions of carbohydrate foods  Make your plate and don't go back for seconds   2. Fibromyalgia  - gabapentin (NEURONTIN) 300 MG capsule; Take 1 capsule (300 mg total) by mouth daily.  Dispense: 90 capsule; Refill: 3  3. Morbid obesity  Patient was counseled extensively about nutrition and exercise.  4. Gastroesophageal reflux disease without esophagitis  - omeprazole (PRILOSEC) 40 MG capsule; Take 1 capsule (40 mg total) by mouth daily.  Dispense: 90 capsule; Refill: 3  5. Left upper quadrant pain  - Ambulatory referral to Gastroenterology - [facility] COLONOSCOPY  Interpreter was used to communicate directly with patient for the entire encounter including providing detailed patient instructions.   Patient have been counseled extensively about nutrition and exercise  Return in about 6 months (around 09/29/2014) for Abdominal Pain, Hemoglobin A1C and Follow up, DM.  The patient was given clear instructions to go to ER or return to medical center if symptoms don't improve, worsen or new problems develop. The patient verbalized understanding. The patient was told to call to get lab results if they haven't heard anything in the next week.   This note has been created with Education officer, environmentalDragon speech recognition software and smart phrase technology. Any transcriptional errors are unintentional.    Jeanann LewandowskyJEGEDE, OLUGBEMIGA, MD, MHA, CPE, FACP, FAAP [facility], [place] [phone]

## 2014-03-30 NOTE — Patient Instructions (Signed)
Diabetes y Kandace Blitz fsica (Diabetes and Exercise) Hacer actividad fsica con regularidad es muy importante. No se trata solo de [employer]. Tiene muchos otros beneficios, como por ejemplo:  Mejorar el estado fsico, la flexibilidad y la resistencia.  Aumenta la densidad sea.  Ayuda a Technical sales engineer.  Disminuye la Air traffic controller.  Aumenta la fuerza muscular.  Reduce el estrs y las tensiones.  Mejora el estado de salud general. Las personas diabticas que realizan actividad fsica tienen beneficios adicionales debido al ejercicio:  Reduce el apetito.  El organismo mejora el uso del azcar (glucosa) de la [place].  Ayuda a disminuir o Product/process development scientist.  Disminuye la presin arterial.  Ayuda a disminuir los lpidos en la sangre (colesterol y triglicridos).  El organismo mejora el uso de la insulina porque:  Aumenta la sensibilidad del organismo a la insulina.  Reduce las necesidades de insulina del organismo.  Disminuye el riesgo de enfermedad cardaca por la actividad fsica ya que  disminuye el colesterol y [employer] triglicridos.  Aumenta los niveles de colesterol bueno (como las lipoprotenas de alta densidad [HDL]) en el organismo.  Disminuye los niveles de glucosa en la [place]. SU PLAN DE ACTIVIDAD  Elija una actividad que disfrute y establezca objetivos realistas. Su mdico o educador en diabetes podrn ayudarlo a encontrar una actividad que lo beneficie. Haga ejercicio regularmente como se lo haya indicado el mdico. Esto incluye:  Hacer entrenamiento de [employer] a la semana, como flexiones, sentadillas, levantar peso o usar bandas de resistencia.  Practicar 158minutos de ejercicios cardiovasculares cada semana, como caminar, correr o hacer algn deporte.  Mantenerse activo y no permanecer inactivo durante ms de 19minutos seguidos. Los perodos cortos de [place] tambin son beneficiosos. Tres sesiones de 19minutos a  lo largo del da son tan beneficiosas como una sola sesin de 48minutos. Estas son algunas ideas para los ejercicios:  Lleve a Probation officer.  Utilice las Clinical cytogeneticist del ascensor.  Baile su cancin favorita.  Haga los ejercicios de un video de ejercicios.  Haga sus ejercicios favoritos con Gaffer. RECOMENDACIONES PARA REALIZAR EJERCICIOS CUANDO SE TIENE DIABETES TIPO 1 O TIPO 2   Controle la glucosa en la sangre antes de comenzar. Si el nivel de glucosa en la sangre es de ms de 240 mg/dl, controle las cetonas en la [place]. No haga actividad fsica si hay cetonas.  Evite inyectarse insulina en las zonas del cuerpo que ejercitar. Por ejemplo, evite inyectarse insulina en:  Los brazos, si juega al tenis.  Las piernas, si corre.  Lleve un registro de:  Los alimentos que consume antes y despus de TEFL teacher.  Los momentos esperables de picos de accin de la insulina.  Los niveles de glucosa en la sangre antes y despus de hacer ejercicios.  El tipo y cantidad de [place] fsica que Musician.  Revise los registros con su mdico. El mdico lo ayudar a Actor pautas para ajustar la cantidad de alimento y las cantidades de insulina antes y despus de Field seismologist ejercicios.  Si toma insulina o agentes hipoglucemiantes por va oral, observe si hay signos y sntomas de hipoglucemia. Entre los que se incluyen:  Mareos.  Temblores.  Sudoracin.  Escalofros.  Confusin.  Beba gran cantidad de agua mientras hace ejercicios para evitar la deshidratacin o los golpes de Freight forwarder. Durante la actividad fsica se pierde agua corporal que se debe reponer.  Comente con su mdico antes de comenzar un programa de  actividad fsica para verificar que sea seguro para usted. Recuerde, cualquier actividad es mejor que ninguna. Document Released: 04/30/2007 Document Revised: 08/25/2013 [facility] Patient Information 2015 [place], [place]. This information is not intended to  replace advice given to you by your health care provider. Make sure you discuss any questions you have with your health care provider. Recuento bsico de carbohidratos para la diabetes mellitus (Basic Carbohydrate Counting for Diabetes Mellitus) El recuento de carbohidratos es un mtodo destinado a calcular la cantidad de carbohidratos en la dieta. El consumo de carbohidratos aumenta naturalmente el nivel de azcar (glucosa) en la sangre, por lo que es importante que sepa la cantidad que debe incluir en cada comida. El recuento de carbohidratos ayuda a Advertising account executive de glucosa en la sangre dentro de los lmites normales. La cantidad permitida de carbohidratos es diferente para cada persona. Un nutricionista puede ayudarlo a calcular la cantidad adecuada para usted. Una vez que sepa la cantidad de carbohidratos que puede consumir, podr calcular los carbohidratos de los alimentos que desea comer. Los siguientes alimentos incluyen carbohidratos:  Granos, como panes y cereales.  Frijoles secos y productos con soja.  Vegetales almidonados, como papas, guisantes y maz.  Lambert Mody y jugos de frutas.  Leche y Estate agent.  Dulces y bocadillos, como pastel, galletas, caramelos, papas fritas de bolsa, refrescos y bebidas frutales con azcar. RECUENTO DE CARBOHIDRATOS [employer] de calcular los carbohidratos de los alimentos. Puede usar cualquiera de los dos mtodos o [place] combinacin de [place]. Leer la etiqueta de informacin nutricional de los alimentos envasados La informacin nutricional es una etiqueta incluida en casi todas las bebidas y los alimentos envasados de los [place] el tamao de la porcin de ese alimento o bebida e informacin sobre los nutrientes de cada porcin, incluso los gramos (g) de carbohidratos por porcin.  Decida la cantidad de porciones que comer o tomar de este alimento o bebida. Multiplique la cantidad de porciones por el nmero de gramos de carbohidratos  indicados en la etiqueta para esa porcin. El total ser la cantidad de carbohidratos que consumir al comer ese alimento o tomar esa bebida. Conocer las porciones estndar de los alimentos Cuando coma alimentos no envasados o que no incluyan la informacin nutricional en la etiqueta, deber medir las porciones para poder calcular la cantidad de carbohidratos. Una porcin de la mayora de los alimentos ricos en carbohidratos contiene alrededor de 15g de carbohidratos. La siguiente [employer] tamaos de porcin de los alimentos ricos en carbohidratos que contienen alrededor de 15g de carbohidratos por porcin:   1rebanada de pan (1oz) o 1tortilla de seis pulgadas.  panecillo de hamburguesa o bollito tipo ingls.  4a 6galletas.   de taza de cereal sin azcar y seco.   taza de cereal caliente.   de taza de arroz o pastas.  taza de pur de papas o de una papa grande al horno.  1taza de frutas frescas o una fruta pequea.  taza de frutas o jugo de frutas enlatados o congelados.  [place].   de taza de yogur descremado sin ningn agregado o de yogur endulzado con edulcorante artificial.  taza de vegetales almidonados, como guisantes, maz o papas, o de frijoles secos cocidos. Decida la cantidad de porciones Gaffer. Multiplique la cantidad de porciones por 15 (los gramos de carbohidratos en esa porcin). Por ejemplo, si come 2tazas de fresas, habr comido 2porciones y 30g de carbohidratos (2porciones x 15g = 30g). [place]  como sopas y guisos, en las que se mezcla ms de un alimento, deber [place] Fecontar los carbohidratos de cada alimento incluido. EJEMPLO DE RECUENTO DE CARBOHIDRATOS Ejemplo de cena  3 onzas de pechugas de pollo.   de taza de arroz integral.   taza de maz.  1 taza de [place].  1 taza de fresas con crema batida sin azcar. Clculo de carbohidratos [place] 1: Identifique los alimentos que contienen carbohidratos:     Arroz.  Maz.  Leche.  Jinny SandersFresas. [place] 2: Calcule el nmero de porciones que consumir de cada uno:   2 porciones de Surveyor, mineralsarroz.  1 porcin de maz.  1 porcin de leche.  1 porcin de fresas. [place] 3: Multiplique cada una de esas porciones por 15g:   2 porciones de arroz x 15 g = 30 g.  1 porcin de maz x 15 g = 15 g.  1 porcin de leche x 15 g = 15 g.  1 porcin de fresas x 15 g = 15 g. [place] 4: Sume todas las cantidades para Artistconocer el total de gramos de carbohidratos consumidos: 30 g + 15 g + 15 g + 15 g = 75 g. Document Released: 07/03/2011 Document Revised: 08/25/2013 [facility] Patient Information 2015 [place], [place]. This information is not intended to replace advice given to you by your health care provider. Make sure you discuss any questions you have with your health care provider. Abdominal Pain Many things can cause abdominal pain. Usually, abdominal pain is not caused by a disease and will improve without treatment. It can often be observed and treated at home. Your health care provider will do a physical exam and possibly order blood tests and X-rays to help determine the seriousness of your pain. However, in many cases, more time must pass before a clear cause of the pain can be found. Before that point, your health care provider may not know if you need more testing or further treatment. HOME CARE INSTRUCTIONS  Monitor your abdominal pain for any changes. The following actions may help to alleviate any discomfort you are experiencing:  Only take over-the-counter or prescription medicines as directed by your health care provider.  Do not take laxatives unless directed to do so by your health care provider.  Try a clear liquid diet (broth, tea, or water) as directed by your health care provider. Slowly move to a bland diet as tolerated. SEEK MEDICAL CARE IF:  You have unexplained abdominal pain.  You have abdominal pain associated with nausea or diarrhea.  You  have pain when you urinate or have a bowel movement.  You experience abdominal pain that wakes you in the night.  You have abdominal pain that is worsened or improved by eating food.  You have abdominal pain that is worsened with eating fatty foods.  You have a fever. SEEK IMMEDIATE MEDICAL CARE IF:   Your pain does not go away within 2 hours.  You keep throwing up (vomiting).  Your pain is felt only in portions of the abdomen, such as the right side or the left lower portion of the abdomen.  You pass bloody or black tarry stools. MAKE SURE YOU:  Understand these instructions.   Will watch your condition.   Will get help right away if you are not doing well or get worse.  Document Released: 01/18/2005 Document Revised: 04/15/2013 Document Reviewed: 12/18/2012 [facility] Patient Information 2015 [place], [place]. This information is not intended to replace advice given to you by your health care provider.  Make sure you discuss any questions you have with your health care provider.  

## 2014-03-31 LAB — TSH: TSH: 1.483 u[IU]/mL (ref 0.350–4.500)

## 2014-04-07 ENCOUNTER — Telehealth: Payer: Self-pay | Admitting: Internal Medicine

## 2014-04-07 NOTE — Telephone Encounter (Signed)
Patient has called in today for the results of her last lab appointment; please f/u with patient

## 2014-07-13 ENCOUNTER — Encounter (HOSPITAL_COMMUNITY): Payer: Self-pay | Admitting: Emergency Medicine

## 2014-07-13 ENCOUNTER — Emergency Department (HOSPITAL_COMMUNITY)
Admission: EM | Admit: 2014-07-13 | Discharge: 2014-07-13 | Disposition: A | Discharge status: Home / Self Care | Payer: Self-pay | Attending: Emergency Medicine | Admitting: Emergency Medicine

## 2014-07-13 DIAGNOSIS — J45909 Unspecified asthma, uncomplicated: Secondary | ICD-10-CM | POA: Insufficient documentation

## 2014-07-13 DIAGNOSIS — Z8632 Personal history of gestational diabetes: Secondary | ICD-10-CM | POA: Insufficient documentation

## 2014-07-13 DIAGNOSIS — I10 Essential (primary) hypertension: Secondary | ICD-10-CM | POA: Insufficient documentation

## 2014-07-13 DIAGNOSIS — Z79899 Other long term (current) drug therapy: Secondary | ICD-10-CM | POA: Insufficient documentation

## 2014-07-13 DIAGNOSIS — R059 Cough, unspecified: Secondary | ICD-10-CM

## 2014-07-13 DIAGNOSIS — J302 Other seasonal allergic rhinitis: Secondary | ICD-10-CM

## 2014-07-13 DIAGNOSIS — R05 Cough: Secondary | ICD-10-CM

## 2014-07-13 DIAGNOSIS — R0981 Nasal congestion: Secondary | ICD-10-CM

## 2014-07-13 HISTORY — DX: Essential (primary) hypertension: I10

## 2014-07-13 MED ORDER — AMOXICILLIN 500 MG PO TABS: 1000.0000 mg | ORAL_TABLET | Freq: Two times a day (BID) | ORAL | Status: DC

## 2014-07-13 NOTE — ED Notes (Signed)
Pt states she has had a cough x 1 1/2 weeks. Yellow green phlegm. Alert and oriented.

## 2014-07-13 NOTE — Discharge Instructions (Signed)
Continue to stay well-hydrated. Gargle warm salt water and spit it out. Continue to alternate between Tylenol and Ibuprofen for pain or fever. Take antibiotic as directed. Use Mucinex for cough suppression/expectoration of mucus. Use netipot and flonase to help with nasal congestion. May consider over-the-counter Benadryl or other antihistamine to decrease secretions and for watery itchy eyes. Continue your home inhalers as directed for cough/wheezing. Followup with your primary care doctor in 5-7 days for recheck of ongoing symptoms. Return to emergency department for emergent changing or worsening of symptoms.   Tos - Adultos  (Cough, Adult)  La tos es un reflejo que ayuda a limpiar las vas areas y Administrator. Puede ayudar a curar el organismo o ser Neomia Dear reaccin a un irritante. La tos puede durar [employer] 2  3 semanas (aguda) o puede durar ms de 8 semanas (crnica)  CAUSAS  Tos aguda:   Infecciones virales o bacterianas. Tos crnica.   Infecciones.  Alergias.  Asma.  Goteo post nasal.  El hbito de fumar.  Acidez o reflujo gstrico.  Algunos medicamentos.  Problemas pulmonares crnicos  Cncer. SNTOMAS   Tos.  Grant Ruts.  Dolor en el pecho.  Aumento en el ritmo respiratorio.  Ruidos agudos al respirar (sibilancias).  Moco coloreado al toser (esputo). TRATAMIENTO   Un tos de causa bacteriana puede tratarse con antibiticos.  La tos de origen viral debe seguir su curso y no responde a los antibiticos.  El mdico podr recomendar otros tratamientos si tiene tos crnica. INSTRUCCIONES PARA EL CUIDADO EN EL HOGAR   Solo tome medicamentos que se pueden comprar sin receta o recetados para Chief Technology Officer, Dentist o fiebre, como le indica el mdico. Utilice antitusivos slo en la forma indicada por el mdico.  Use un vaporizador o humidificador de niebla fra en la habitacin para ayudar a aflojar las secreciones.  Duerma en posicin semi erguida si la tos empeora por la  noche.  Descanse todo lo que pueda.  Si fuma, abandone el hbito. SOLICITE ATENCIN MDICA DE INMEDIATO SI:   Observa pus en el esputo.  La tos empeora.  No puede controlar la tos con antitusivos y no puede dormir debido a Secretary/administrator.  Comienza a escupir sangre al toser.  Tiene dificultad para respirar.  El dolor empeora o no puede controlarlo con los medicamentos.  Tiene fiebre. ASEGRESE DE QUE:   Comprende estas instrucciones.  Controlar su enfermedad.  Solicitar ayuda de inmediato si no mejora o si empeora. Document Released: 11/16/2010 Document Revised: 07/03/2011 [facility] Patient Information 2015 [place], [place]. This information is not intended to replace advice given to you by your health care provider. Make sure you discuss any questions you have with your health care provider.  Fiebre del heno  (Hay Fever)  La fiebre del heno es un tipo de Programmer, multimedia a elementos como el csped, los animales,el polen de las plantas y las flores. No puede transmitirse de persona a Social worker. La fiebre del heno no se cura, pero pueden [employer]. CUIDADOS EN EL HOGAR   Evite las cosas que puedan causar el problema.  Tome los [employer] le indic el mdico. SOLICITE AYUDA DE INMEDIATO SI:   Tiene asma, tose y comienza a tener silbidos al respirar (sibilancias).  Tiene la boca o los labios hinchados.  Tiene dificultad para respirar.  Se siente dbil, sufre mareos o se desvanece .(se desmaya).  Tiene fiebre.  Los problemas empeoran y los medicamentos no lo [place].  El tratamiento lo [place], [employer]  han vuelto.  Est congestionado y siente presin Arts development officer.  Sufre una cefalea grave.  Tiene transpiracin fra. ASEGRESE DE QUE:   Comprende estas instrucciones.  Controlar su enfermedad.  Solicitar ayuda de inmediato si no mejora o si empeora. Document Released: 09/12/2010 Document Revised: 07/03/2011 [facility] Patient Information  2015 [place], [place]. This information is not intended to replace advice given to you by your health care provider. Make sure you discuss any questions you have with your health care provider.  Sinusitis (Sinusitis) La sinusitis es el enrojecimiento, el dolor y la inflamacin de los senos paranasales. Los senos paranasales son bolsas de aire que se encuentran dentro de los huesos de la cara (por debajo de los ojos, en la mitad de la frente o por encima de los ojos). En los senos paranasales sanos, el moco puede drenar y el aire circula a travs de ellos en su camino hacia la Clinical cytogeneticist. Sin embargo, cuando se [place], el moco y el aire [place]. Esto hace que se desarrollen bacterias y otros grmenes que causan infeccin. La sinusitis puede desarrollarse rpidamente y durar solo un tiempo corto (aguda) o continuar por un perodo largo (crnica). La sinusitis que dura ms de 12 semanas se considera crnica.  CAUSAS  Las causas de la sinusitis son:  Cualquier alergia que tenga.  Las [employer], como el desplazamiento del cartlago que separa las fosas nasales (desvo del tabique), que pueden disminuir el flujo de aire por la nariz y los senos paranasales, y Audiological scientist su drenaje.  Las anomalas funcionales, como cuando los pequeos pelos (cilias) que se encuentran en los senos paranasales y que ayudan a eliminar el moco no funcionan correctamente o no estn presentes. SIGNOS Y SNTOMAS  Los sntomas de la sinusitis aguda y [place] son los mismos. Los sntomas principales son Chief Technology Officer y la presin alrededor de los senos paranasales afectados. Otros sntomas son:  Careers information officer.  Dolor de odos.  Dolor de [place].  Mal aliento.  Disminucin del sentido del olfato y del gusto.  Tos, que empeora al [employer]  Fatiga.  Grant Ruts.  Drenaje de moco espeso por la nariz, que generalmente es de color verde y puede contener pus (purulento).  Hinchazn y calor en los  senos paranasales afectados. DIAGNSTICO  Su mdico le Medical sales representative examen fsico. Durante el examen, el mdico:  Le revisar la nariz para buscar signos de crecimientos anormales en las fosas nasales (plipos nasales).  Palpar los senos paranasales afectados para buscar signos de infeccin.  Ver el interior de los senos paranasales (endoscopia) a travs de un dispositivo de obtencin de imgenes que tiene una luz conectada (endoscopio). Si el mdico sospecha que usted sufre sinusitis crnica, podr indicar una o ms de las siguientes pruebas:  Pruebas de Programmer, multimedia.  Cultivo de las Yahoo. Se extrae Lauris Poag de moco de la nariz, que se enva al laboratorio para detectar bacterias.  Citologa nasal. Se extrae Lauris Poag de moco de la nariz, que el mdico examina para determinar si la sinusitis est relacionada con Vella Raring. TRATAMIENTO  La mayora de los casos de sinusitis aguda se deben a una infeccin viral y se resuelven espontneamente en un perodo de 10das. En algunos casos, se recetan medicamentos para [employer] (analgsicos, descongestivos, aerosoles nasales con corticoides o aerosoles salinos).  Sin embargo, para la sinusitis por infeccin bacteriana, Chief Operating Officer. Los antibiticos son medicamentos que destruyen las bacterias que causan la infeccin.  Con  poca frecuencia, la sinusitis tiene su origen en una infeccin por hongos. En estos casos, el mdico recetar un medicamento antimictico. Para algunos casos de sinusitis crnica, es necesario someterse a [place]una ciruga. Generalmente, se trata de Engelhard Corporationcasos en los que la sinusitis se repite ms de 3veces al ao, a pesar de otros tratamientos. INSTRUCCIONES PARA EL CUIDADO EN EL HOGAR   Beber gran cantidad de lquidos. Los lquidos ayudan a Dillard'sdisolver el moco, para que drene ms fcilmente de los senos paranasales.  Use un humidificador.  Inhale vapor de 3 a 4 veces al da (por  ejemplo, sintese en el bao con la ducha abierta).  Aplquese un pao tibio y hmedo en la cara 3 o 4 veces al da o segn las indicaciones del mdico.  Use un aerosol nasal salino para ayudar a Environmental education officerhumedecer y Duke Energylimpiar los senos nasales.  Tome los medicamentos solamente como se lo haya indicado el mdico.  Si le recetaron un antimictico o un antibitico, asegrese de terminarlos, incluso si comienza a sentirse mejor. SOLICITE ATENCIN MDICA DE INMEDIATO SI:  Siente ms dolor o sufre dolores de cabeza intensos.  Tiene nuseas, vmitos o somnolencia.  Observa hinchazn alrededor del rostro.  Tiene problemas de visin.  Presenta rigidez en el cuello.  Tiene dificultad para respirar. ASEGRESE DE QUE:   Comprende estas instrucciones.  Controlar su afeccin.  Recibir ayuda de inmediato si no mejora o si empeora. Document Released: 01/18/2005 Document Revised: 08/25/2013 [facility] Patient Information 2015 [place]. This information is not intended to replace advice given to you by your health care provider. Make sure you discuss any questions you have with your health care provider.  Infeccin de las vas areas superiores en los adultos (Upper Respiratory Infection, Adult)  La infeccin de las vas areas superiores tambin se conoce como resfro comn. La causa es un tipo de germen (virus). Los resfros se diseminan fcilmente (son contagiosos). Puede transmitirlo a los dems al besar, toser, estornudar o beber en el mismo vaso. Generalmente se cura en 1 a 2 semanas.  CUIDADOS EN EL HOGAR   Tome la medicacin segn las indicaciones.  Use un humidificador caliente o respire el vapor en una ducha caliente.  Beba gran cantidad de lquido para mantener la orina de tono claro o color amarillo plido.  Debe hacer reposo.  Regrese a su trabajo cuando la temperatura se haya normalizado, o cuando el mdico se lo indique. Puede usar un barbijo y Customer service managerlavarse las manos para evitar que  el resfro se contagie. SOLICITE AYUDA DE INMEDIATO SI:   Luego de los primeros das siente que empeora en vez de [place] Gardensmejorar.  Tiene dudas relacionadas con los medicamentos.  Siente escalofros, le falta el aire o escupe moco de color marrn o rojo.  Tiene una secrecin nasal de color amarillo o marrn, o siente dolor en el rostro, especialmente cuando se inclina hacia adelante.  Tiene fiebre, siente el cuello hinchado, tiene dolor al tragar u observa manchas blancas en el fondo de la garganta.  Comienza a sentir Herbalistun dolor de cabeza intenso o persistente, dolor de odos, en el seno nasal o en el pecho.  Al respirar emite un sonido similar a un silbido (sibilancias).  Siente falta de aire o escupe sangre al toser.  Tiene dolores musculares o rigidez en el cuello. ASEGRESE DE QUE:   Comprende estas instrucciones.  Controlar su enfermedad.  Solicitar ayuda de inmediato si no mejora o si empeora. Document Released: 09/12/2010 Document Revised: 07/03/2011 ExitCare Patient Information  2015 ExitCare, LLC. This information is not intended to replace advice given to you by your health care provider. Make sure you discuss any questions you have with your health care provider. ° °

## 2014-07-13 NOTE — ED Provider Notes (Signed)
CSN: [id number]     Arrival date & time 07/13/14  1715 History  This chart was scribed for non-physician practitioner working with Tilden Fossa, MD, by Modena Jansky, ED Scribe. This patient was seen in room WTR8/WTR8 and the patient's care was started at 7:32 PM.   Chief Complaint  Patient presents with  . Cough   Patient is a 39 y.o. female presenting with cough and abdominal pain. The history is provided by the patient. No language interpreter was used.  Cough Cough characteristics:  Dry Severity:  Moderate Onset quality:  Gradual Duration:  3 weeks Timing:  Intermittent Progression:  Unchanged Chronicity:  New Smoker: no   Context: weather changes   Context: not sick contacts   Relieved by:  None tried Worsened by:  Nothing tried Ineffective treatments:  Decongestant and cough suppressants (robitussin and theraflu) Associated symptoms: eye discharge (clear), rhinorrhea (yellow) and sinus congestion   Associated symptoms: no chest pain, no chills, no ear fullness, no ear pain, no fever, no headaches, no myalgias, no rash, no shortness of breath, no sore throat and no wheezing   Abdominal Pain Pain location:  Epigastric Pain quality comment:  Tight Pain radiates to:  Does not radiate Pain severity:  Moderate Onset quality:  Gradual Duration:  3 weeks Timing:  Intermittent Progression:  Unchanged Chronicity:  New Context comment:  Coughing Relieved by:  None tried Worsened by:  Coughing Ineffective treatments:  None tried Associated symptoms: cough   Associated symptoms: no belching, no chest pain, no chills, no constipation, no diarrhea, no dysuria, no fever, no hematemesis, no hematochezia, no hematuria, no melena, no nausea, no shortness of breath, no sore throat, no vaginal bleeding, no vaginal discharge and no vomiting     HPI Comments: Lisa Moreno is a 39 y.o. female with a PMHx of asthma and seasonal allergies, who presents to the Emergency Department  complaining of moderate intermittent cough that started 3 weeks ago. She states that she has been sick the past 3 weeks. She reports that the cough is worse at night. She describes the cough as dry. She states that she has associated yellow rhinorrhea and sinus congestion. She states that she took robitussin and theraflu without any relief. She reports that she has been having clear eye discharge. She states that she has no sick contacts. She also uses her flonase and antihistamines regularly with no relief.  Pt also reports that she has be having moderate intermittent epigastric abdominal pain that started 4 days, after coughing. She describes the non radiating pain as a tight sensation and rates the severity of the pain as a 2/10 in severity. She reports that the pain worsens with cough, and that she has no other modifying factors otherwise. She denies any fever, chills, chest pain, SOB, ear pain, ear drainage, wheezing, eye itchiness, eye pain, nausea, vomiting, diarrhea, constipation, melena, hematochezia, dysuria, hematuria, numbness or tingling, or weakness.  Past Medical History  Diagnosis Date  . Asthma   . Gestational diabetes   . Hypertension    Past Surgical History  Procedure Laterality Date  . Breast reduction surgery     No family history on file. History  Substance Use Topics  . Smoking status: Never Smoker   . Smokeless tobacco: Not on file  . Alcohol Use: No   OB History    No data available     Review of Systems  Constitutional: Negative for fever and chills.  HENT: Positive for congestion (sinus),  rhinorrhea (yellow) and sinus pressure. Negative for drooling, ear discharge, ear pain, sore throat and trouble swallowing.   Eyes: Positive for discharge (clear). Negative for pain, redness, itching and visual disturbance.  Respiratory: Positive for cough. Negative for shortness of breath and wheezing.   Cardiovascular: Negative for chest pain.  Gastrointestinal: Positive  for abdominal pain (intermittent). Negative for nausea, vomiting, diarrhea, constipation, melena, hematochezia and hematemesis.  Genitourinary: Negative for dysuria, hematuria, vaginal bleeding and vaginal discharge.  Musculoskeletal: Negative for myalgias, back pain and neck pain.  Skin: Negative for rash.  Allergic/Immunologic: Positive for immunocompromised state (diabetic).  Neurological: Negative for weakness, numbness and headaches.  10 Systems reviewed and all are negative for acute change except as noted in the HPI.  Allergies  Review of patient's allergies indicates no known allergies.  Home Medications   Prior to Admission medications   Medication Sig Start Date End Date Taking? Authorizing Provider  albuterol (PROVENTIL HFA;VENTOLIN HFA) 108 (90 BASE) MCG/ACT inhaler Inhale 2 puffs into the lungs every 6 (six) hours as needed for wheezing.    Historical Provider, MD  albuterol (PROVENTIL) (5 MG/ML) 0.5% nebulizer solution Take 2.5 mg by nebulization every 6 (six) hours as needed for wheezing or shortness of breath.    Historical Provider, MD  gabapentin (NEURONTIN) 300 MG capsule Take 1 capsule (300 mg total) by mouth daily. 03/30/14   Quentin Angst, MD  glucose blood (TRUETEST TEST) test strip Use as instructed 03/30/14   Quentin Angst, MD  metFORMIN (GLUCOPHAGE) 500 MG tablet Take 1 tablet (500 mg total) by mouth 2 (two) times daily with a meal. 03/30/14   Quentin Angst, MD  omeprazole (PRILOSEC) 40 MG capsule Take 1 capsule (40 mg total) by mouth daily. 03/30/14   Quentin Angst, MD   BP 141/79 mmHg  Pulse 79  Temp(Src) 98.3 F (36.8 C) (Oral)  SpO2 100%  LMP 07/02/2014 Physical Exam  Constitutional: She is oriented to person, place, and time. Vital signs are normal. She appears well-developed and well-nourished.  Non-toxic appearance. No distress.  Afebrile, nontoxic, NAD  HENT:  Head: Normocephalic and atraumatic.  Right Ear: Hearing, tympanic  membrane, external ear and ear canal normal.  Left Ear: Hearing, tympanic membrane, external ear and ear canal normal.  Nose: Mucosal edema and rhinorrhea present. No sinus tenderness. Right sinus exhibits no maxillary sinus tenderness and no frontal sinus tenderness. Left sinus exhibits no maxillary sinus tenderness and no frontal sinus tenderness.  Mouth/Throat: Uvula is midline, oropharynx is clear and moist and mucous membranes are normal. No trismus in the jaw. No uvula swelling.  Ears clear bilaterally. Nose with mild mucosal edema and yellow rhinorrhea. No sinus TTP. Orophranyx is clear and moist without any tonsillar exudates or swelling.   Eyes: Conjunctivae and EOM are normal. Right eye exhibits no discharge. Left eye exhibits no discharge.  Neck: Normal range of motion. Neck supple.  Cardiovascular: Normal rate, regular rhythm, normal heart sounds and intact distal pulses.  Exam reveals no gallop and no friction rub.   No murmur heard. Pulmonary/Chest: Effort normal and breath sounds normal. No respiratory distress. She has no decreased breath sounds. She has no wheezes. She has no rhonchi. She has no rales.  CTAB in all lung fields, no w/r/r, no hypoxia or increased WOB, speaking in full sentences, SpO2 100% on RA  Abdominal: Soft. Normal appearance and bowel sounds are normal. She exhibits no distension. There is no tenderness. There is no rigidity, no rebound  and no guarding.  Soft, NTND, +BS throughout, no r/g/r  Musculoskeletal: Normal range of motion.  Lymphadenopathy:       Head (right side): No submandibular and no tonsillar adenopathy present.       Head (left side): No submandibular and no tonsillar adenopathy present.    She has no cervical adenopathy.  No head or neck LAD.   Neurological: She is alert and oriented to person, place, and time. She has normal strength. No sensory deficit.  Skin: Skin is warm, dry and intact. No rash noted.  Psychiatric: She has a normal mood  and affect.  Nursing note and vitals reviewed.   ED Course  Procedures (including critical care time) DIAGNOSTIC STUDIES: Oxygen Saturation is 100% on RA, normal by my interpretation.    COORDINATION OF CARE: 7:36 PM- Pt advised of plan for treatment  7:46 PM- Due to duration, going to prescribe antibiotics and call case manager and pt agrees.   Labs Review Labs Reviewed - No data to display  Imaging Review No results found.   EKG Interpretation None      MDM   Final diagnoses:  Cough  Sinus congestion  Other seasonal allergic rhinitis    39 y.o. female here with 3wks of nasal congestion, yellow rhinorrhea, and dry cough. Pt is afebrile with a clear lung exam. Mild rhinorrhea. Due to duration of symptoms, agreed to start abx. Clear lung exam, doubt need for inhaler treatment or CXR. Discussed close follow up with PCP in 1wk, pt states she can't see [facility] as she previously did due to income. Attempted to call case management who was not here at this time. Discussed calling [facility] for f/up and to discuss with case manager there. Discussed ongoing symptomatic control with home flonase, inhalers, and antihistamines. Doubt need for prednisone, doubt asthma exacerbation. Spoke at length about emergent changing or worsening of symptoms that should prompt return to ER. Pt voices understanding and is agreeable to plan. Stable at time of discharge.    I personally performed the services described in this documentation, which was scribed in my presence. The recorded information has been reviewed and is accurate.  BP 141/79 mmHg  Pulse 79  Temp(Src) 98.3 F (36.8 C) (Oral)  SpO2 100%  LMP 07/02/2014  Meds ordered this encounter  Medications  . amoxicillin (AMOXIL) 500 MG tablet    Sig: Take 2 tablets (1,000 mg total) by mouth 2 (two) times daily.    Dispense:  28 tablet    Refill:  0    Order Specific Question:  Supervising Provider    Answer:  Eber HongMILLER, BRIAN [[id number]]        Veleka Djordjevic Camprubi-Soms, PA-C 07/13/14 2000  Tilden FossaElizabeth Rees, MD 07/13/14 2320

## 2014-07-13 NOTE — ED Notes (Signed)
Pt refused d/c vitals - sts "The doctor says I'm ok, so I trust I'm ok."

## 2014-07-31 ENCOUNTER — Telehealth: Payer: Self-pay | Admitting: Emergency Medicine

## 2014-07-31 ENCOUNTER — Other Ambulatory Visit: Payer: Self-pay | Admitting: *Deleted

## 2014-07-31 ENCOUNTER — Telehealth: Payer: Self-pay | Admitting: *Deleted

## 2014-07-31 DIAGNOSIS — E119 Type 2 diabetes mellitus without complications: Secondary | ICD-10-CM

## 2014-07-31 MED ORDER — METFORMIN HCL 500 MG PO TABS: 500.0000 mg | ORAL_TABLET | Freq: Two times a day (BID) | ORAL | Status: DC

## 2014-07-31 NOTE — Telephone Encounter (Signed)
[facility]

## 2014-07-31 NOTE — Telephone Encounter (Signed)
Rx Metforming send to [facility] pharmacy Unable to contact Pt

## 2014-08-18 ENCOUNTER — Emergency Department (HOSPITAL_COMMUNITY)
Admission: EM | Admit: 2014-08-18 | Discharge: 2014-08-18 | Disposition: A | Discharge status: Home / Self Care | Payer: Self-pay | Attending: Emergency Medicine | Admitting: Emergency Medicine

## 2014-08-18 ENCOUNTER — Encounter (HOSPITAL_COMMUNITY): Payer: Self-pay | Admitting: *Deleted

## 2014-08-18 ENCOUNTER — Other Ambulatory Visit: Payer: Self-pay

## 2014-08-18 DIAGNOSIS — J45909 Unspecified asthma, uncomplicated: Secondary | ICD-10-CM | POA: Insufficient documentation

## 2014-08-18 DIAGNOSIS — Z792 Long term (current) use of antibiotics: Secondary | ICD-10-CM | POA: Insufficient documentation

## 2014-08-18 DIAGNOSIS — Z79899 Other long term (current) drug therapy: Secondary | ICD-10-CM | POA: Insufficient documentation

## 2014-08-18 DIAGNOSIS — I10 Essential (primary) hypertension: Secondary | ICD-10-CM | POA: Insufficient documentation

## 2014-08-18 DIAGNOSIS — M62838 Other muscle spasm: Secondary | ICD-10-CM | POA: Insufficient documentation

## 2014-08-18 MED ORDER — MORPHINE SULFATE 4 MG/ML IJ SOLN
4.0000 mg | Freq: Once | INTRAMUSCULAR | Status: AC
  Administered 2014-08-18: 4 mg via INTRAMUSCULAR
  Filled 2014-08-18: qty 1

## 2014-08-18 MED ORDER — HYDROCODONE-ACETAMINOPHEN 5-325 MG PO TABS: 2.0000 | ORAL_TABLET | Freq: Once | ORAL | Status: DC

## 2014-08-18 MED ORDER — OXYCODONE-ACETAMINOPHEN 5-325 MG PO TABS: 2.0000 | ORAL_TABLET | ORAL | Status: DC | PRN

## 2014-08-18 MED ORDER — METHOCARBAMOL 500 MG PO TABS: 500.0000 mg | ORAL_TABLET | Freq: Two times a day (BID) | ORAL | Status: DC

## 2014-08-18 MED ORDER — NAPROXEN 500 MG PO TABS: 500.0000 mg | ORAL_TABLET | Freq: Two times a day (BID) | ORAL | Status: DC

## 2014-08-18 MED ORDER — METHOCARBAMOL 500 MG PO TABS
500.0000 mg | ORAL_TABLET | Freq: Once | ORAL | Status: AC
  Administered 2014-08-18: 500 mg via ORAL
  Filled 2014-08-18: qty 1

## 2014-08-18 NOTE — Progress Notes (Signed)
ED CM noted patient to have had 2 ED visits in the past 6 months. Patient presented to [facility] ED with generalized pain. Patient was  established with  [place]. ED CM met with patient at bedside, patient states, that she was told that she could no longer be a patient at the clinic because of her income, but patient states that she does not make a lot of money.  Patient is concerned because of medical issues. Discussed that patient would need to arrange an appt with the financial counselor at the [facility], but she can follow up at the [facility] clinic tomorrow patient also voiced that she is having difficulty getting her medications since not being allow to continue care at the [facility]. ED CM will contact the [facility] CM in the morning regarding patient. No further ED CM needs identified.

## 2014-08-18 NOTE — ED Provider Notes (Signed)
CSN: [id number]     Arrival date & time 08/18/14  1804 History   First MD Initiated Contact with Patient 08/18/14 2048     Chief Complaint  Patient presents with  . Neck Pain     (Consider location/radiation/quality/duration/timing/severity/associated sxs/prior Treatment) Patient is a 39 y.o. female presenting with neck pain. The history is provided by the patient. No language interpreter was used.  Neck Pain Associated symptoms: no headaches and no weakness   Ms. Vinson MoselleLuyando is a 39 y.o female with a history of HTN and DM who presents with new onset neck pain that began Sunday.  She states the pain is intermittent and sharp with radiation down her right arm.  She has taken ibuprofen, flexeril, and used a heating pad without relief.  She states it is worse when she stands. 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   She denies any headache, fever, chest pain, shortness of breath, nausea, vomiting, or weakness.   Past Medical History  Diagnosis Date  . Asthma   . Gestational diabetes   . Hypertension    Past Surgical History  Procedure Laterality Date  . Breast reduction surgery     No family history on file. History  Substance Use Topics  . Smoking status: Never Smoker   . Smokeless tobacco: Not on file  . Alcohol Use: No   OB History    No data available     Review of Systems  Musculoskeletal: Positive for neck pain. Negative for back pain.  Skin: Negative for rash.  Neurological: Negative for dizziness, weakness and headaches.      Allergies  Review of patient's allergies indicates no known allergies.  Home Medications   Prior to Admission medications   Medication Sig Start Date End Date Taking? Authorizing Provider  albuterol (PROVENTIL HFA;VENTOLIN HFA) 108 (90 BASE) MCG/ACT inhaler Inhale 2 puffs into the lungs every 6 (six) hours as needed for wheezing.   Yes Historical Provider, MD   albuterol (PROVENTIL) (5 MG/ML) 0.5% nebulizer solution Take 2.5 mg by nebulization every 6 (six) hours as needed for wheezing or shortness of breath.   Yes Historical Provider, MD  cetirizine (ZYRTEC) 10 MG tablet Take 10 mg by mouth daily.   Yes Historical Provider, MD  cyclobenzaprine (FLEXERIL) 10 MG tablet Take 10 mg by mouth 3 (three) times daily as needed for muscle spasms.   Yes Historical Provider, MD  gabapentin (NEURONTIN) 300 MG capsule Take 1 capsule (300 mg total) by mouth daily. Patient taking differently: Take 300 mg by mouth 3 (three) times daily.  03/30/14  Yes Quentin Angstlugbemiga E Jegede, MD  ibuprofen (ADVIL,MOTRIN) 200 MG tablet Take 800 mg by mouth every 6 (six) hours as needed.   Yes Historical Provider, MD  metFORMIN (GLUCOPHAGE) 500 MG tablet Take 1 tablet (500 mg total) by mouth 2 (two) times daily with a meal. 07/31/14  Yes Josalyn Funches, MD  naproxen sodium (ANAPROX) 220 MG tablet Take 220 mg by mouth 2 (two) times daily as needed (for pain).   Yes Historical Provider, MD  omeprazole (PRILOSEC) 40 MG capsule Take 1 capsule (40 mg total) by mouth daily. 03/30/14  Yes Quentin Angstlugbemiga E Jegede, MD  amoxicillin (AMOXIL) 500 MG tablet Take 2 tablets (1,000 mg total) by mouth 2 (two) times daily. 07/13/14   Mercedes Camprubi-Soms, PA-C  glucose blood (TRUETEST TEST) test strip Use as instructed 03/30/14  Quentin Angst, MD  methocarbamol (ROBAXIN) 500 MG tablet Take 1 tablet (500 mg total) by mouth 2 (two) times daily. 08/18/14   Avelardo Reesman Patel-Mills, PA-C  naproxen (NAPROSYN) 500 MG tablet Take 1 tablet (500 mg total) by mouth 2 (two) times daily. 08/18/14   Tayanna Talford Patel-Mills, PA-C  oxyCODONE-acetaminophen (PERCOCET/ROXICET) 5-325 MG per tablet Take 2 tablets by mouth every 4 (four) hours as needed for severe pain. 08/18/14   Susan Bleich Patel-Mills, PA-C   BP 146/88 mmHg  Pulse 66  Temp(Src) 98.2 F (36.8 C) (Oral)  Resp 18  SpO2 100%  LMP 07/28/2014 Physical Exam  Constitutional: She is  oriented to person, place, and time. She appears well-developed and well-nourished.  HENT:  Head: Normocephalic and atraumatic.  Eyes: Conjunctivae are normal.  Neck: Normal range of motion. Neck supple. Muscular tenderness present. No spinous process tenderness present. Normal range of motion present. No Kernig's sign noted.  Reproducible right cervical musculoskeletal tenderness to palpation.   Cardiovascular: Normal rate, regular rhythm and normal heart sounds.   Pulmonary/Chest: Effort normal and breath sounds normal.  Abdominal: Soft. There is no tenderness.  Musculoskeletal: Normal range of motion.  Neurological: She is alert and oriented to person, place, and time. She has normal strength. No sensory deficit. GCS eye subscore is 4. GCS verbal subscore is 5. GCS motor subscore is 6.  Skin: Skin is warm and dry.  Nursing note and vitals reviewed.   ED Course  Procedures (including critical care time) Labs Review Labs Reviewed - No data to display  Imaging Review No results found.   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    She has no focal neurological deficit.  No meningeal signs.  No injury to the neck.  The pain is reproducible and worse with movement.   I believe this is a muscle spasm.  I have given her robaxin, percocet for break through pain and naproxen.  I did explain that she should not work on the line while taking percocet and muscle relaxers. She was given a work note.   Burna Mortimer from social services spoke with her regarding follow up at [facility] since she is self pay and she agrees with the plan.      Catha Gosselin, PA-C 08/19/14 4782  Richardean Canal, MD 08/20/14 [phone]

## 2014-08-18 NOTE — ED Notes (Signed)
Pt states that she has had neck, rt arm and back pain for several days. Pt states that he has had tingling and numbness to rt arm as well. Pt reports starting a new job and thinks that the movement and lifting my have cause the pain. Area is tender to palpation.

## 2014-08-18 NOTE — Discharge Instructions (Signed)
Calambres y espasmos musculares  Follow up with a provider using the resource guide. Apply Ice or heat.  Take medications as prescribed.  Do not drive or operate heavy machinery while taking these medications.  (Muscle Cramps and Spasms)  Los calambres musculares y espasmos ocurren cuando un msculo o grupos de msculos se tensan y no se tiene control sobre esta tensin (contraccin muscular involuntaria). Es un problema comn y Software engineerpuede aparecer en cualquier msculo. La zona ms comn son los msculos de la pantorrilla. Tanto los Liberty Globalcalambres como los espasmos son contracciones musculares involuntarias, pero tambin tienen diferencias:   Los calambres musculares son espordicos y Engineer, miningdolorosos. Pueden durar entre algunos segundos hasta un cuarto de [place]hora. Los calambres musculares son ms fuertes y duran ms que los espasmos musculares.  Los espasmos pueden o no ser dolorosos. Pueden durar algunos segundos o mucho ms. CAUSAS  No es frecuente que los calambres se deban a un trastorno subyacente grave. En muchos casos, la causa de los calambres y los espasmos es desconocida. Algunas causas frecuentes son:   Esfuerzo excesivo.   El uso excesivo del msculo por movimientos repetitivos (hacer lo mismo una y Laverda Pageotra vez).   Permanecer en cierta posicin durante un largo perodo de [place]tiempo.   Preparacin, forma o tcnica inadecuada al realizar un deporte o [place]oreactividad.   Deshidratacin.   Traumatismos.   Efectos secundarios de algunos medicamentos.  Niveles anormalmente bajos de las sales e iones en la sangre (electrolitos), especialmente el potasio y el calcio. Pueden ocurrir cuando se toman pldoras para Geographical information systems officerorinar (diurticos) o en las mujeres embarazadas.  Algunos problemas mdicos subyacentes pueden hacer que sea ms propenso a desarrollar calambres o espasmos. Estos incluyen, pero no se limitan a:   Diabetes.   Enfermedad de Parkinson.   Trastornos hormonales, tales como problemas de la tiroides.    El consumo excesivo de alcohol.   Enfermedades especficas de Harrah's Entertainmentlos msculos, las articulaciones y [place]os huesos.   Enfermedad vascular en la que no llega suficiente sangre a los msculos.  INSTRUCCIONES PARA EL CUIDADO EN EL HOGAR   Mantngase bien hidratado. Beba gran cantidad de lquido para mantener la orina de tono claro o color amarillo plido.  Puede ser til Engineer, maintenance (IT)masajear, Therapist, musicelongar y International aid/development workerrelajar el msculo afectado.  Para los msculos tensos o apretados, use una toalla caliente, una almohadilla trmica o agua caliente de la ducha dirigida a la zona afectada.  Si est dolorido o siente dolor despus de un calambre o espasmo, aplique hielo en el rea afectada para Acupuncturistaliviar el malestar.  Ponga el hielo en una bolsa plstica.  Colquese una toalla entre la piel y la bolsa de hielo.  Deje el hielo en el lugar durante 15 a 20 minutos, 3 a 4 veces por da.  Los medicamentos que se utilizan para tratar las causas conocidas de los calambres o espasmos pueden reducir su frecuencia o gravedad. Tome slo medicamentos de venta libre o recetados, segn las indicaciones del mdico. SOLICITE ATENCIN MDICA SI:  Los calambres o espasmos empeoran, ocurren con ms frecuencia o no mejoran con Museum/gallery conservatorel tiempo.  ASEGRESE DE QUE:   Comprende estas instrucciones.  Controlar su enfermedad.  Solicitar ayuda de inmediato si no mejora o si empeora. Document Released: 01/18/2005 Document Revised: 08/05/2012 [facility] Patient Information 2015 [place] LakeExitCare, MarylandLLC. This information is not intended to replace advice given to you by your health care provider. Make sure you discuss any questions you have with your health care provider.

## 2014-08-25 ENCOUNTER — Emergency Department (HOSPITAL_COMMUNITY): Payer: Self-pay

## 2014-08-25 ENCOUNTER — Emergency Department (HOSPITAL_COMMUNITY)
Admission: EM | Admit: 2014-08-25 | Discharge: 2014-08-25 | Disposition: A | Discharge status: Home / Self Care | Payer: Self-pay | Attending: Emergency Medicine | Admitting: Emergency Medicine

## 2014-08-25 ENCOUNTER — Encounter (HOSPITAL_COMMUNITY): Payer: Self-pay | Admitting: *Deleted

## 2014-08-25 DIAGNOSIS — Z791 Long term (current) use of non-steroidal anti-inflammatories (NSAID): Secondary | ICD-10-CM | POA: Insufficient documentation

## 2014-08-25 DIAGNOSIS — Z3202 Encounter for pregnancy test, result negative: Secondary | ICD-10-CM | POA: Insufficient documentation

## 2014-08-25 DIAGNOSIS — R200 Anesthesia of skin: Secondary | ICD-10-CM | POA: Insufficient documentation

## 2014-08-25 DIAGNOSIS — I10 Essential (primary) hypertension: Secondary | ICD-10-CM | POA: Insufficient documentation

## 2014-08-25 DIAGNOSIS — Z792 Long term (current) use of antibiotics: Secondary | ICD-10-CM | POA: Insufficient documentation

## 2014-08-25 DIAGNOSIS — Z79899 Other long term (current) drug therapy: Secondary | ICD-10-CM | POA: Insufficient documentation

## 2014-08-25 DIAGNOSIS — J45909 Unspecified asthma, uncomplicated: Secondary | ICD-10-CM | POA: Insufficient documentation

## 2014-08-25 DIAGNOSIS — R42 Dizziness and giddiness: Secondary | ICD-10-CM | POA: Insufficient documentation

## 2014-08-25 LAB — COMPREHENSIVE METABOLIC PANEL
ALT: 11 U/L — AB (ref 14–54)
AST: 14 U/L — AB (ref 15–41)
Albumin: 3.3 g/dL — ABNORMAL LOW (ref 3.5–5.0)
Alkaline Phosphatase: 83 U/L (ref 38–126)
Anion gap: 8 (ref 5–15)
BUN: 14 mg/dL (ref 6–20)
CALCIUM: 8.8 mg/dL — AB (ref 8.9–10.3)
CHLORIDE: 105 mmol/L (ref 101–111)
CO2: 27 mmol/L (ref 22–32)
CREATININE: 0.62 mg/dL (ref 0.44–1.00)
GLUCOSE: 123 mg/dL — AB (ref 70–99)
Potassium: 3.7 mmol/L (ref 3.5–5.1)
Sodium: 140 mmol/L (ref 135–145)
Total Bilirubin: 0.4 mg/dL (ref 0.3–1.2)
Total Protein: 7.3 g/dL (ref 6.5–8.1)

## 2014-08-25 LAB — CBC WITH DIFFERENTIAL/PLATELET
Basophils Absolute: 0 10*3/uL (ref 0.0–0.1)
Basophils Relative: 0 % (ref 0–1)
EOS ABS: 0.3 10*3/uL (ref 0.0–0.7)
Eosinophils Relative: 3 % (ref 0–5)
HEMATOCRIT: 34 % — AB (ref 36.0–46.0)
Hemoglobin: 10.7 g/dL — ABNORMAL LOW (ref 12.0–15.0)
LYMPHS PCT: 27 % (ref 12–46)
Lymphs Abs: 3.4 10*3/uL (ref 0.7–4.0)
MCH: 24 pg — AB (ref 26.0–34.0)
MCHC: 31.5 g/dL (ref 30.0–36.0)
MCV: 76.2 fL — AB (ref 78.0–100.0)
MONO ABS: 0.6 10*3/uL (ref 0.1–1.0)
Monocytes Relative: 5 % (ref 3–12)
Neutro Abs: 8.2 10*3/uL — ABNORMAL HIGH (ref 1.7–7.7)
Neutrophils Relative %: 65 % (ref 43–77)
Platelets: 301 10*3/uL (ref 150–400)
RBC: 4.46 MIL/uL (ref 3.87–5.11)
RDW: 15.9 % — AB (ref 11.5–15.5)
WBC: 12.6 10*3/uL — AB (ref 4.0–10.5)

## 2014-08-25 LAB — URINE MICROSCOPIC-ADD ON

## 2014-08-25 LAB — PREGNANCY, URINE: Preg Test, Ur: NEGATIVE

## 2014-08-25 LAB — URINALYSIS, ROUTINE W REFLEX MICROSCOPIC
BILIRUBIN URINE: NEGATIVE
GLUCOSE, UA: NEGATIVE mg/dL
Ketones, ur: NEGATIVE mg/dL
Leukocytes, UA: NEGATIVE
Nitrite: NEGATIVE
Protein, ur: NEGATIVE mg/dL
Specific Gravity, Urine: 1.019 (ref 1.005–1.030)
UROBILINOGEN UA: 1 mg/dL (ref 0.0–1.0)
pH: 6 (ref 5.0–8.0)

## 2014-08-25 MED ORDER — LORAZEPAM 1 MG PO TABS
0.5000 mg | ORAL_TABLET | Freq: Once | ORAL | Status: AC
Start: 2014-08-25 — End: 2014-08-25
  Administered 2014-08-25: 0.5 mg via ORAL
  Filled 2014-08-25: qty 1

## 2014-08-25 MED ORDER — MECLIZINE HCL 25 MG PO TABS
25.0000 mg | ORAL_TABLET | Freq: Once | ORAL | Status: AC
  Administered 2014-08-25: 25 mg via ORAL
  Filled 2014-08-25: qty 1

## 2014-08-25 MED ORDER — MECLIZINE HCL 25 MG PO TABS: 25.0000 mg | ORAL_TABLET | Freq: Four times a day (QID) | ORAL | Status: DC | PRN

## 2014-08-25 NOTE — ED Provider Notes (Signed)
Care [facility] Sofia PA-C. Pt here for dizziness. Unable to ambulate therefore MRI ordered. Plan is to f/up with MRI and if normal, d/c home with antivert +/- ativan although these didn't help here. Physical Exam  BP 141/68 mmHg  Pulse 58  Temp(Src) 98.1 F (36.7 C) (Oral)  Resp 15  Ht 5\' 4"  (1.626 m)  Wt 265 lb (120.203 kg)  BMI 45.46 kg/m2  SpO2 100%  LMP 07/28/2014  Physical Exam Gen: afebrile, VSS, NAD HEENT: EOMI, MMM, PERRL Resp: no resp distress CV: rate WNL Abd: appearance normal, nondistended MsK: moving all extremities with ease, steady gait without assistance, no focal neuro deficits Neuro: A&O x4  ED Course  Procedures Results for orders placed or performed during the hospital encounter of 08/25/14  Pregnancy, urine  Result Value Ref Range   Preg Test, Ur NEGATIVE NEGATIVE  Urinalysis, Routine w reflex microscopic  Result Value Ref Range   Color, Urine YELLOW YELLOW   APPearance CLEAR CLEAR   Specific Gravity, Urine 1.019 1.005 - 1.030   pH 6.0 5.0 - 8.0   Glucose, UA NEGATIVE NEGATIVE mg/dL   Hgb urine dipstick TRACE (A) NEGATIVE   Bilirubin Urine NEGATIVE NEGATIVE   Ketones, ur NEGATIVE NEGATIVE mg/dL   Protein, ur NEGATIVE NEGATIVE mg/dL   Urobilinogen, UA 1.0 0.0 - 1.0 mg/dL   Nitrite NEGATIVE NEGATIVE   Leukocytes, UA NEGATIVE NEGATIVE  CBC with Differential/Platelet  Result Value Ref Range   WBC 12.6 (H) 4.0 - 10.5 K/uL   RBC 4.46 3.87 - 5.11 MIL/uL   Hemoglobin 10.7 (L) 12.0 - 15.0 g/dL   HCT 04.534.0 (L) 40.936.0 - 81.146.0 %   MCV 76.2 (L) 78.0 - 100.0 fL   MCH 24.0 (L) 26.0 - 34.0 pg   MCHC 31.5 30.0 - 36.0 g/dL   RDW 91.415.9 (H) 78.211.5 - 95.615.5 %   Platelets 301 150 - 400 K/uL   Neutrophils Relative % 65 43 - 77 %   Neutro Abs 8.2 (H) 1.7 - 7.7 K/uL   Lymphocytes Relative 27 12 - 46 %   Lymphs Abs 3.4 0.7 - 4.0 K/uL   Monocytes Relative 5 3 - 12 %   Monocytes Absolute 0.6 0.1 - 1.0 K/uL   Eosinophils Relative 3 0 - 5 %   Eosinophils Absolute 0.3 0.0 - 0.7 K/uL   Basophils Relative 0 0 - 1 %   Basophils Absolute 0.0 0.0 - 0.1 K/uL  Comprehensive metabolic panel  Result Value Ref Range   Sodium 140 135 - 145 mmol/L   Potassium 3.7 3.5 - 5.1 mmol/L   Chloride 105 101 - 111 mmol/L   CO2 27 22 - 32 mmol/L   Glucose, Bld 123 (H) 70 - 99 mg/dL   BUN 14 6 - 20 mg/dL   Creatinine, Ser 2.130.62 0.44 - 1.00 mg/dL   Calcium 8.8 (L) 8.9 - 10.3 mg/dL   Total Protein 7.3 6.5 - 8.1 g/dL   Albumin 3.3 (L) 3.5 - 5.0 g/dL   AST 14 (L) 15 - 41 U/L   ALT 11 (L) 14 - 54 U/L   Alkaline Phosphatase 83 38 - 126 U/L   Total Bilirubin 0.4 0.3 - 1.2 mg/dL   GFR calc non Af Amer >60 >60 mL/min   GFR calc Af Amer >60 >60 mL/min   Anion gap 8 5 - 15  Urine microscopic-add on  Result Value Ref Range   Squamous Epithelial / LPF FEW (A) RARE  WBC, UA 3-6 <3 WBC/hpf   RBC / HPF 0-2 <3 RBC/hpf   Bacteria, UA FEW (A) RARE   Mr Brain Wo Contrast  08/25/2014   CLINICAL DATA:  Dizziness.  EXAM: MRI HEAD WITHOUT CONTRAST  TECHNIQUE: Multiplanar, multiecho pulse sequences of the brain and surrounding structures were obtained without intravenous contrast.  COMPARISON:  None.  FINDINGS: Cerebral volume is normal. Ventricles normal in size. Pituitary normal in size. Negative for Chiari malformation.  Negative for acute or chronic ischemia  Negative for demyelinating disease. Cerebral white matter is normal. Brainstem and cerebellum normal.  Negative for intracranial hemorrhage  Negative for mass or edema  Mild mucosal edema in the paranasal sinuses. Mastoid sinus clear bilaterally.  IMPRESSION: Normal MRI of the brain  Mild mucosal edema paranasal sinuses.   Electronically Signed   By: Marlan Palau M.D.   On: 08/25/2014 17:44     Meds ordered this encounter  Medications  . meclizine (ANTIVERT) tablet 25 mg    Sig:   . LORazepam (ATIVAN) tablet 0.5 mg    Sig:   . meclizine (ANTIVERT) 25 MG tablet    Sig: Take 1 tablet (25 mg total) by  mouth every 6 (six) hours as needed for dizziness.    Dispense:  20 tablet    Refill:  0    Order Specific Question:  Supervising Provider    AnswerEber Hong [[id number]]   EKG Interpretation  Date/Time:  Tuesday Aug 25 2014 11:17:02 EDT Ventricular Rate:  76 PR Interval:  154 QRS Duration: 104 QT Interval:  418 QTC Calculation: 470 R Axis:   12 Text Interpretation:  Sinus rhythm Borderline T abnormalities, anterior leads No significant change since last tracing Confirmed by NANAVATI, MD, ANKIT [phone]) on 08/25/2014 11:49:57 AM    MDM:   ICD-9-CM ICD-10-CM   1. Dizziness 780.4 R42      6:20 PM MRI showing mild paranasal sinus edema, no other etiology for her dizziness. Ambulatory without assistance. Will start antivert and have her f/up with PCP in 1wk. Doubt further work up needed. Doubt need for neuro f/up at this time since this seems to be orthostatic (pt sat up and got dizzy for a few seconds then was fine). I explained the diagnosis and have given explicit precautions to return to the ER including for any other new or worsening symptoms. The patient understands and accepts the medical plan as it's been dictated and I have answered their questions. Discharge instructions concerning home care and prescriptions have been given. The patient is STABLE and is discharged to home in good condition.   Torry Adamczak Camprubi-Soms, PA-C 08/25/14 1825

## 2014-08-25 NOTE — ED Notes (Signed)
Pt states that she has had intermittent dizziness and nausea since yesterday. Pt states that the dizziness woke her from sleep this morning. Pt alert and oriented.

## 2014-08-25 NOTE — ED Provider Notes (Signed)
CSN: [id number]     Arrival date & time 08/25/14  1105 History   First MD Initiated Contact with Patient 08/25/14 1205     Chief Complaint  Patient presents with  . Dizziness     (Consider location/radiation/quality/duration/timing/severity/associated sxs/prior Treatment) Patient is a 39 y.o. female presenting with dizziness. The history is provided by the patient. No language interpreter was used.  Dizziness Quality:  Unable to specify Severity:  Severe Duration:  1 day Timing:  Constant Progression:  Worsening Chronicity:  New Context: head movement and standing up   Relieved by:  Nothing Worsened by:  Nothing Ineffective treatments:  None tried Risk factors: no hx of vertigo, no Meniere's disease and no multiple medications     Past Medical History  Diagnosis Date  . Asthma   . Gestational diabetes   . Hypertension    Past Surgical History  Procedure Laterality Date  . Breast reduction surgery     No family history on file. History  Substance Use Topics  . Smoking status: Never Smoker   . Smokeless tobacco: Not on file  . Alcohol Use: No   OB History    No data available     Review of Systems  Neurological: Positive for dizziness, light-headedness and numbness.  All other systems reviewed and are negative.     Allergies  Review of patient's allergies indicates no known allergies.  Home Medications   Prior to Admission medications   Medication Sig Start Date End Date Taking? Authorizing Provider  albuterol (PROVENTIL HFA;VENTOLIN HFA) 108 (90 BASE) MCG/ACT inhaler Inhale 2 puffs into the lungs every 6 (six) hours as needed for wheezing.    Historical Provider, MD  albuterol (PROVENTIL) (5 MG/ML) 0.5% nebulizer solution Take 2.5 mg by nebulization every 6 (six) hours as needed for wheezing or shortness of breath.    Historical Provider, MD  amoxicillin (AMOXIL) 500 MG tablet Take 2 tablets (1,000 mg total) by mouth 2 (two) times daily. 07/13/14   Mercedes  Camprubi-Soms, PA-C  cetirizine (ZYRTEC) 10 MG tablet Take 10 mg by mouth daily.    Historical Provider, MD  cyclobenzaprine (FLEXERIL) 10 MG tablet Take 10 mg by mouth 3 (three) times daily as needed for muscle spasms.    Historical Provider, MD  gabapentin (NEURONTIN) 300 MG capsule Take 1 capsule (300 mg total) by mouth daily. Patient taking differently: Take 300 mg by mouth 3 (three) times daily.  03/30/14   Quentin Angst, MD  glucose blood (TRUETEST TEST) test strip Use as instructed 03/30/14   Quentin Angst, MD  ibuprofen (ADVIL,MOTRIN) 200 MG tablet Take 800 mg by mouth every 6 (six) hours as needed.    Historical Provider, MD  metFORMIN (GLUCOPHAGE) 500 MG tablet Take 1 tablet (500 mg total) by mouth 2 (two) times daily with a meal. 07/31/14   Dessa Phi, MD  methocarbamol (ROBAXIN) 500 MG tablet Take 1 tablet (500 mg total) by mouth 2 (two) times daily. 08/18/14   Hanna Patel-Mills, PA-C  naproxen (NAPROSYN) 500 MG tablet Take 1 tablet (500 mg total) by mouth 2 (two) times daily. 08/18/14   Hanna Patel-Mills, PA-C  naproxen sodium (ANAPROX) 220 MG tablet Take 220 mg by mouth 2 (two) times daily as needed (for pain).    Historical Provider, MD  omeprazole (PRILOSEC) 40 MG capsule Take 1 capsule (40 mg total) by mouth daily. 03/30/14   Quentin Angst, MD  oxyCODONE-acetaminophen (PERCOCET/ROXICET) 5-325 MG per tablet Take 2 tablets by mouth  every 4 (four) hours as needed for severe pain. 08/18/14   Hanna Patel-Mills, PA-C   BP 133/68 mmHg  Pulse 72  Temp(Src) 98 F (36.7 C) (Oral)  Resp 16  Ht 5\' 4"  (1.626 m)  Wt 265 lb (120.203 kg)  BMI 45.46 kg/m2  SpO2 97%  LMP 07/28/2014 Physical Exam  Constitutional: She is oriented to person, place, and time. She appears well-developed and well-nourished.  HENT:  Head: Normocephalic.  Right Ear: External ear normal.  Left Ear: External ear normal.  Mouth/Throat: Oropharynx is clear and moist.  Eyes: Conjunctivae and EOM are  normal. Pupils are equal, round, and reactive to light.  Neck: Normal range of motion.  Cardiovascular: Normal rate.   Pulmonary/Chest: Effort normal and breath sounds normal.  Abdominal: Soft. She exhibits no distension.  Musculoskeletal: Normal range of motion.  Neurological: She is alert and oriented to person, place, and time.  RN reports pt unable to stand.  Pt staggers and grabs to keep from falling  Skin: Skin is warm.  Psychiatric: She has a normal mood and affect.  Nursing note and vitals reviewed.   ED Course  Procedures (including critical care time) Labs Review Labs Reviewed  CBC WITH DIFFERENTIAL/PLATELET - Abnormal; Notable for the following:    WBC 12.6 (*)    Hemoglobin 10.7 (*)    HCT 34.0 (*)    MCV 76.2 (*)    MCH 24.0 (*)    RDW 15.9 (*)    Neutro Abs 8.2 (*)    All other components within normal limits  COMPREHENSIVE METABOLIC PANEL - Abnormal; Notable for the following:    Glucose, Bld 123 (*)    Calcium 8.8 (*)    Albumin 3.3 (*)    AST 14 (*)    ALT 11 (*)    All other components within normal limits  PREGNANCY, URINE  URINALYSIS, ROUTINE W REFLEX MICROSCOPIC    Imaging Review No results found.   EKG Interpretation   Date/Time:  Tuesday Aug 25 2014 11:17:02 EDT Ventricular Rate:  76 PR Interval:  154 QRS Duration: 104 QT Interval:  418 QTC Calculation: 470 R Axis:   12 Text Interpretation:  Sinus rhythm Borderline T abnormalities, anterior  leads No significant change since last tracing Confirmed by Rhunette CroftNANAVATI, MD,  Janey GentaANKIT [id number]) on 08/25/2014 11:49:57 AM      MDM  Dr. Rhunette CroftNanavati in to see and examine.   Pt reports no relief with ativan and antivert.  Mri ordered.     Final diagnoses:  None    Pt's care turned over to BergholzMercedes PA with MRi pending.    Lonia SkinnerLeslie K SebewaingSofia, PA-C 08/25/14 1634  Derwood KaplanAnkit Nanavati, MD 08/27/14 [phone]

## 2014-08-25 NOTE — Discharge Instructions (Signed)
Use antivert as directed as needed for dizziness. Continue taking your home medications. Stay well hydrated. Follow up with your regular doctor in 1 week. Return to the ER for changes or worsening symptoms.   Mareos  (Dizziness)  Tener mareos significa sentirse inestable o ligeramente aturdido. Siente como si fuera a Associate Professorperder el conocimiento (desmayarse). CUIDADOS EN EL HOGAR   Beba gran cantidad de lquido para mantener el pis (orina) de tono claro o amarillo plido.  Tome los medicamentos exactamente como le indic el mdico. Si toma medicamentos para la hipertensin, siempre prese lentamente luego de estar sentado o acostado. Sostngase en algo para American Expressmantenerse firme.  Si debe permanecer de pie en un lugar durante un largo tiempo, mueva las piernas con frecuencia. Tensione y relaje los msculos de las piernas.  Pdale a alguna persona que se quede con usted hasta que se sienta bien.  No conduzca vehculos ni utilice maquinarias pesadas si se siente mareado.  No beba alcohol. SOLICITE AYUDA DE INMEDIATO SI:   Siente mareos o aturdimiento y McDonald's Corporationestos empeoran.  Tiene ganas de vomitar (nuseas) o vmitos.  Tiene dificultad para hablar o para caminar.  Se siente dbil o tiene problemas para Boeingusar los brazos, las manos o las piernas.  No puede pensar claramente o tiene dificultad para formar oraciones.  Siente dolor en el pecho, en el vientre (abdomen) tiene sudoracin o Company secretaryle falta el aire.  Su visin se modifica.  Tiene un sangrado.  Tuvo problemas con los medicamentos y Designer, multimediaestos parecen empeorar. ASEGRESE DE QUE:   Comprende estas instrucciones.  Controlar su enfermedad.  Solicitar ayuda de inmediato si no mejora o si empeora. Document Released: 03/30/2011 Document Revised: 07/03/2011 [facility]Exit[facility] Patient Information 2015 [place]ExitCare, [place]LLC. This information is not intended to replace advice given to you by your health care provider. Make sure you discuss any questions you have with  your health care provider.

## 2014-08-25 NOTE — ED Notes (Signed)
Patient transported to MRI 

## 2014-08-25 NOTE — ED Notes (Signed)
Pt. oob to the bedside commode.  Pt. 's gait steady

## 2014-08-25 NOTE — ED Notes (Signed)
Assisted patient onto and off bedside commode.

## 2014-08-25 NOTE — ED Notes (Signed)
Speaking with pt. Via interrupter phone.  Pt. Reports, I have been dizzy 8 times since this morning."  Pt. s stated, "I feel like I am on a ride at the fair and am spinning really fast and I have to hold on to something."   She reports having this woke her up in her sleep, she stated, "I was dizzy while I was sleeping."  Pt. Denies any fever, sob nausea at present time.  She stated, "I felt a little dizzy yesterday morning."  She stated, "I  Do not take care of my self, I could be pregnant."  Denies any UTI symptoms.  Explained to pt. That we are going to do blood work and urine specimen via interrupter.  Also informed her of giving her medication for her dizziness.   She verbalized understanding. She denies any pain

## 2014-09-14 ENCOUNTER — Other Ambulatory Visit: Payer: Self-pay | Admitting: Internal Medicine

## 2014-10-17 ENCOUNTER — Encounter (HOSPITAL_COMMUNITY): Payer: Self-pay | Admitting: *Deleted

## 2014-10-17 ENCOUNTER — Inpatient Hospital Stay (HOSPITAL_COMMUNITY)
Admission: AD | Admit: 2014-10-17 | Discharge: 2014-10-17 | Disposition: A | Discharge status: Home / Self Care | Payer: Medicaid Other | Source: Ambulatory Visit | Attending: Obstetrics & Gynecology | Admitting: Obstetrics & Gynecology

## 2014-10-17 ENCOUNTER — Inpatient Hospital Stay (HOSPITAL_COMMUNITY): Payer: Self-pay

## 2014-10-17 DIAGNOSIS — E119 Type 2 diabetes mellitus without complications: Secondary | ICD-10-CM | POA: Insufficient documentation

## 2014-10-17 DIAGNOSIS — O99611 Diseases of the digestive system complicating pregnancy, first trimester: Secondary | ICD-10-CM

## 2014-10-17 DIAGNOSIS — O4691 Antepartum hemorrhage, unspecified, first trimester: Secondary | ICD-10-CM | POA: Diagnosis not present

## 2014-10-17 DIAGNOSIS — O3680X0 Pregnancy with inconclusive fetal viability, not applicable or unspecified: Secondary | ICD-10-CM

## 2014-10-17 DIAGNOSIS — K5900 Constipation, unspecified: Secondary | ICD-10-CM | POA: Insufficient documentation

## 2014-10-17 DIAGNOSIS — M797 Fibromyalgia: Secondary | ICD-10-CM | POA: Insufficient documentation

## 2014-10-17 DIAGNOSIS — O24111 Pre-existing diabetes mellitus, type 2, in pregnancy, first trimester: Secondary | ICD-10-CM | POA: Insufficient documentation

## 2014-10-17 DIAGNOSIS — O209 Hemorrhage in early pregnancy, unspecified: Secondary | ICD-10-CM | POA: Insufficient documentation

## 2014-10-17 DIAGNOSIS — Z3A10 10 weeks gestation of pregnancy: Secondary | ICD-10-CM | POA: Insufficient documentation

## 2014-10-17 LAB — CBC
HCT: 34.8 % — ABNORMAL LOW (ref 36.0–46.0)
Hemoglobin: 11.4 g/dL — ABNORMAL LOW (ref 12.0–15.0)
MCH: 24.9 pg — ABNORMAL LOW (ref 26.0–34.0)
MCHC: 32.8 g/dL (ref 30.0–36.0)
MCV: 76.1 fL — ABNORMAL LOW (ref 78.0–100.0)
Platelets: 287 10*3/uL (ref 150–400)
RBC: 4.57 MIL/uL (ref 3.87–5.11)
RDW: 16 % — ABNORMAL HIGH (ref 11.5–15.5)
WBC: 15.7 10*3/uL — ABNORMAL HIGH (ref 4.0–10.5)

## 2014-10-17 LAB — URINALYSIS, ROUTINE W REFLEX MICROSCOPIC
BILIRUBIN URINE: NEGATIVE
Glucose, UA: NEGATIVE mg/dL
Ketones, ur: NEGATIVE mg/dL
LEUKOCYTES UA: NEGATIVE
Nitrite: NEGATIVE
Protein, ur: NEGATIVE mg/dL
Specific Gravity, Urine: 1.02 (ref 1.005–1.030)
Urobilinogen, UA: 0.2 mg/dL (ref 0.0–1.0)
pH: 6 (ref 5.0–8.0)

## 2014-10-17 LAB — WET PREP, GENITAL
Clue Cells Wet Prep HPF POC: NONE SEEN
Trich, Wet Prep: NONE SEEN
Yeast Wet Prep HPF POC: NONE SEEN

## 2014-10-17 LAB — URINE MICROSCOPIC-ADD ON

## 2014-10-17 LAB — POCT PREGNANCY, URINE: Preg Test, Ur: POSITIVE — AB

## 2014-10-17 LAB — ABO/RH: ABO/RH(D): O POS

## 2014-10-17 LAB — HCG, QUANTITATIVE, PREGNANCY: hCG, Beta Chain, Quant, S: 67099 m[IU]/mL — ABNORMAL HIGH (ref <5)

## 2014-10-17 MED ORDER — ACETAMINOPHEN 325 MG PO TABS: 650.0000 mg | ORAL_TABLET | Freq: Once | ORAL | Status: DC

## 2014-10-17 NOTE — MAU Provider Note (Signed)
History     CSN: [id number]  Arrival date and time: 10/17/14 1335   First Provider Initiated Contact with Patient 10/17/14 1500      Chief Complaint  Patient presents with  . Vaginal Bleeding   HPI  Lisa Moreno is a 39 y.o. G2P1001 at [redacted]w[redacted]d. She presents with multiple c/o.  She started pink spotting with wiping yesterday, had more today. She has low back pain that radiates to low abd and down her legs x2 wks, it started off/on, is constant now.  She denies changes to discharge, odor or itching. She has urinary frequency, no urgency or dysuria. She is nauseated x 2 wks, no vomiting. She is constipated, no BM x 2 wks. She had pain like this before when had an infection in her intestine. Same partner x 9 yr. She has hx GDM insulin controlled, was dx DM type II  last year. She has not taken her medication x 2 m because she felt it was dropping her too low. She has lost 12 # in the past 2 m, not trying, is losing strength in her hands and started her hip pain 2 m ago.  She is waiting to get pregnancy Medicaid to go to [facility] care.  OB History    Gravida Para Term Preterm AB TAB SAB Ectopic Multiple Living   2 1 1       1       Past Medical History  Diagnosis Date  . Asthma   . Gestational diabetes   . Hypertension     Past Surgical History  Procedure Laterality Date  . Breast reduction surgery      History reviewed. No pertinent family history.  History  Substance Use Topics  . Smoking status: Never Smoker   . Smokeless tobacco: Not on file  . Alcohol Use: No    Allergies: No Known Allergies  Prescriptions prior to admission  Medication Sig Dispense Refill Last Dose  . acetaminophen (TYLENOL) 500 MG tablet Take 500 mg by mouth every 6 (six) hours as needed.   10/16/2014 at Unknown time  . gabapentin (NEURONTIN) 300 MG capsule Take 1 capsule (300 mg total) by mouth daily. (Patient taking differently: Take 300 mg by mouth 3 (three) times daily. ) 90 capsule  3 Past Month at Unknown time  . ibuprofen (ADVIL,MOTRIN) 200 MG tablet Take 800 mg by mouth every 6 (six) hours as needed.   10/17/2014 at Unknown time  . meclizine (ANTIVERT) 25 MG tablet Take 1 tablet (25 mg total) by mouth every 6 (six) hours as needed for dizziness. 20 tablet 0 Past Month at Unknown time  . methocarbamol (ROBAXIN) 500 MG tablet Take 1 tablet (500 mg total) by mouth 2 (two) times daily. 15 tablet 0 Past Month at Unknown time  . naproxen (NAPROSYN) 500 MG tablet Take 1 tablet (500 mg total) by mouth 2 (two) times daily. 30 tablet 0 Past Week at Unknown time  . OVER THE COUNTER MEDICATION 1 tablet. Over the counter sleep aid from [employer]   10/16/2014 at Unknown time  . albuterol (PROVENTIL HFA;VENTOLIN HFA) 108 (90 BASE) MCG/ACT inhaler Inhale 2 puffs into the lungs every 6 (six) hours as needed for wheezing.   prn  . albuterol (PROVENTIL) (5 MG/ML) 0.5% nebulizer solution Take 2.5 mg by nebulization every 6 (six) hours as needed for wheezing or shortness of breath.   prn  . amoxicillin (AMOXIL) 500 MG tablet Take 2 tablets (1,000  mg total) by mouth 2 (two) times daily. (Patient not taking: Reported on 10/17/2014) 28 tablet 0 Completed Course at Unknown time  . cetirizine (ZYRTEC) 10 MG tablet Take 10 mg by mouth daily.   prn  . glucose blood (TRUETEST TEST) test strip Use as instructed 100 each 12 prn  . metFORMIN (GLUCOPHAGE) 500 MG tablet Take 1 tablet (500 mg total) by mouth 2 (two) times daily with a meal. (Patient not taking: Reported on 10/17/2014) 180 tablet 0 Not Taking at Unknown time    Review of Systems  Constitutional: Negative for fever and chills.  Gastrointestinal: Positive for nausea, abdominal pain and constipation. Negative for vomiting and blood in stool.  Genitourinary: Positive for frequency. Negative for dysuria and urgency.  Musculoskeletal: Positive for myalgias and back pain.  Neurological: Negative for dizziness and tingling.  Psychiatric/Behavioral:  Negative for depression.   Physical Exam   Blood pressure 131/56, pulse 80, temperature 97.7 F (36.5 C), temperature source Oral, resp. rate 16, height  (1.6 m), weight 115.724 kg (255 lb 2 oz), last menstrual period 08/05/2014.  Physical Exam  Nursing note and vitals reviewed. Constitutional: She is oriented to person, place, and time. She appears well-developed and well-nourished.  GI: Soft.  Genitourinary:  Pelvic exam: Ext gen- nl anatomy, skin intact Vagina- scant bloody mucoid discharge Cx- closed, long, firm Uterus- unable to palpate due to body habitus Adn- non tender  Neg CVA tenderness  Musculoskeletal: Normal range of motion.  Neurological: She is alert and oriented to person, place, and time.  Skin: Skin is warm and dry.  Psychiatric: She has a normal mood and affect. Her behavior is normal.    MAU Course  Procedures Results for orders placed or performed during the hospital encounter of 10/17/14 (from the past 24 hour(s))  Urinalysis, Routine w reflex microscopic (not at [facility])     Status: Abnormal   Collection Time: 10/17/14  2:10 PM  Result Value Ref Range   Color, Urine YELLOW YELLOW   APPearance CLEAR CLEAR   Specific Gravity, Urine 1.020 1.005 - 1.030   pH 6.0 5.0 - 8.0   Glucose, UA NEGATIVE NEGATIVE mg/dL   Hgb urine dipstick MODERATE (A) NEGATIVE   Bilirubin Urine NEGATIVE NEGATIVE   Ketones, ur NEGATIVE NEGATIVE mg/dL   Protein, ur NEGATIVE NEGATIVE mg/dL   Urobilinogen, UA 0.2 0.0 - 1.0 mg/dL   Nitrite NEGATIVE NEGATIVE   Leukocytes, UA NEGATIVE NEGATIVE  Urine microscopic-add on     Status: Abnormal   Collection Time: 10/17/14  2:10 PM  Result Value Ref Range   Squamous Epithelial / LPF FEW (A) RARE   WBC, UA 3-6 <3 WBC/hpf   RBC / HPF 0-2 <3 RBC/hpf   Bacteria, UA RARE RARE   Urine-Other MUCOUS PRESENT   Pregnancy, urine POC     Status: Abnormal   Collection Time: 10/17/14  2:18 PM  Result Value Ref Range   Preg Test, Ur POSITIVE  (A) NEGATIVE  hCG, quantitative, pregnancy     Status: Abnormal   Collection Time: 10/17/14  2:42 PM  Result Value Ref Range   hCG, Beta Chain, Quant, S [phone] (H) <5 mIU/mL  CBC     Status: Abnormal   Collection Time: 10/17/14  2:42 PM  Result Value Ref Range   WBC 15.7 (H) 4.0 - 10.5 K/uL   RBC 4.57 3.87 - 5.11 MIL/uL   Hemoglobin 11.4 (L) 12.0 - 15.0 g/dL   HCT 60.4 (L) 54.0 - 98.1 %  MCV 76.1 (L) 78.0 - 100.0 fL   MCH 24.9 (L) 26.0 - 34.0 pg   MCHC 32.8 30.0 - 36.0 g/dL   RDW 16.1 (H) 09.6 - 04.5 %   Platelets 287 150 - 400 K/uL  ABO/Rh     Status: None (Preliminary result)   Collection Time: 10/17/14  2:42 PM  Result Value Ref Range   ABO/RH(D) O POS   Wet prep, genital     Status: Abnormal   Collection Time: 10/17/14  3:20 PM  Result Value Ref Range   Yeast Wet Prep HPF POC NONE SEEN NONE SEEN   Trich, Wet Prep NONE SEEN NONE SEEN   Clue Cells Wet Prep HPF POC NONE SEEN NONE SEEN   WBC, Wet Prep HPF POC MODERATE (A) NONE SEEN    US Ob Comp Less 14 Wks  10/17/2014   CLINICAL DATA:  Patient is first trimester pregnancy and spotting for 2 days  EXAM: OBSTETRIC <14 WK [place] AND TRANSVAGINAL OB US  TECHNIQUE: Both transabdominal and transvaginal ultrasound examinations were performed for complete evaluation of the gestation as well as the maternal uterus, adnexal regions, and pelvic cul-de-sac. Transvaginal technique was performed to assess early pregnancy.  COMPARISON:  None.  FINDINGS: Intrauterine gestational sac: Visualized/normal in shape.  Yolk sac:  Not visualized  Embryo:  Appears normal  Cardiac Activity: Visualized  Heart Rate: Between 178 in 203  bpm  CRL:  41  mm   11 w   0 d                  [place] EDC: 05/08/2015  Maternal uterus/adnexae: Small subchorionic hemorrhage. Right ovary not seen. Left ovary normal with corpus luteum noted. No free fluid.  IMPRESSION: Live intrauterine gestation with small subchorionic hemorrhage   Electronically Signed   By: Esperanza Heir M.D.   On:  10/17/2014 16:50   US Ob Transvaginal  10/17/2014   CLINICAL DATA:  Patient is first trimester pregnancy and spotting for 2 days  EXAM: OBSTETRIC <14 WK [place] AND TRANSVAGINAL OB US  TECHNIQUE: Both transabdominal and transvaginal ultrasound examinations were performed for complete evaluation of the gestation as well as the maternal uterus, adnexal regions, and pelvic cul-de-sac. Transvaginal technique was performed to assess early pregnancy.  COMPARISON:  None.  FINDINGS: Intrauterine gestational sac: Visualized/normal in shape.  Yolk sac:  Not visualized  Embryo:  Appears normal  Cardiac Activity: Visualized  Heart Rate: Between 178 in 203  bpm  CRL:  41  mm   11 w   0 d                  [place] EDC: 05/08/2015  Maternal uterus/adnexae: Small subchorionic hemorrhage. Right ovary not seen. Left ovary normal with corpus luteum noted. No free fluid.  IMPRESSION: Live intrauterine gestation with small subchorionic hemorrhage   Electronically Signed   By: Esperanza Heir M.D.   On: 10/17/2014 16:50    MDM   Assessment and Plan  [redacted] wks EGA by U/S, small subchorionic hemorrhage We discussed possibility of kidney stones, no RBC on micro, pt is not that uncomfortable appearing Increase fluids, C&S on urine Constipation-management reviewed DM off her meds-start back on her Metformin Multiple physical c/o- hx fibromyalgia Plans [facility] in [place] [facility] when gets her Rinaldo Cloud. 10/17/2014, 3:28 PM

## 2014-10-17 NOTE — MAU Note (Signed)
Pt states past 2 days has had spotting however this am saw small long clot. Having pain in low back radiating into lower abdomen and down front of legs. Pain is constant. Denies burning with voiding. Is constipated.

## 2014-10-17 NOTE — Discharge Instructions (Signed)
Your ultrasound shows you are [redacted] wks pregnant.Hematoma subcorinico (Subchorionic Hematoma) Un hematoma subcorinico es una acumulacin de sangre entre la pared externa de la placenta y la pared interna del la matriz (tero). La placenta es el rgano que conecta el feto a la pared del tero. La placenta realiza la funcin de alimentacin, respiracin (oxgeno al feto) y el trabajo de eliminacin de desechos (excrecin) del feto.  Un hematoma subcorinico es la anormalidad ms frecuente encontrada en una ecografa durante el primer trimestre o principios del segundo trimestre del embarazo. Si ha habido poca o ninguna hemorragia vaginal, generalmente los pequeos hematomas se reducen por su propia cuenta y no afectan al beb ni al Lisa Moreno. La sangre es absorbida gradualmente durante una o [place]. Cuando la hemorragia comienza ms tarde en el embarazo o el hematoma es ms grande o se produce en una paciente de edad avanzada, el resultado puede no ser tan bueno. Los grandes hematomas pueden agrandarse an ms y [place] las posibilidades de aborto espontneo. El hematoma subcorinico tambin aumenta el riesgo de desprendimiento precoz de la placenta del tero, muerte fetal y Sport and exercise psychologist. INSTRUCCIONES PARA EL CUIDADO EN EL HOGAR   Repose en cama si el mdico se lo recomienda. Aunque el reposo en cama no evitar la hemorragia o un aborto espontneo, su mdico puede recomendarlo.  Evite levantar objetos pesados (ms de 10 libras [4,5 kg]), hacer ejercicio, tener relaciones sexuales o realizar duchas vaginales segn se lo indique el profesional.  Lleve un registro de la cantidad y Hydrographic surveyor de remojo (saturacin) de las toallas higinicas que Landscape architect. [place]ote esta informacin.  No use tampones.  Cumpla con todas las visitas de control, segn le indique su mdico. El profesional podr pedirle que se realice anlisis de seguimiento, pruebas de [place] o [place]. SOLICITE ATENCIN MDICA DE  INMEDIATO SI:   Siente calambres intensos en el estmago, en la espalda, en el abdomen o en la pelvis.  Tiene fiebre.  Elimina cogulos o tejidos grandes. Guarde los tejidos para que su mdico los vea.  Si la hemorragia aumenta o siente mareos, debilidad o tiene episodios de [place]. Document Released: 07/27/2008 Document Revised: 01/29/2013 [facility] Patient Information 2015 [place], [place]. This information is not intended to replace advice given to you by your health care provider. Make sure you discuss any questions you have with your health care provider.

## 2014-10-18 LAB — HIV ANTIBODY (ROUTINE TESTING W REFLEX): HIV Screen 4th Generation wRfx: NONREACTIVE

## 2014-10-19 LAB — GC/CHLAMYDIA PROBE AMP (~~LOC~~) NOT AT ARMC
Chlamydia: NEGATIVE
Neisseria Gonorrhea: NEGATIVE

## 2014-11-02 ENCOUNTER — Encounter: Payer: Medicaid Other | Attending: Obstetrics & Gynecology | Admitting: *Deleted

## 2014-11-02 ENCOUNTER — Ambulatory Visit: Payer: Self-pay | Admitting: *Deleted

## 2014-11-02 VITALS — Ht 64.0 in | Wt 252.9 lb

## 2014-11-02 DIAGNOSIS — Z713 Dietary counseling and surveillance: Secondary | ICD-10-CM | POA: Diagnosis not present

## 2014-11-02 DIAGNOSIS — Z3A00 Weeks of gestation of pregnancy not specified: Secondary | ICD-10-CM | POA: Insufficient documentation

## 2014-11-02 DIAGNOSIS — O24112 Pre-existing diabetes mellitus, type 2, in pregnancy, second trimester: Secondary | ICD-10-CM | POA: Insufficient documentation

## 2014-11-02 DIAGNOSIS — Z6841 Body Mass Index (BMI) 40.0 and over, adult: Secondary | ICD-10-CM | POA: Insufficient documentation

## 2014-11-02 NOTE — Progress Notes (Signed)
  Patient was seen on 11/02/14 for Gestational Diabetes self-management .    The following learning objectives were met by the patient :   States the definition of Gestational Diabetes  States when to check blood glucose levels  Demonstrates proper blood glucose monitoring techniques  States the effect of stress and exercise on blood glucose levels  States the importance of limiting caffeine and abstaining from alcohol and smoking  Plan:  Consider  increasing your activity level by walking daily as tolerated Begin checking BG before breakfast and 2 hours after first bit of breakfast, lunch and dinner after  as directed by MD  Take medication  as directed by MD  Patient has a TrueTest glucometer with 2 remaining test strips. She does not have insurance or finances to obtain. A new glucometer was dispensed along with supplies. Blood glucose monitor given: [employer] Lot # [id number] Exp: 2016/11/21 Blood glucose reading: 18m/dl 2hpp  Patient instructed to monitor glucose levels: FBS: 60 - <90 2 hour: <120  Patient received the following handouts:  Nutrition Diabetes and Pregnancy  Patient will be seen for follow-up as needed.

## 2014-11-02 NOTE — Progress Notes (Signed)
Nutrition note: DM diet education Pt had GDM with previous pregnancy & then was diagnosed with Type 2 DM last year. Pt was taking metformin but stopped it in April. Pt has lost 12.1# @ 6169w6d, which pt reports is due to N&V as well as getting full fast & heartburn. Pt reports eating 3 meals & 3 snacks/d (pt reports eating q 2hrs). Pt has not started a PNV yet. Pt reports no walking or physical activity. Pt received verbal & written education in Spanish (speaks & understands English but prefers written in Spanish) on DM diet during pregnancy. Encouraged pt to start taking a PNV. Discussed tips to decrease N&V. Discussed wt gain goals of 11-20# or 0.5#/wk in 2nd & 3rd trimester. Pt agrees to follow DM diet with 3 meals & 1-3 snacks/d with proper CHO/ protein combination.  Pt has WIC & plans to BF. F/u in 2-4 wks Blondell RevealLaura Angello Chien, MS, RD, LDN, [facility]

## 2014-11-09 ENCOUNTER — Ambulatory Visit (INDEPENDENT_AMBULATORY_CARE_PROVIDER_SITE_OTHER): Payer: Medicaid Other | Admitting: Obstetrics & Gynecology

## 2014-11-09 ENCOUNTER — Encounter: Payer: Self-pay | Admitting: Obstetrics & Gynecology

## 2014-11-09 DIAGNOSIS — Z124 Encounter for screening for malignant neoplasm of cervix: Secondary | ICD-10-CM

## 2014-11-09 DIAGNOSIS — O24112 Pre-existing diabetes mellitus, type 2, in pregnancy, second trimester: Secondary | ICD-10-CM

## 2014-11-09 DIAGNOSIS — R000 Tachycardia, unspecified: Secondary | ICD-10-CM

## 2014-11-09 DIAGNOSIS — K5900 Constipation, unspecified: Secondary | ICD-10-CM

## 2014-11-09 DIAGNOSIS — O169 Unspecified maternal hypertension, unspecified trimester: Secondary | ICD-10-CM | POA: Insufficient documentation

## 2014-11-09 DIAGNOSIS — O162 Unspecified maternal hypertension, second trimester: Secondary | ICD-10-CM

## 2014-11-09 DIAGNOSIS — Z118 Encounter for screening for other infectious and parasitic diseases: Secondary | ICD-10-CM | POA: Diagnosis not present

## 2014-11-09 DIAGNOSIS — Z1151 Encounter for screening for human papillomavirus (HPV): Secondary | ICD-10-CM

## 2014-11-09 DIAGNOSIS — Z113 Encounter for screening for infections with a predominantly sexual mode of transmission: Secondary | ICD-10-CM | POA: Diagnosis not present

## 2014-11-09 DIAGNOSIS — E119 Type 2 diabetes mellitus without complications: Secondary | ICD-10-CM

## 2014-11-09 HISTORY — DX: Tachycardia, unspecified: R00.0

## 2014-11-09 HISTORY — DX: Pre-existing type 2 diabetes mellitus, in pregnancy, second trimester: O24.112

## 2014-11-09 HISTORY — DX: Unspecified maternal hypertension, unspecified trimester: O16.9

## 2014-11-09 LAB — POCT URINALYSIS DIP (DEVICE)
BILIRUBIN URINE: NEGATIVE
Glucose, UA: NEGATIVE mg/dL
HGB URINE DIPSTICK: NEGATIVE
Ketones, ur: 15 mg/dL — AB
Leukocytes, UA: NEGATIVE
NITRITE: NEGATIVE
Protein, ur: NEGATIVE mg/dL
SPECIFIC GRAVITY, URINE: 1.025 (ref 1.005–1.030)
Urobilinogen, UA: 0.2 mg/dL (ref 0.0–1.0)
pH: 6 (ref 5.0–8.0)

## 2014-11-09 LAB — OB RESULTS CONSOLE GC/CHLAMYDIA: Gonorrhea: NEGATIVE

## 2014-11-09 MED ORDER — POLYETHYLENE GLYCOL 3350 17 GM/SCOOP PO POWD: 17.0000 g | Freq: Every day | ORAL | Status: DC

## 2014-11-09 MED ORDER — METFORMIN HCL 1000 MG PO TABS: 1000.0000 mg | ORAL_TABLET | Freq: Every day | ORAL | Status: DC

## 2014-11-09 MED ORDER — ASPIRIN 81 MG PO TABS: 81.0000 mg | ORAL_TABLET | Freq: Every day | ORAL | Status: DC

## 2014-11-09 NOTE — Progress Notes (Signed)
Ultrasound scheduled for 12/09/2014@0900 . Fetal echo with [facility] scheduled for 12/17/2014 @ 10:30AM. Advised to bring $100 for deposit day of appointment.

## 2014-11-09 NOTE — Progress Notes (Signed)
   Subjective:    Lisa Moreno is a G2P1001 2036w2d being seen today for her first obstetrical visit.  Her obstetrical history is significant for advanced maternal age, obesity and type 2 DM, hypertension, constipation, nausa, back pain. Patient does intend to breast feed. Pregnancy history fully reviewed.  Patient reports backache and nausea.  Filed Vitals:   11/09/14 0809  BP: 133/67  Pulse: 73  Temp: 98.5 F (36.9 C)  Weight: 251 lb 11.2 oz (114.17 kg)    HISTORY: OB History  Gravida Para Term Preterm AB SAB TAB Ectopic Multiple Living  2 1 1  0 0 0 0 0 0 1    # Outcome Date GA Lbr Len/2nd Weight Sex Delivery Anes PTL Lv  2 Current           1 Term 05/25/06 719w0d  7 lb 1 oz (3.204 kg) M Vag-Spont EPI N Y     Complications: Gestational diabetes     Past Medical History  Diagnosis Date  . Asthma   . Hypertension   . Diabetes mellitus without complication   . GERD (gastroesophageal reflux disease)    Past Surgical History  Procedure Laterality Date  . Breast reduction surgery    . Tonsillectomy     Family History  Problem Relation Age of Onset  . Diabetes Mother   . Stroke Mother   . Hypertension Mother   . Depression Mother   . Diabetes Father   . Stroke Father   . Kidney disease Father      Exam    Uterus:     Pelvic Exam:    Perineum: No Hemorrhoids   Vulva: normal   Vagina:  thin grey discharge   pH: n/a   Cervix: no lesions   Adnexa: normal adnexa   Bony Pelvis: average  System: Breast:  pt refused examx   Skin: normal coloration and turgor, no rashes    Neurologic: oriented, normal mood   Extremities: no deformities   HEENT sclera clear, anicteric, oropharynx clear, no lesions, neck supple with midline trachea and thyroid without masses   Mouth/Teeth mucous membranes moist, pharynx normal without lesions and dental hygiene good   Neck supple and no masses   Cardiovascular: regular rate and rhythm   Respiratory:  appears well,  vitals normal, no respiratory distress, acyanotic, normal RR, chest clear, no wheezing, crepitations, rhonchi, normal symmetric air entry   Abdomen: soft, non-tender; bowel sounds normal; no masses,  no organomegaly   Urinary: urethral meatus normal      Assessment:    Pregnancy: G2P1001 Patient Active Problem List   Diagnosis Date Noted  . Pregnancy with type 2 diabetes mellitus in second trimester 11/09/2014  . Hypertension affecting pregnancy, antepartum 11/09/2014  . Type 2 diabetes mellitus without complication 03/30/2014  . Morbid obesity 03/30/2014  . Gastroesophageal reflux disease without esophagitis 03/30/2014  . Fibromyalgia 01/08/2013        Plan:     Initial labs drawn. Prenatal vitamins. Problem list reviewed and updated. Genetic Screening discussed -- NIPS  Ultrasound discussed; fetal survey: requested. Pap   Follow up in 1 weeks.   Type 2 DM Increase metformin to 1000mg  qhs 24 hour urine TSH nml in December Optho exam Fetal echo  HTN 24 hour urine  Racing heart beat Cards referral  Vaginal odor Wet prep  Constipation Miralax 1-2 times/day titrated  Lisa Manganelli H. 11/09/2014

## 2014-11-09 NOTE — Progress Notes (Signed)
Okey RegalCarol used for interpreter Reports "stomach irritation/pain" Reports urinating at least once an hour

## 2014-11-09 NOTE — Addendum Note (Signed)
Addended by: Rockwell GermanyFIELDS, Sharmane Dame A on: 11/09/2014 01:59 PM   Modules accepted: Orders

## 2014-11-09 NOTE — Progress Notes (Addendum)
Cardiology appointment scheduled for 12/09/2014 @ 10:15AM List of dental resources  Given.  [facility]. Information faxed.

## 2014-11-10 LAB — PRENATAL PROFILE (SOLSTAS)
ANTIBODY SCREEN: NEGATIVE
BASOS PCT: 0 % (ref 0–1)
Basophils Absolute: 0 10*3/uL (ref 0.0–0.1)
Eosinophils Absolute: 0.3 10*3/uL (ref 0.0–0.7)
Eosinophils Relative: 2 % (ref 0–5)
HCT: 34.1 % — ABNORMAL LOW (ref 36.0–46.0)
HIV 1&2 Ab, 4th Generation: NONREACTIVE
Hemoglobin: 11.1 g/dL — ABNORMAL LOW (ref 12.0–15.0)
Hepatitis B Surface Ag: NEGATIVE
LYMPHS ABS: 3.1 10*3/uL (ref 0.7–4.0)
Lymphocytes Relative: 20 % (ref 12–46)
MCH: 24.7 pg — ABNORMAL LOW (ref 26.0–34.0)
MCHC: 32.6 g/dL (ref 30.0–36.0)
MCV: 75.8 fL — ABNORMAL LOW (ref 78.0–100.0)
MONOS PCT: 4 % (ref 3–12)
MPV: 10.5 fL (ref 8.6–12.4)
Monocytes Absolute: 0.6 10*3/uL (ref 0.1–1.0)
NEUTROS PCT: 74 % (ref 43–77)
Neutro Abs: 11.3 10*3/uL — ABNORMAL HIGH (ref 1.7–7.7)
Platelets: 273 10*3/uL (ref 150–400)
RBC: 4.5 MIL/uL (ref 3.87–5.11)
RDW: 16 % — ABNORMAL HIGH (ref 11.5–15.5)
RH TYPE: POSITIVE
Rubella: 2.96 Index — ABNORMAL HIGH (ref <0.90)
WBC: 15.3 10*3/uL — ABNORMAL HIGH (ref 4.0–10.5)

## 2014-11-10 LAB — WET PREP, GENITAL
TRICH WET PREP: NONE SEEN
Yeast Wet Prep HPF POC: NONE SEEN

## 2014-11-10 LAB — CYTOLOGY - PAP

## 2014-11-10 LAB — PROTEIN / CREATININE RATIO, URINE
Creatinine, Urine: 161.1 mg/dL
PROTEIN CREATININE RATIO: 0.12 (ref <0.15)
TOTAL PROTEIN, URINE: 20 mg/dL (ref 5–24)

## 2014-11-11 LAB — CBC
HCT: 34.8 % — ABNORMAL LOW (ref 36.0–46.0)
HEMOGLOBIN: 11.5 g/dL — AB (ref 12.0–15.0)
MCH: 24.8 pg — ABNORMAL LOW (ref 26.0–34.0)
MCHC: 33 g/dL (ref 30.0–36.0)
MCV: 75.2 fL — ABNORMAL LOW (ref 78.0–100.0)
MPV: 10.1 fL (ref 8.6–12.4)
Platelets: 270 10*3/uL (ref 150–400)
RBC: 4.63 MIL/uL (ref 3.87–5.11)
RDW: 16.3 % — ABNORMAL HIGH (ref 11.5–15.5)
WBC: 14.3 10*3/uL — ABNORMAL HIGH (ref 4.0–10.5)

## 2014-11-11 LAB — COMPREHENSIVE METABOLIC PANEL
ALBUMIN: 3.5 g/dL (ref 3.5–5.2)
ALK PHOS: 62 U/L (ref 39–117)
ALT: 15 U/L (ref 0–35)
AST: 13 U/L (ref 0–37)
BILIRUBIN TOTAL: 0.3 mg/dL (ref 0.2–1.2)
BUN: 9 mg/dL (ref 6–23)
CHLORIDE: 102 meq/L (ref 96–112)
CO2: 23 mEq/L (ref 19–32)
Calcium: 9.2 mg/dL (ref 8.4–10.5)
Creat: 0.43 mg/dL — ABNORMAL LOW (ref 0.50–1.10)
Glucose, Bld: 96 mg/dL (ref 70–99)
Potassium: 4 mEq/L (ref 3.5–5.3)
SODIUM: 137 meq/L (ref 135–145)
Total Protein: 6.9 g/dL (ref 6.0–8.3)

## 2014-11-11 LAB — HEMOGLOBINOPATHY EVALUATION
HEMOGLOBIN OTHER: 0 %
HGB A2 QUANT: 2.2 % (ref 2.2–3.2)
Hgb A: 97.8 % (ref 96.8–97.8)
Hgb F Quant: 0 % (ref 0.0–2.0)
Hgb S Quant: 0 %

## 2014-11-11 NOTE — Addendum Note (Signed)
Addended by: Faythe CasaBELLAMY, Kaileah Shevchenko M on: 11/11/2014 12:11 PM   Modules accepted: Orders

## 2014-11-12 ENCOUNTER — Encounter (HOSPITAL_COMMUNITY): Payer: Self-pay | Admitting: *Deleted

## 2014-11-12 ENCOUNTER — Other Ambulatory Visit: Payer: Self-pay

## 2014-11-12 ENCOUNTER — Inpatient Hospital Stay (HOSPITAL_COMMUNITY)
Admission: AD | Admit: 2014-11-12 | Discharge: 2014-11-12 | Disposition: A | Discharge status: Home / Self Care | Payer: Medicaid Other | Source: Ambulatory Visit | Attending: Family Medicine | Admitting: Family Medicine

## 2014-11-12 DIAGNOSIS — O162 Unspecified maternal hypertension, second trimester: Secondary | ICD-10-CM | POA: Insufficient documentation

## 2014-11-12 DIAGNOSIS — Z7982 Long term (current) use of aspirin: Secondary | ICD-10-CM | POA: Diagnosis not present

## 2014-11-12 DIAGNOSIS — R0789 Other chest pain: Secondary | ICD-10-CM

## 2014-11-12 DIAGNOSIS — R12 Heartburn: Secondary | ICD-10-CM | POA: Insufficient documentation

## 2014-11-12 DIAGNOSIS — Z3A14 14 weeks gestation of pregnancy: Secondary | ICD-10-CM | POA: Diagnosis not present

## 2014-11-12 DIAGNOSIS — O26892 Other specified pregnancy related conditions, second trimester: Secondary | ICD-10-CM | POA: Diagnosis not present

## 2014-11-12 DIAGNOSIS — R079 Chest pain, unspecified: Secondary | ICD-10-CM | POA: Diagnosis present

## 2014-11-12 LAB — PROTEIN, URINE, 24 HOUR
Protein, 24H Urine: 11 mg/d
Protein, Urine: 9 mg/dL (ref 5–24)

## 2014-11-12 LAB — URINE MICROSCOPIC-ADD ON

## 2014-11-12 LAB — CREATININE CLEARANCE, URINE, 24 HOUR
CREATININE: 0.43 mg/dL — AB (ref 0.50–1.10)
Creatinine Clearance: 16 mL/min — ABNORMAL LOW (ref 75–115)
Creatinine, 24H Ur: 98 mg/d — ABNORMAL LOW (ref 700–1800)
Creatinine, Urine: 81.5 mg/dL

## 2014-11-12 LAB — URINALYSIS, ROUTINE W REFLEX MICROSCOPIC
Bilirubin Urine: NEGATIVE
Glucose, UA: NEGATIVE mg/dL
Hgb urine dipstick: NEGATIVE
Ketones, ur: 15 mg/dL — AB
Nitrite: NEGATIVE
Protein, ur: NEGATIVE mg/dL
Specific Gravity, Urine: 1.01 (ref 1.005–1.030)
Urobilinogen, UA: 0.2 mg/dL (ref 0.0–1.0)
pH: 6 (ref 5.0–8.0)

## 2014-11-12 MED ORDER — DEXAMETHASONE SODIUM PHOSPHATE 10 MG/ML IJ SOLN
10.0000 mg | Freq: Once | INTRAMUSCULAR | Status: AC
  Administered 2014-11-12: 10 mg via INTRAVENOUS
  Filled 2014-11-12: qty 1

## 2014-11-12 MED ORDER — METOCLOPRAMIDE HCL 5 MG/ML IJ SOLN
10.0000 mg | Freq: Once | INTRAMUSCULAR | Status: AC
  Administered 2014-11-12: 10 mg via INTRAVENOUS
  Filled 2014-11-12: qty 2

## 2014-11-12 MED ORDER — DIPHENHYDRAMINE HCL 50 MG/ML IJ SOLN
25.0000 mg | Freq: Once | INTRAMUSCULAR | Status: AC
Start: 2014-11-12 — End: 2014-11-12
  Administered 2014-11-12: 25 mg via INTRAVENOUS
  Filled 2014-11-12: qty 1

## 2014-11-12 MED ORDER — GI COCKTAIL ~~LOC~~
30.0000 mL | Freq: Once | ORAL | Status: DC
  Filled 2014-11-12: qty 30

## 2014-11-12 MED ORDER — SODIUM CHLORIDE 0.9 % IV BOLUS (SEPSIS)
500.0000 mL | Freq: Once | INTRAVENOUS | Status: AC
  Administered 2014-11-12: 500 mL via INTRAVENOUS

## 2014-11-12 MED ORDER — DIPHENHYDRAMINE HCL 50 MG/ML IJ SOLN: 25.0000 mg | INTRAMUSCULAR | Status: DC

## 2014-11-12 MED ORDER — RANITIDINE HCL 150 MG PO TABS: 150.0000 mg | ORAL_TABLET | Freq: Two times a day (BID) | ORAL | Status: DC

## 2014-11-12 NOTE — MAU Note (Signed)
Pt states after pushing decadron of intense vaginal and rectal burning.  L. Leftwhich-Kerby, CNM called to bedside.  CNM observed vaginal area.  Burning sensation lasted about 4 minutes and then eased off.  Doppled FHR.

## 2014-11-12 NOTE — Discharge Instructions (Signed)
Heartburn During Pregnancy  Heartburn is a burning sensation in the chest caused by stomach acid backing up into the esophagus. Heartburn is common in pregnancy because a certain hormone (progesterone) is released when a woman is pregnant. The progesterone hormone may relax the valve that separates the esophagus from the stomach. This allows acid to go up into the esophagus, causing heartburn. Heartburn may also happen in pregnancy because the enlarging uterus pushes up on the stomach, which pushes more acid into the esophagus. This is especially true in the later stages of pregnancy. Heartburn problems usually go away after giving birth. CAUSES  Heartburn is caused by stomach acid backing up into the esophagus. During pregnancy, this may result from various things, including:   The progesterone hormone.  Changing hormone levels.  The growing uterus pushing stomach acid upward.  Large meals.  Certain foods and drinks.  Exercise.  Increased acid production. SIGNS AND SYMPTOMS   Burning pain in the chest or lower throat.  Bitter taste in the mouth.  Coughing. DIAGNOSIS  Your health care provider will typically diagnose heartburn by taking a careful history of your concern. Blood tests may be done to check for a certain type of bacteria that is associated with heartburn. Sometimes, heartburn is diagnosed by prescribing a heartburn medicine to see if the symptoms improve. In some cases, a procedure called an endoscopy may be done. In this procedure, a tube with a light and a camera on the end (endoscope) is used to examine the esophagus and the stomach. TREATMENT  Treatment will vary depending on the severity of your symptoms. Your health care provider may recommend:  Over-the-counter medicines (antacids, acid reducers) for mild heartburn.  Prescription medicines to decrease stomach acid or to protect your stomach lining.  Certain changes in your diet.  Elevating the head of your bed  by putting blocks under the legs. This helps prevent stomach acid from backing up into the esophagus when you are lying down. HOME CARE INSTRUCTIONS   Only take over-the-counter or prescription medicines as directed by your health care provider.  Raise the head of your bed by putting blocks under the legs if instructed to do so by your health care provider. Sleeping with more pillows is not effective because it only changes the position of your head.  Do not exercise right after eating.  Avoid eating 2-3 hours before bed. Do not lie down right after eating.  Eat small meals throughout the day instead of three large meals.  Identify foods and beverages that make your symptoms worse and avoid them. Foods you may want to avoid include:  Peppers.  Chocolate.  High-fat foods, including fried foods.  Spicy foods.  Garlic and onions.  Citrus fruits, including oranges, grapefruit, lemons, and limes.  Food containing tomatoes or tomato products.  Mint.  Carbonated and caffeinated drinks.  Vinegar. SEEK MEDICAL CARE IF:  You have abdominal pain of any kind.  You feel burning in your upper abdomen or chest, especially after eating or lying down.  You have nausea and vomiting.  Your stomach feels upset after you eat. SEEK IMMEDIATE MEDICAL CARE IF:   You have severe chest pain that goes down your arm or into your jaw or neck.  You feel sweaty, dizzy, or light-headed.  You become short of breath.  You vomit blood.  You have difficulty or pain with swallowing.  You have bloody or black, tarry stools.  You have episodes of heartburn more than 3 times a  week, for more than 2 weeks. MAKE SURE YOU:  Understand these instructions.  Will watch your condition.  Will get help right away if you are not doing well or get worse. Document Released: 04/07/2000 Document Revised: 04/15/2013 Document Reviewed: 11/27/2012 [facility] Patient Information 2015 [place], [place]. This  information is not intended to replace advice given to you by your health care provider. Make sure you discuss any questions you have with your health care provider.  Hypertension During Pregnancy Hypertension, or high blood pressure, is when there is extra pressure inside your blood vessels that carry blood from the heart to the rest of your body (arteries). It can happen at any time in life, including pregnancy. Hypertension during pregnancy can cause problems for you and your baby. Your baby might not weigh as much as he or she should at birth or might be born early (premature). Very bad cases of hypertension during pregnancy can be life-threatening.  Different types of hypertension can occur during pregnancy. These include:  Chronic hypertension. This happens when a woman has hypertension before pregnancy and it continues during pregnancy.  Gestational hypertension. This is when hypertension develops during pregnancy.  Preeclampsia or toxemia of pregnancy. This is a very serious type of hypertension that develops only during pregnancy. It affects the whole body and can be very dangerous for both mother and baby.  Gestational hypertension and preeclampsia usually go away after your baby is born. Your blood pressure will likely stabilize within 6 weeks. Women who have hypertension during pregnancy have a greater chance of developing hypertension later in life or with future pregnancies. RISK FACTORS There are certain factors that make it more likely for you to develop hypertension during pregnancy. These include:  Having hypertension before pregnancy.  Having hypertension during a previous pregnancy.  Being overweight.  Being older than 40 years.  Being pregnant with more than one baby.  Having diabetes or kidney problems. SIGNS AND SYMPTOMS Chronic and gestational hypertension rarely cause symptoms. Preeclampsia has symptoms, which may include:  Increased protein in your urine. Your  health care provider will check for this at every prenatal visit.  Swelling of your hands and face.  Rapid weight gain.  Headaches.  Visual changes.  Being bothered by light.  Abdominal pain, especially in the upper right area.  Chest pain.  Shortness of breath.  Increased reflexes.  Seizures. These occur with a more severe form of preeclampsia, called eclampsia. DIAGNOSIS  You may be diagnosed with hypertension during a regular prenatal exam. At each prenatal visit, you may have:  Your blood pressure checked.  A urine test to check for protein in your urine. The type of hypertension you are diagnosed with depends on when you developed it. It also depends on your specific blood pressure reading.  Developing hypertension before 20 weeks of pregnancy is consistent with chronic hypertension.  Developing hypertension after 20 weeks of pregnancy is consistent with gestational hypertension.  Hypertension with increased urinary protein is diagnosed as preeclampsia.  Blood pressure measurements that stay above 160 systolic or 110 diastolic are a sign of severe preeclampsia. TREATMENT Treatment for hypertension during pregnancy varies. Treatment depends on the type of hypertension and how serious it is.  If you take medicine for chronic hypertension, you may need to switch medicines.  Medicines called ACE inhibitors should not be taken during pregnancy.  Low-dose aspirin may be suggested for women who have risk factors for preeclampsia.  If you have gestational hypertension, you may need to take  a blood pressure medicine that is safe during pregnancy. Your health care provider will recommend the correct medicine.  If you have severe preeclampsia, you may need to be in the hospital. Health care providers will watch you and your baby very closely. You also may need to take medicine called magnesium sulfate to prevent seizures and lower blood pressure.  Sometimes, an early  delivery is needed. This may be the case if the condition worsens. It would be done to protect you and your baby. The only cure for preeclampsia is delivery.  Your health care provider may recommend that you take one low-dose aspirin (81 mg) each day to help prevent high blood pressure during your pregnancy if you are at risk for preeclampsia. You may be at risk for preeclampsia if:  You had preeclampsia or eclampsia during a previous pregnancy.  Your baby did not grow as expected during a previous pregnancy.  You experienced preterm birth with a previous pregnancy.  You experienced a separation of the placenta from the uterus (placental abruption) during a previous pregnancy.  You experienced the loss of your baby during a previous pregnancy.  You are pregnant with more than one baby.  You have other medical conditions, such as diabetes or an autoimmune disease. HOME CARE INSTRUCTIONS  Schedule and keep all of your regular prenatal care appointments. This is important.  Take medicines only as directed by your health care provider. Tell your health care provider about all medicines you take.  Eat as little salt as possible.  Get regular exercise.  Do not drink alcohol.  Do not use tobacco products.  Do not drink products with caffeine.  Lie on your left side when resting. SEEK IMMEDIATE MEDICAL CARE IF:  You have severe abdominal pain.  You have sudden swelling in your hands, ankles, or face.  You gain 4 pounds (1.8 kg) or more in 1 week.  You vomit repeatedly.  You have vaginal bleeding.  You do not feel your baby moving as much.  You have a headache.  You have blurred or double vision.  You have muscle twitching or spasms.  You have shortness of breath.  You have blue fingernails or lips.  You have blood in your urine. MAKE SURE YOU:  Understand these instructions.  Will watch your condition.  Will get help right away if you are not doing well or get  worse. Document Released: 12/27/2010 Document Revised: 08/25/2013 Document Reviewed: 11/07/2012 [facility] Patient Information 2015 [place], [place]. This information is not intended to replace advice given to you by your health care provider. Make sure you discuss any questions you have with your health care provider.

## 2014-11-12 NOTE — MAU Note (Signed)
Pt c/o headache for the past two days and reports chest pain for the past 2hrs that starts in center of chest and goes out to right armpit. Felt nauseated since the chest pain began and feels breaths are restricted when trying to take a deep breath.

## 2014-11-12 NOTE — MAU Provider Note (Signed)
Chief Complaint: Chest Pain   First Provider Initiated Contact with Patient 11/12/14 2022      SUBJECTIVE HPI: Lisa Moreno is a 39 y.o. G2P1001 at [redacted]w[redacted]d by LMP who presents to maternity admissions reporting onset of chest pain ~4 hours ago.  She describes the pain as tightening, severe, in her upper chest, and radiating into her right arm.  She was seen for initial OB visit in [facility] on 11/09/14 and was referred to cardiology for recent mild chest pain and palpitations.  She has appointment in 1 month with cardiologist.  She reports that today's pain is different, more intense, and radiates to her arm, which has never happened before. She does report eating ravioli with tomato sauce 2 hours prior to onset of the pain and does report a history of acid reflux.   She denies vaginal bleeding, vaginal itching/burning, urinary symptoms, h/a, dizziness, n/v, or fever/chills.     Chest Pain  This is a new problem. The current episode started 1 to 4 weeks ago. The onset quality is sudden. The problem occurs intermittently. The problem has been rapidly worsening. The pain is present in the epigastric region. The pain is severe. The quality of the pain is described as tightness and pressure. The pain radiates to the right arm. Associated symptoms include headaches and nausea. Pertinent negatives include no abdominal pain, cough, diaphoresis, dizziness, fever, malaise/fatigue, palpitations, shortness of breath, syncope, vomiting or weakness. She has tried nothing for the symptoms.  Her past medical history is significant for hypertension.    Past Medical History  Diagnosis Date  . Asthma   . Hypertension   . Diabetes mellitus without complication   . GERD (gastroesophageal reflux disease)    Past Surgical History  Procedure Laterality Date  . Breast reduction surgery    . Tonsillectomy     History   Social History  . Marital Status: Married    Spouse Name: N/A  . Number of Children: N/A   . Years of Education: N/A   Occupational History  . Not on file.   Social History Main Topics  . Smoking status: Never Smoker   . Smokeless tobacco: Never Used  . Alcohol Use: No  . Drug Use: No  . Sexual Activity: Not Currently   Other Topics Concern  . Not on file   Social History Narrative   No current facility-administered medications on file prior to encounter.   Current Outpatient Prescriptions on File Prior to Encounter  Medication Sig Dispense Refill  . aspirin 81 MG tablet Take 1 tablet (81 mg total) by mouth daily. 30 tablet 12  . metFORMIN (GLUCOPHAGE) 1000 MG tablet Take 1 tablet (1,000 mg total) by mouth at bedtime. 30 tablet 3  . acetaminophen (TYLENOL) 325 MG tablet Take 2 tablets (650 mg total) by mouth once. (Patient not taking: Reported on 11/12/2014) 1 tablet 0  . albuterol (PROVENTIL HFA;VENTOLIN HFA) 108 (90 BASE) MCG/ACT inhaler Inhale 2 puffs into the lungs every 6 (six) hours as needed for wheezing.    Marland Kitchen albuterol (PROVENTIL) (5 MG/ML) 0.5% nebulizer solution Take 2.5 mg by nebulization every 6 (six) hours as needed for wheezing or shortness of breath.    Marland Kitchen glucose blood (TRUETEST TEST) test strip Use as instructed 100 each 12  . polyethylene glycol powder (MIRALAX) powder Take 17 g by mouth daily. (Patient not taking: Reported on 11/12/2014) 255 g 0   No Known Allergies  Review of Systems  Constitutional: Negative for  fever, chills, malaise/fatigue and diaphoresis.  Eyes: Negative for blurred vision.  Respiratory: Negative for cough and shortness of breath.   Cardiovascular: Positive for chest pain. Negative for palpitations and syncope.  Gastrointestinal: Positive for nausea. Negative for heartburn, vomiting and abdominal pain.  Genitourinary: Negative for dysuria, urgency and frequency.  Musculoskeletal: Negative.   Neurological: Positive for headaches. Negative for dizziness and weakness.  Psychiatric/Behavioral: Negative for depression.     OBJECTIVE Blood pressure 134/72, pulse 73, temperature 98.8 F (37.1 C), temperature source Oral, resp. rate 16, height 5\' 3"  (1.6 m), weight 115.214 kg (254 lb), last menstrual period 08/05/2014, SpO2 99 %. GENERAL: Well-developed, well-nourished female in no acute distress.  EYES: normal sclera/conjunctiva; no lid-lag HENT: Atraumatic, normocephalic HEART: normal rate, heart sounds, regular rhythm RESP: normal effort, lung sounds clear and equal bilaterally ABDOMEN: Soft, non-tender MUSCULOSKELETAL: Normal ROM EXTREMITIES: Nontender, no edema NEURO/PSYCH: Alert and oriented, appropriate affect   FHT 156 by doppler  LAB RESULTS No results found for this or any previous visit (from the past 24 hour(s)).  O Positive  IMAGING US Ob Comp Less 14 Wks  10/17/2014   CLINICAL DATA:  Patient is first trimester pregnancy and spotting for 2 days  EXAM: OBSTETRIC <14 WK [place] AND TRANSVAGINAL OB US  TECHNIQUE: Both transabdominal and transvaginal ultrasound examinations were performed for complete evaluation of the gestation as well as the maternal uterus, adnexal regions, and pelvic cul-de-sac. Transvaginal technique was performed to assess early pregnancy.  COMPARISON:  None.  FINDINGS: Intrauterine gestational sac: Visualized/normal in shape.  Yolk sac:  Not visualized  Embryo:  Appears normal  Cardiac Activity: Visualized  Heart Rate: Between 178 in 203  bpm  CRL:  41  mm   11 w   0 d                  [place] EDC: 05/08/2015  Maternal uterus/adnexae: Small subchorionic hemorrhage. Right ovary not seen. Left ovary normal with corpus luteum noted. No free fluid.  IMPRESSION: Live intrauterine gestation with small subchorionic hemorrhage   Electronically Signed   By: Esperanza Heir M.D.   On: 10/17/2014 16:50   US Ob Transvaginal  10/17/2014   CLINICAL DATA:  Patient is first trimester pregnancy and spotting for 2 days  EXAM: OBSTETRIC <14 WK [place] AND TRANSVAGINAL OB US  TECHNIQUE: Both transabdominal  and transvaginal ultrasound examinations were performed for complete evaluation of the gestation as well as the maternal uterus, adnexal regions, and pelvic cul-de-sac. Transvaginal technique was performed to assess early pregnancy.  COMPARISON:  None.  FINDINGS: Intrauterine gestational sac: Visualized/normal in shape.  Yolk sac:  Not visualized  Embryo:  Appears normal  Cardiac Activity: Visualized  Heart Rate: Between 178 in 203  bpm  CRL:  41  mm   11 w   0 d                  [place] EDC: 05/08/2015  Maternal uterus/adnexae: Small subchorionic hemorrhage. Right ovary not seen. Left ovary normal with corpus luteum noted. No free fluid.  IMPRESSION: Live intrauterine gestation with small subchorionic hemorrhage   Electronically Signed   By: Esperanza Heir M.D.   On: 10/17/2014 16:50   ED management  Pt presented to ED with chest pain and h/a and was given IV NS, Benadryl 25 mg IV, Reglan 10 mg IV, and Decadron 10 mg IV.  Immediately following Decadron dose, pt reported feeling burning sensation in genital and anal area, lasting  3-4 minutes then spontaneously resolving.  Discussed this as possible side effect to steroid injection and reassurance provided to pt.  Pt h/a and chest pain resolved following IV fluids/medication.  GI cocktail ordered for pt but never given.  EKG performed and discussed with Dr Adrian Blackwater.  Pt to continue plan to see cardiology next month as scheduled.  ASSESSMENT 1. Heartburn during pregnancy in second trimester   2. Hypertension affecting pregnancy, antepartum, second trimester   3. Chest pain radiating to arm     PLAN Discharge home with Chest Pain HTN in pregnancy precautions Zantac 150 mg BID PRN or scheduled if preferred to prevent heartburn   Follow-up Information    Follow up with [facility].   Specialty:  Obstetrics and Gynecology   Why:  As scheduled   Contact information:   [address] [place] [address] [phone]       Follow up with [facility].   Why:  As scheduled   Contact information:   [address] [place] [address] [phone]      Follow up with [facility] [place] [facility].   Why:  As needed for emergencies   Contact information:   [address] [id number] mc [place] [address] [phone]      Sharen Counter Certified Nurse-Midwife 11/12/2014  10:04 PM

## 2014-11-13 ENCOUNTER — Telehealth: Payer: Self-pay

## 2014-11-13 MED ORDER — METRONIDAZOLE 500 MG PO TABS: 500.0000 mg | ORAL_TABLET | Freq: Two times a day (BID) | ORAL | Status: DC

## 2014-11-13 NOTE — Telephone Encounter (Signed)
Called pt with Spanish interpreter, Lorinda Creed, and informed pt of BV and tx has been sent to her [facility] pharmacy off [address].  Pt stated understanding with no further questions.

## 2014-11-16 ENCOUNTER — Ambulatory Visit (INDEPENDENT_AMBULATORY_CARE_PROVIDER_SITE_OTHER): Payer: Medicaid Other | Admitting: Obstetrics and Gynecology

## 2014-11-16 ENCOUNTER — Encounter: Payer: Self-pay | Admitting: Obstetrics and Gynecology

## 2014-11-16 ENCOUNTER — Encounter: Payer: Medicaid Other | Admitting: *Deleted

## 2014-11-16 VITALS — BP 126/63 | HR 84 | Temp 98.3°F | Wt 250.1 lb

## 2014-11-16 DIAGNOSIS — O24112 Pre-existing diabetes mellitus, type 2, in pregnancy, second trimester: Secondary | ICD-10-CM | POA: Diagnosis not present

## 2014-11-16 DIAGNOSIS — O24419 Gestational diabetes mellitus in pregnancy, unspecified control: Secondary | ICD-10-CM

## 2014-11-16 LAB — POCT URINALYSIS DIP (DEVICE)
Bilirubin Urine: NEGATIVE
Glucose, UA: NEGATIVE mg/dL
Hgb urine dipstick: NEGATIVE
KETONES UR: 40 mg/dL — AB
Nitrite: NEGATIVE
Protein, ur: 30 mg/dL — AB
UROBILINOGEN UA: 0.2 mg/dL (ref 0.0–1.0)
pH: 5.5 (ref 5.0–8.0)

## 2014-11-16 MED ORDER — GLYBURIDE 2.5 MG PO TABS
2.5000 mg | ORAL_TABLET | Freq: Two times a day (BID) | ORAL | Status: DC
Start: 2014-11-16 — End: 2015-03-01

## 2014-11-16 NOTE — Progress Notes (Signed)
Used interpreter Steele Berg. States had EKG in [facility] when she went there because she couldn't breathe and had headache. States they said EKG a little abnormal. C/o pressure in her head everyday , starting at 3pm everyday.

## 2014-11-16 NOTE — Patient Instructions (Signed)
Angina de pecho (Angina Pectoris) La angina pectoris, tambin llamada angina de pecho, es una molestia extrema en el pecho, el cuello o el brazo causada por la falta de sangre en la capa media y ms gruesa de la pared del corazn (miocardio). Se puede sentir como un dolor fuerte u opresivo. Se puede sentir como un dolor que aplasta o retuerce. Algunas personas refieren que se siente como indigestin o gases. Puede bajar por los hombros, la espalda y [place] brazos. Algunas personas pueden tener sntomas diferentes al [employer]. Estos sntomas incluyen fatiga, falta de aire, sudor fro o nuseas. Hay cuatro tipos diferentes de angina pectoris:  Angina estable: La angina estable ocurre generalmente en episodios de frecuencia y duracin predecible. Por lo general, la causa es la actividad fsica, el estrs emocional o la excitacin. En estos momentos el miocardio necesita ms oxgeno. La angina estable por lo general dura unos pocos minutos y a menudo se [place] tomando un medicamento que se coloca debajo de la lengua (sublingual). El medicamento se llama nitroglicerina. La causa de la angina estable es una acumulacin de placa dentro de las arterias que restringe el flujo de sangre al msculo cardaco (aterosclerosis).  Angina inestable: La angina inestable puede ocurrir incluso cuando el cuerpo experimenta poco o ningn esfuerzo fsico. Puede ocurrir mientras duerme. Tambin puede ocurrir Foot Lockerestando en reposo. Puede aumentar de repente en gravedad o frecuencia. Es posible que no se alivie con nitroglicerina sublingual. Puede durar hasta 30 minutos. La causa ms frecuente de angina inestable es un cogulo sanguneo que se ha desarrollado en la parte superior de la acumulacin de placa en el interior de una arteria coronaria. Puede conducir a un ataque cardaco si el cogulo obstruye completamente la arteria.  Angina microvascular: Este tipo de angina se produce por un trastorno de los vasos sanguneos diminutos llamados  arteriolas. Es ms comn en las mujeres. El dolor puede ser ms intenso y durar ms que otros tipos de angina de pecho.  Angina de Prinzmetal o angina variante: Este tipo de angina de pecho por lo general ocurre cuando el cuerpo realiza poco o ningn esfuerzo fsico. Ocurre sobre todo en las horas tempranas de la [place]. La causa es un espasmo de la arteria coronaria. INSTRUCCIONES PARA EL CUIDADO EN EL HOGAR   Tome los medicamentos de venta libre o recetados solamente como se lo haya indicado el mdico.  Mantngase activo o aumente los ejercicios segn las indicaciones del mdico.  Limite la actividad intensa segn las indicaciones del mdico.  Controle el peso de los objetos que levanta segn lo que le haya indicado el mdico.  Mantenga un peso saludable.  Aprenda a conocer y consumir alimentos saludables para el corazn.  No consuma ningn producto que contenga tabaco, como cigarrillos, tabaco de Theatre managermascar o Administrator, Civil Servicecigarrillos electrnicos. SOLICITE ATENCIN MDICA DE INMEDIATO SI:  Experimenta los sntomas siguientes:  Dolor o Dentistmalestar en el pecho, el cuello, la zona profunda del hombro o del brazo, que dura ms de unos pocos minutos.  Dolor o Pharmacist, communitymalestar en el pecho, el cuello, la zona profunda del hombro o el brazo que se va y regresa en varias ocasiones.  Sudoracin profusa con molestias, sin causa aparente.  Falta de aire o dificultades respiratorias.  Angina pectoris que no mejora despus de unos minutos de descanso o despus de tomar nitroglicerina sublingual. Todos estos pueden ser sntomas de un ataque al corazn, que es una emergencia mdica. Obtenga ayuda mdica de inmediato. Comunquese inmediatamente con el servicio de emergencias  de su localidad (911 en [place] No  conduzca por su cuenta hasta el hospital y no  espere a que los sntomas desaparezcan. ASEGRESE DE QUE:  Comprende estas instrucciones.  Controlar su afeccin.  Recibir ayuda de inmediato si no mejora o si  empeora. Document Released: 04/10/2005 Document Revised: 04/15/2013 [facility] Patient Information 2015 [place], [place]. This information is not intended to replace advice given to you by your health care provider. Make sure you discuss any questions you have with your health care provider. Cefalea tensional  (Tension Headache)  Una cefalea tensional es una sensacin de dolor y opresin en la frente y los lados de la cabeza. El dolor puede ser sordo o puede sentirse que comprime (constrictivo). Este es el tipo ms comn de dolor de [place]. Generalmente no se asocian con nuseas o vmitos y no empeoran con la actividad fsica. Pueden durar desde 30 minutos a varios das.  CAUSAS  Se desconoce la causa exacta, pero puede ser causada por las sustancias qumicas y las hormonas del cerebro que producen dolor. Suelen comenzar despus de una situacin de estrs, ansiedad o por depresin. Otros desencadenantes pueden ser:   El alcohol.  Cafena (demasiada o abstinencia).  Infecciones respiratorias (resfriados, gripes, sinusitis).  Problemas dentales o apretar los dientes.  Fatiga.  Mantener la cabeza y el cuello en una posicin demasiado tiempo mientras utiliza un ordenador. SNTOMAS   Presin alrededor de la cabeza.   Dolor "sordo" en la cabeza.   Dolor que siente sobre la frente y los lados de la cabeza.   Sensibilidad en los msculos de la cabeza, del cuello y de los hombros. DIAGNSTICO  El diagnstico se realiza en base a:   Sntomas.   Examen fsico.   Neomia Dear TC (tomografa computada) o resonancia magntica de la cabeza. Se pueden pedir estas pruebas, si los sntomas son severos o inusuales. TRATAMIENTO  Le recetarn medicamentos que ayudan a [employer].  INSTRUCCIONES PARA EL CUIDADO EN EL HOGAR   Slo tome medicamentos de venta libre o recetados para Primary school teacher o Environmental health practitioner, segn las indicaciones de su mdico.   Cuando sienta dolor de cabeza acustese en  un cuarto oscuro y tranquilo.   Lleve un registro diario para Financial risk analyst lo que [employer] dolores de [place]. Por ejemplo, escriba:  Lo que come y bebe.  Cunto tiempo duerme.  Todo cambio en la dieta o medicamentos.  Trate con masajes u otras tcnicas de relajacin.   Pueden utilizarse bolsas de hielo o calor aplicadas a la cabeza o al cuello. selos 3 a 4 veces por da de 15 a 20 minutos por vez, o como sea necesario.   Limite las situaciones de estrs.   Sintese con la espalda recta y no tense los msculos.   Si fuma, deje de hacerlo.  Limite el consumo de bebidas alcohlicas.  Consuma menos cantidad de cafena o deje de tomarla.  Coma y haga ejercicios regularmente.  Duerma entre 7 y 9 horas o como lo indique su mdico.  Evite el uso excesivo de medicamentos para Investment banker, operational recurrente de cabeza que pueden ocurrir.  SOLICITE ATENCIN MDICA SI:   Tiene problemas con los Arboriculturist.  El medicamento no le hace efecto.  El dolor de cabeza que senta habitualmente es diferente.  Tiene nuseas o vmitos. SOLICITE ATENCIN MDICA DE INMEDIATO SI:   El dolor se hace cada vez ms intenso.  Tiene fiebre.  Presenta rigidez en  el cuello.  Sufre prdida de la visin.  Presenta debilidad muscular o prdida del control muscular.  Pierde equilibrio o tiene problemas para Advertising account planner.  Sufre mareos o se desmaya.  Tiene sntomas graves que son diferentes a los primeros sntomas. ASEGRESE DE QUE:   Comprende estas instrucciones.  Controlar su enfermedad.  Solicitar ayuda de inmediato si no mejora o si empeora. Document Released: 01/18/2005 Document Revised: 07/03/2011 [facility] Patient Information 2015 [place], [place]. This information is not intended to replace advice given to you by your health care provider. Make sure you discuss any questions you have with your health care  provider. Estreimiento (Constipation) Estreimiento significa que una persona tiene menos de tres evacuaciones en una semana, dificultad para defecar, o que las heces son secas, duras, o ms grandes que lo normal. A medida que envejecemos el estreimiento es ms comn. Si intenta curar el estreimiento con medicamentos que producen la evacuacin de las heces (laxantes), el problema puede empeorar. El uso prolongado de laxantes puede hacer que los msculos del colon se debiliten. Una dieta baja en fibra, no tomar suficientes lquidos y el uso de ciertos medicamentos pueden Scientist, research (life sciences).  CAUSAS   Ciertos medicamentos, como los antidepresivos, analgsicos, suplementos de hierro, anticidos y diurticos.  Algunas enfermedades, como la diabetes, el sndrome del colon irritable, enfermedad de la tiroides, o depresin.  No beber suficiente agua.  No consumir suficientes alimentos ricos en fibra.  Situaciones de estrs o viajes.  Falta de actividad fsica o de ejercicio.  Ignorar la necesidad sbita de Advertising copywriter.  Uso en exceso de laxantes. SIGNOS Y SNTOMAS   Defecar menos de tres veces por semana.  Dificultad para defecar.  Tener las heces secas y duras, o ms grandes que las normales.  Sensacin de estar lleno o hinchado.  Dolor en la parte baja del abdomen.  No sentir alivio despus de defecar. DIAGNSTICO  El mdico le har una historia clnica y un examen fsico. Pueden hacerle exmenes adicionales para el estreimiento grave. Estos estudios pueden ser:  Un radiografa con enema de bario para examinar el recto, el colon y, en algunos casos, el intestino delgado.  Una sigmoidoscopia para examinar el colon inferior.  Una colonoscopia para examinar todo el colon. TRATAMIENTO  El tratamiento depender de la gravedad del estreimiento y de la causa. Algunos tratamientos nutricionales son beber ms lquidos y comer ms alimentos ricos en fibra. El cambio en el estilo  de vida incluye hacer ejercicios de [place] regular. Si estas recomendaciones para Public relations account executive dieta y en el estilo de vida no ayudan, el mdico le puede indicar el uso de laxantes de venta libre para ayudarlo a Advertising copywriter. Los medicamentos recetados se pueden prescribir si los medicamentos de venta libre no lo [place].  INSTRUCCIONES PARA EL CUIDADO EN EL HOGAR   Consuma alimentos con alto contenido de [place], como frutas, vegetales, cereales integrales y porotos.  Limite los alimentos procesados ricos en grasas y azcar, como las papas fritas, hamburguesas, galletas, dulces y refrescos.  Puede agregar un suplemento de fibra a su dieta si no obtiene lo suficiente de los alimentos.  Beba suficiente lquido para Photographer orina clara o de color amarillo plido.  Haga ejercicio regularmente o segn las indicaciones del mdico.  Vaya al bao cuando sienta la necesidad de ir. No se aguante las ganas.  Tome solo medicamentos de venta libre o recetados, segn las indicaciones del mdico. No tome otros medicamentos para el estreimiento sin consultarlo antes con su  mdicoRomona Curls ATENCIN MDICA DE INMEDIATO SI:   Observa sangre brillante en las heces.  El estreimiento dura ms de 4 das o [place].  Siente dolor abdominal o rectal.  Las heces son delgadas como un lpiz.  Pierde peso de [place] inexplicable. ASEGRESE DE QUE:   Comprende estas instrucciones.  Controlar su afeccin.  Recibir ayuda de inmediato si no mejora o si empeora. Document Released: 04/30/2007 Document Revised: 04/15/2013 [facility] Patient Information 2015 [place], [place]. This information is not intended to replace advice given to you by your health care provider. Make sure you discuss any questions you have with your health care provider.

## 2014-11-16 NOTE — Progress Notes (Signed)
Evaluation and management procedures were performed by M-S3 under my supervision/collaboration. Chart reviewed, patient examined by me and I agree with management and plan. Subjective:  Lisa Moreno is a 39 y.o. G2P1001 at [redacted]w[redacted]d being seen today for ongoing prenatal care.  Patient reports headache.  Contractions: Not present.  Vag. Bleeding: None. Movement: Present. Denies leaking of fluid.   The following portions of the patient's history were reviewed and updated as appropriate: allergies, current medications, past family history, past medical history, past social history, past surgical history and problem list.   Objective:   Filed Vitals:   11/16/14 0926  BP: 126/63  Pulse: 84  Temp: 98.3 F (36.8 C)  Weight: 250 lb 1.6 oz (113.445 kg)    Fetal Status: Fetal Heart Rate (bpm): 150   Movement: Present     General:  Alert, oriented and cooperative. Patient is in no acute distress.  Skin: Skin is warm and dry. No rash noted.   Cardiovascular: Normal heart rate noted  Respiratory: Normal respiratory effort, no problems with respiration noted  Abdomen: Soft, gravid, appropriate for gestational age. Pain/Pressure: Present     Vaginal: Vag. Bleeding: None.       Cervix: Not evaluated        Extremities: Normal range of motion.  Edema: None  Mental Status: Normal mood and affect. Normal behavior. Normal judgment and thought content.   Urinalysis: Urine Protein: 1+ Urine Glucose: Negative  Assessment and Plan:  Pregnancy: G2P1001 at [redacted]w[redacted]d  1. Gestational diabetes mellitus, antepartum  - glyBURIDE (DIABETA) 2.5 MG tablet; Take 1 tablet (2.5 mg total) by mouth 2 (two) times daily with a meal.  Dispense: 60 tablet; Refill: 3  Preterm labor symptoms and general obstetric precautions including but not limited to vaginal bleeding, contractions, leaking of fluid and fetal movement were reviewed in detail with the patient. Please refer to After Visit Summary for other counseling  recommendations.  Return in about 1 week (around 11/23/2014). to assess glycemic control on new regimen Has [place] scheduled  Danae Orleans, CNM

## 2014-11-16 NOTE — Progress Notes (Signed)
Reviewed glucose readings. Both >50% of FBS & pp readings greater than preferred parameters. In consult with D. Poe CNM. RS glyburide 2.5mg  BID.

## 2014-11-16 NOTE — Progress Notes (Signed)
Subjective:  Lisa Moreno is a 39 y.o. G2P1001 at [redacted]w[redacted]d being seen today for ongoing prenatal care.  Patient reports backache, no bleeding, no contractions, no leaking and constipation causing her backache.  Contractions: Not present.  Vag. Bleeding: None. Movement: Present. Denies leaking of fluid. Her OB history is pertinent for advanced age, obesity, Type 2 DM, and HTN. She is currently taking Metformin  BID, more than 50% of her blood sugar readings are above target for both fasting and postprandial goal. She also endorses headaches The headaches started 2 weeks ago and they occur on a daily basis with onset usually in the afternoon. The pain occurs independent of her diet or food intake. She has taken Tylenol  for the headache which she says helps for a little while.  The following portions of the patient's history were reviewed and updated as appropriate: allergies, current medications, past family history, past medical history, past social history, past surgical history and problem list.   Objective:   Filed Vitals:   11/16/14 0926  BP: 126/63  Pulse: 84  Temp: 98.3 F (36.8 C)  Weight: 250 lb 1.6 oz (113.445 kg)    Fetal Status: Fetal Heart Rate (bpm): 150   Movement: Present     General:  Alert, oriented and cooperative. Patient is in no acute distress.  Skin: Skin is warm and dry. No rash noted.   Cardiovascular: Normal heart rate noted  Respiratory: Normal respiratory effort, no problems with respiration noted  Abdomen: Soft, gravid, appropriate for gestational age. Pain/Pressure: Present     Vaginal: Vag. Bleeding: None.       Cervix: Not evaluated        Extremities: Normal range of motion.  Edema: None  Mental Status: Normal mood and affect. Normal behavior. Normal judgment and thought content.   Urinalysis: Urine Protein: 1+ Urine Glucose: Negative  Assessment and Plan:  Pregnancy: G2P1001 at [redacted]w[redacted]d with assuring FHR on prenatal visit on  11/16/2014.  1) Constipation- Patient had prescription for Miralax from ED visit last Thursday but was unable to obtain due to the cost/lack of insurance. I recommend generic version that can be bought at less than half the price at [facility], as well as increasing fiber/water intake  2)Chest pain- Patient is currently not in chest pain, but had an episode of chest pain w/ pain radiating to her arm and wanted to inquire about results of EKG from ED visit last Thursday. I was not able to provide any further details on this matter, but she has f/u appt with Dr. Andreas Ohm, MD at [facility].  3)Diabetes: [name] has more than 50% of her blood sugars that are above postprandial and fasting target goals. She is only taking Metformin  BID. At this time we will start Glyburide 2.5mg  BID, once before breakfast and once with her late night snack prior to sleep. Patient counseled to maintain healthy diet and continue blood sugar log so we can use as a reference to assess pharmacologic effectiveness at this dose of Glyburide.  4) Headaches: Is taking  Tylenol once a day for headache. Patient counseled that she can take up to  in 24 hr period, safely for headache.  Please refer to After Visit Summary for other counseling recommendations Return in about 1 week (around 11/23/2014).   Kandyce Rud, Med Student

## 2014-11-23 ENCOUNTER — Ambulatory Visit (INDEPENDENT_AMBULATORY_CARE_PROVIDER_SITE_OTHER): Payer: Medicaid Other | Admitting: Obstetrics & Gynecology

## 2014-11-23 ENCOUNTER — Encounter: Payer: Self-pay | Admitting: Obstetrics & Gynecology

## 2014-11-23 VITALS — BP 126/77 | HR 92 | Temp 98.9°F | Wt 248.1 lb

## 2014-11-23 DIAGNOSIS — O24112 Pre-existing diabetes mellitus, type 2, in pregnancy, second trimester: Secondary | ICD-10-CM

## 2014-11-23 DIAGNOSIS — O162 Unspecified maternal hypertension, second trimester: Secondary | ICD-10-CM

## 2014-11-23 DIAGNOSIS — E119 Type 2 diabetes mellitus without complications: Secondary | ICD-10-CM | POA: Diagnosis not present

## 2014-11-23 DIAGNOSIS — O169 Unspecified maternal hypertension, unspecified trimester: Secondary | ICD-10-CM

## 2014-11-23 LAB — POCT URINALYSIS DIP (DEVICE)
Glucose, UA: NEGATIVE mg/dL
Ketones, ur: 80 mg/dL — AB
Nitrite: NEGATIVE
Protein, ur: 30 mg/dL — AB
Specific Gravity, Urine: >=1.03 (ref 1.005–1.030)
UROBILINOGEN UA: 0.2 mg/dL (ref 0.0–1.0)
pH: 5.5 (ref 5.0–8.0)

## 2014-11-23 LAB — GLUCOSE, CAPILLARY: Glucose-Capillary: 138 mg/dL — ABNORMAL HIGH (ref 65–99)

## 2014-11-23 NOTE — Progress Notes (Signed)
Subjective:  Lisa Moreno is a 39 y.o. G2P1001 at [redacted]w[redacted]d being seen today for ongoing prenatal care.  Patient reports headache.  Contractions: Not present.  Vag. Bleeding: None.  . Denies leaking of fluid.   The following portions of the patient's history were reviewed and updated as appropriate: allergies, current medications, past family history, past medical history, past social history, past surgical history and problem list.   Objective:   Filed Vitals:   11/23/14 1004  BP: 126/77  Pulse: 92  Temp: 98.9 F (37.2 C)  Weight: 248 lb 1.6 oz (112.537 kg)    Fetal Status: Fetal Heart Rate (bpm): 138         General:  Alert, oriented and cooperative. Patient is in no acute distress.  Skin: Skin is warm and dry. No rash noted.   Cardiovascular: Normal heart rate noted  Respiratory: Normal respiratory effort, no problems with respiration noted  Abdomen: Soft, gravid, appropriate for gestational age. Pain/Pressure: Absent     Vaginal: Vag. Bleeding: None.       Cervix: Not evaluated        Extremities: Normal range of motion.  Edema: None  Mental Status: Normal mood and affect. Normal behavior. Normal judgment and thought content.   Urinalysis: Urine Protein: 1+ Urine Glucose: Negative  Assessment and Plan:  Pregnancy: G2P1001 at [redacted]w[redacted]d  1. Hypertension affecting pregnancy, antepartum, unspecified trimester Baby asa daily  2. Pregnancy with type 2 diabetes mellitus in second trimester Her sugars are all in range.  3. She will get NIPS and MSAFP today. Her anatomy scan is scheduled for 12-09-14.  Preterm labor symptoms and general obstetric precautions including but not limited to vaginal bleeding, contractions, leaking of fluid and fetal movement were reviewed in detail with the patient. Please refer to After Visit Summary for other counseling recommendations.  No Follow-up on file.   Allie Bossier, MD

## 2014-11-23 NOTE — Progress Notes (Signed)
Interpreter Levora Angel  Pt reports taking dramamine  CBG 138 Pt would like something for nausea/vomiting  Pt requested to have another nebulizer machine due to the one she has is broken.  Was informed by [facility] that pt should contact the company that is located on the machine to either fix or receive another one.  Informed pt of recommendation.  Pt stated understanding with no further questions.

## 2014-11-26 LAB — ALPHA FETOPROTEIN, MATERNAL
AFP: 41.1 ng/mL
CURR GEST AGE: 15.5 wks.days
MoM for AFP: 1.91
Open Spina bifida: NEGATIVE

## 2014-12-01 ENCOUNTER — Other Ambulatory Visit: Payer: Self-pay | Admitting: Obstetrics & Gynecology

## 2014-12-01 DIAGNOSIS — O24112 Pre-existing diabetes mellitus, type 2, in pregnancy, second trimester: Secondary | ICD-10-CM

## 2014-12-01 DIAGNOSIS — Z1389 Encounter for screening for other disorder: Secondary | ICD-10-CM

## 2014-12-01 DIAGNOSIS — Z3A18 18 weeks gestation of pregnancy: Secondary | ICD-10-CM

## 2014-12-07 ENCOUNTER — Ambulatory Visit (INDEPENDENT_AMBULATORY_CARE_PROVIDER_SITE_OTHER): Payer: Medicaid Other | Admitting: Family Medicine

## 2014-12-07 ENCOUNTER — Telehealth: Payer: Self-pay | Admitting: *Deleted

## 2014-12-07 VITALS — BP 132/84 | HR 96 | Temp 98.5°F | Wt 247.9 lb

## 2014-12-07 DIAGNOSIS — E119 Type 2 diabetes mellitus without complications: Secondary | ICD-10-CM | POA: Diagnosis not present

## 2014-12-07 DIAGNOSIS — O169 Unspecified maternal hypertension, unspecified trimester: Secondary | ICD-10-CM

## 2014-12-07 DIAGNOSIS — O24112 Pre-existing diabetes mellitus, type 2, in pregnancy, second trimester: Secondary | ICD-10-CM

## 2014-12-07 DIAGNOSIS — O0992 Supervision of high risk pregnancy, unspecified, second trimester: Secondary | ICD-10-CM | POA: Diagnosis present

## 2014-12-07 DIAGNOSIS — O219 Vomiting of pregnancy, unspecified: Secondary | ICD-10-CM

## 2014-12-07 DIAGNOSIS — O0990 Supervision of high risk pregnancy, unspecified, unspecified trimester: Secondary | ICD-10-CM

## 2014-12-07 HISTORY — DX: Supervision of high risk pregnancy, unspecified, unspecified trimester: O09.90

## 2014-12-07 LAB — POCT URINALYSIS DIP (DEVICE)
Glucose, UA: NEGATIVE mg/dL
Hgb urine dipstick: NEGATIVE
Ketones, ur: 15 mg/dL — AB
Leukocytes, UA: NEGATIVE
Nitrite: NEGATIVE
PH: 5.5 (ref 5.0–8.0)
PROTEIN: 30 mg/dL — AB
UROBILINOGEN UA: 1 mg/dL (ref 0.0–1.0)

## 2014-12-07 MED ORDER — METOCLOPRAMIDE HCL 10 MG PO TABS
10.0000 mg | ORAL_TABLET | Freq: Four times a day (QID) | ORAL | Status: DC | PRN
Start: 2014-12-07 — End: 2015-02-15

## 2014-12-07 MED ORDER — ALBUTEROL SULFATE HFA 108 (90 BASE) MCG/ACT IN AERS: 2.0000 | INHALATION_SPRAY | Freq: Four times a day (QID) | RESPIRATORY_TRACT | Status: DC | PRN

## 2014-12-07 NOTE — Progress Notes (Signed)
Pt needs albuterol inhaler refilled Pt stopped taking Flagyl due to vomiting medication, for vaginal infection Pt is experiencing severe pressure in groin when standing for any amount of time, for the past week. Pt has not been wearing a pregnancy support belt. Pt has been taking dramamine for nausea, needs different medication, due to dizziness. Ketones: 15, Bilirubin : small

## 2014-12-07 NOTE — Telephone Encounter (Addendum)
Received a voicemail message from phone number associated with this patient in Spanish- was left a few days ago.  Unable to understand what patient states. Will need to be called with interpreter. Per chart patient was seen in clinic today.

## 2014-12-07 NOTE — Progress Notes (Signed)
Subjective:  Lisa Moreno is a 39 y.o. G2P1001 at [redacted]w[redacted]d being seen today for ongoing prenatal care.  Patient reports no complaints.  Contractions: Not present.  Vag. Bleeding: None. Movement: Present. Denies leaking of fluid.   GDM: Patient taking glyburide and metformin.  Reports no hypoglycemic episodes.  Tolerating medication well Reports CBGs as: Fasting: 70-80s 2hr PP:  Less than 120   The following portions of the patient's history were reviewed and updated as appropriate: allergies, current medications, past family history, past medical history, past social history, past surgical history and problem list.   Objective:   Filed Vitals:   12/07/14 1110  BP: 132/84  Pulse: 96  Temp: 98.5 F (36.9 C)  Weight: 247 lb 14.4 oz (112.447 kg)    Fetal Status: Fetal Heart Rate (bpm): 142   Movement: Present     General:  Alert, oriented and cooperative. Patient is in no acute distress.  Skin: Skin is warm and dry. No rash noted.   Cardiovascular: Normal heart rate noted  Respiratory: Normal respiratory effort, no problems with respiration noted  Abdomen: Soft, gravid, appropriate for gestational age. Pain/Pressure: Present     Pelvic: Vag. Bleeding: None     Cervical exam deferred        Extremities: Normal range of motion.  Edema: Trace  Mental Status: Normal mood and affect. Normal behavior. Normal judgment and thought content.   Urinalysis: Urine Protein: 1+ Urine Glucose: Negative  Assessment and Plan:  Pregnancy: G2P1001 at [redacted]w[redacted]d  1. Supervision of high risk pregnancy, antepartum, second trimester FHT normal  2. Pregnancy with type 2 diabetes mellitus in second trimester Continue medications.  Bring log book next appt.  3. Hypertension affecting pregnancy, antepartum, unspecified trimester Continue ASA  4.  Nausea in pregnancy  Reglan prescribed.  Preterm labor symptoms and general obstetric precautions including but not limited to vaginal bleeding,  contractions, leaking of fluid and fetal movement were reviewed in detail with the patient. Please refer to After Visit Summary for other counseling recommendations.  Return in about 4 months (around 04/08/2015).   Levie Heritage, DO

## 2014-12-07 NOTE — Patient Instructions (Signed)
Segundo trimestre de embarazo (Second Trimester of Pregnancy) El segundo trimestre va desde la semana13 hasta la 28, desde el cuarto hasta el sexto mes, y suele ser el momento en el que mejor se siente. Su organismo se ha adaptado a estar embarazada y comienza a sentirse fsicamente mejor. En general, las nuseas matutinas han disminuido o han desaparecido completamente, p El segundo trimestre es tambin la poca en la que el feto se desarrolla rpidamente. Hacia el final del sexto mes, el feto mide aproximadamente 9pulgadas (23cm) y pesa alrededor de 1 libras (700g). Es probable que sienta que el beb se mueve (da pataditas) entre las 18 y 20semanas del embarazo. CAMBIOS EN EL ORGANISMO Su organismo atraviesa por muchos cambios durante el embarazo, y estos varan de una mujer a otra.   Seguir aumentando de peso. Notar que la parte baja del abdomen sobresale.  Podrn aparecer las primeras estras en las caderas, el abdomen y las mamas.  Es posible que tenga dolores de cabeza que pueden aliviarse con los medicamentos que su mdico autorice.  Tal vez tenga necesidad de orinar con ms frecuencia porque el feto est ejerciendo presin sobre la vejiga.  Debido al embarazo podr sentir acidez estomacal con frecuencia.  Puede estar estreida, ya que ciertas hormonas enlentecen los movimientos de los msculos que empujan los desechos a travs de los intestinos.  Pueden aparecer hemorroides o abultarse e hincharse las venas (venas varicosas).  Puede tener dolor de espalda que se debe al aumento de peso y a que las hormonas del embarazo relajan las articulaciones entre los huesos de la pelvis, y como consecuencia de la modificacin del peso y los msculos que mantienen el equilibrio.  Las mamas seguirn creciendo y le dolern.  Las encas pueden sangrar y estar sensibles al cepillado y al hilo dental.  Pueden aparecer zonas oscuras o manchas (cloasma, mscara del embarazo) en el rostro que  probablemente se atenuarn despus del nacimiento del beb.  Es posible que se forme una lnea oscura desde el ombligo hasta la zona del pubis (linea nigra) que probablemente se atenuarn despus del nacimiento del beb.  Tal vez haya cambios en el cabello que pueden incluir su engrosamiento, crecimiento rpido y cambios en la textura. Adems, a algunas mujeres se les cae el cabello durante o despus del embarazo, o tienen el cabello seco o fino. Lo ms probable es que el cabello se le normalice despus del nacimiento del beb. QU DEBE ESPERAR EN LAS CONSULTAS PRENATALES Durante una visita prenatal de rutina:  La pesarn para asegurarse de que usted y el feto estn creciendo normalmente.  Le tomarn la presin arterial.  Le medirn el abdomen para controlar el desarrollo del beb.  Se escucharn los latidos cardacos fetales.  Se evaluarn los resultados de los estudios solicitados en visitas anteriores. El mdico puede preguntarle lo siguiente:  Cmo se siente.  Si siente los movimientos del beb.  Si ha tenido sntomas anormales, como prdida de lquido, sangrado, dolores de cabeza intensos o clicos abdominales.  Si tiene alguna pregunta. Otros estudios que podrn realizarse durante el segundo trimestre incluyen lo siguiente:  Anlisis de sangre para detectar:  Concentraciones de hierro bajas (anemia).  Diabetes gestacional (entre la semana 24 y la 28).  Anticuerpos Rh.  Anlisis de orina para detectar infecciones, diabetes o protenas en la orina.  Una ecografa para confirmar que el beb crece y se desarrolla correctamente.  Una amniocentesis para diagnosticar posibles problemas genticos.  Estudios del feto para descartar espina   bfida y sndrome de Down. INSTRUCCIONES PARA EL CUIDADO EN EL HOGAR   Evite fumar, consumir hierbas, beber alcohol y tomar frmacos que no le hayan recetado. Estas sustancias qumicas afectan la formacin y el desarrollo del beb.  Siga  las indicaciones del mdico en relacin con el uso de medicamentos. Durante el embarazo, hay medicamentos que son seguros de tomar y otros que no.  Haga actividad fsica solo en la forma indicada por el mdico. Sentir clicos uterinos es un buen signo para detener la actividad fsica.  Contine comiendo alimentos que sanos con regularidad.  Use un sostn que le brinde buen soporte si le duelen las mamas.  No se d baos de inmersin en agua caliente, baos turcos ni saunas.  Colquese el cinturn de seguridad cuando conduzca.  No coma carne cruda ni queso sin cocinar; evite el contacto con las bandejas sanitarias de los gatos y la tierra que estos animales usan. Estos elementos contienen grmenes que pueden causar defectos congnitos en el beb.  Tome las vitaminas prenatales.  Si est estreida, pruebe un laxante suave (si el mdico lo autoriza). Consuma ms alimentos ricos en fibra, como vegetales y frutas frescos y cereales integrales. Beba gran cantidad de lquido para mantener la orina de tono claro o color amarillo plido.  Dese baos de asiento con agua tibia para aliviar el dolor o las molestias causadas por las hemorroides. Use una crema para las hemorroides si el mdico la autoriza.  Si tiene venas varicosas, use medias de descanso. Eleve los pies durante 15minutos, 3 o 4veces por da. Limite la cantidad de sal en su dieta.  No levante objetos pesados, use zapatos de tacones bajos y mantenga una buena postura.  Descanse con las piernas elevadas si tiene calambres o dolor de cintura.  Visite a su dentista si an no lo ha hecho durante el embarazo. Use un cepillo de dientes blando para higienizarse los dientes y psese el hilo dental con suavidad.  Puede seguir manteniendo relaciones sexuales, a menos que el mdico le indique lo contrario.  Concurra a todas las visitas prenatales segn las indicaciones de su mdico. SOLICITE ATENCIN MDICA SI:   Tiene mareos.  Siente  clicos leves, presin en la pelvis o dolor persistente en el abdomen.  Tiene nuseas, vmitos o diarrea persistentes.  Tiene secrecin vaginal con mal olor.  Siente dolor al orinar. SOLICITE ATENCIN MDICA DE INMEDIATO SI:   Tiene fiebre.  Tiene una prdida de lquido por la vagina.  Tiene sangrado o pequeas prdidas vaginales.  Siente dolor intenso o clicos en el abdomen.  Sube o baja de peso rpidamente.  Tiene dificultad para respirar y siente dolor de pecho.  Sbitamente se le hinchan mucho el rostro, las manos, los tobillos, los pies o las piernas.  No ha sentido los movimientos del beb durante una hora.  Siente un dolor de cabeza intenso que no se alivia con medicamentos.  Hay cambios en la visin. Document Released: 01/18/2005 Document Revised: 04/15/2013 [employer] Patient Information 2015 [employer]. This information is not intended to replace advice given to you by your health care provider. Make sure you discuss any questions you have with your health care provider.  

## 2014-12-08 NOTE — Telephone Encounter (Addendum)
Called pt with [employer] # [phone]. I asked about the call we received from her last week and if her question or problem was fully remedied yesterday during her visit.  Pt stated that it was not. She needs Rx for test strips and lancets sent to [facility] on [address].  She has a true track meter. Pt also states that she has [employer].  I advised her that I will need to speak to the Diabetes Educator regarding her Rx. I will call her back. Pt then stated that she will be at the hospital tomorrow for [place] and can come to the clinic to pick up Rx. I agreed to the plan. **Will need to check with Izora Gala now that pt has insurance as she may require a different meter. Izora Gala is currently unavailable. I will speak with her in the morning and then provide information to pt when she arrives.   8/17  0815  Spoke w/Nancy Halpin RN and was advised to dispense Accu-chek Aviva Connect meter to pt from supply closet.  Rx for appropriate testing supplies e-prescribed to pt's pharmacy.  8/17  0825  Pt arrived to clinic and was given Accu-chek Aviva Connect meter. She was advised to take meter kit to her pharmacy so that the pharmacist could demonstrate how to use the lancing device. Pt also advised that new Rx for lancets and test strips would be sent to her pharmacy.  Pt voiced understanding of all information and instructions given.  Ladoris Gene present for Spanish translation.

## 2014-12-08 NOTE — Telephone Encounter (Signed)
Called patient with pacific interpreter [phone], no answer- left message stating we are trying to reach you to return your phone call, please call us back at the clinics

## 2014-12-09 ENCOUNTER — Ambulatory Visit (HOSPITAL_COMMUNITY)
Admission: RE | Admit: 2014-12-09 | Discharge: 2014-12-09 | Disposition: A | Discharge status: Home / Self Care | Payer: Medicaid Other | Source: Ambulatory Visit | Attending: Obstetrics & Gynecology | Admitting: Obstetrics & Gynecology

## 2014-12-09 ENCOUNTER — Encounter (HOSPITAL_COMMUNITY): Payer: Self-pay

## 2014-12-09 ENCOUNTER — Other Ambulatory Visit: Payer: Self-pay | Admitting: Obstetrics & Gynecology

## 2014-12-09 VITALS — BP 129/68 | HR 80 | Wt 249.0 lb

## 2014-12-09 DIAGNOSIS — Z1389 Encounter for screening for other disorder: Secondary | ICD-10-CM

## 2014-12-09 DIAGNOSIS — O09522 Supervision of elderly multigravida, second trimester: Secondary | ICD-10-CM | POA: Diagnosis present

## 2014-12-09 DIAGNOSIS — O24112 Pre-existing diabetes mellitus, type 2, in pregnancy, second trimester: Secondary | ICD-10-CM

## 2014-12-09 DIAGNOSIS — Z36 Encounter for antenatal screening of mother: Secondary | ICD-10-CM | POA: Insufficient documentation

## 2014-12-09 DIAGNOSIS — O99212 Obesity complicating pregnancy, second trimester: Secondary | ICD-10-CM | POA: Insufficient documentation

## 2014-12-09 DIAGNOSIS — Z363 Encounter for antenatal screening for malformations: Secondary | ICD-10-CM

## 2014-12-09 DIAGNOSIS — O4402 Placenta previa specified as without hemorrhage, second trimester: Secondary | ICD-10-CM

## 2014-12-09 DIAGNOSIS — O289 Unspecified abnormal findings on antenatal screening of mother: Secondary | ICD-10-CM

## 2014-12-09 DIAGNOSIS — Z3A18 18 weeks gestation of pregnancy: Secondary | ICD-10-CM | POA: Diagnosis not present

## 2014-12-09 DIAGNOSIS — O09529 Supervision of elderly multigravida, unspecified trimester: Secondary | ICD-10-CM | POA: Insufficient documentation

## 2014-12-09 MED ORDER — GLUCOSE BLOOD VI STRP: ORAL_STRIP | Status: DC

## 2014-12-09 MED ORDER — ACCU-CHEK FASTCLIX LANCETS MISC: 1.0000 [IU] | Freq: Four times a day (QID) | Status: DC

## 2014-12-09 NOTE — Progress Notes (Signed)
Appointment Date: 12/09/2014 DOB: May 13, 1975 Referring Provider: Lesly Dukes, MD Attending: Dr. Particia Nearing  Ms.  Lisa Moreno was seen for genetic counseling because of a maternal age of 39 y.o..   She was accompanied to the visit by her mother and her 25 year old son.  In summary:  Discussed AMA and age related risks  Discussed abnormal ultrasound findings and difficulty in determining prognosis and outcome for baby  Declined aneuploid screening for now, may consider at a later date  Declined invasive testing  She was counseled regarding maternal age and the association with risk for chromosome conditions due to nondisjunction.   We reviewed chromosomes, nondisjunction, and the associated 1 in 27 risk for fetal aneuploidy related to a maternal age of 39 y.o. at [redacted]w[redacted]d gestation.  She was counseled that the risk for aneuploidy decreases as gestational age increases, accounting for those pregnancies which spontaneously abort.  We specifically discussed Down syndrome (trisomy 86), trisomies 38 and 60, and sex chromosome aneuploidies (47,XXX and 47,XXY) including the common features and prognoses of each.   We reviewed available screening options including Quad screen, noninvasive prenatal screening (NIPS)/cell free DNA (cfDNA) testing, and detailed ultrasound.  She was counseled that screening tests are used to modify a patient's a priori risk for aneuploidy, typically based on age. This estimate provides a pregnancy specific risk assessment. We reviewed the benefits and limitations of each option. Specifically, we discussed the conditions for which each test screens, the detection rates, and false positive rates of each. She was also counseled regarding diagnostic testing via amniocentesis. We reviewed the approximate 1 in 300-500 risk for complications from amniocentesis, including spontaneous pregnancy loss.   A complete anatomy ultrasound was performed today. The ultrasound  report will be sent under separate cover.   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  As she had a normal MSAFP and there did not appear to be an open spinal defect, we discussed that this was not a typical spina bifida.  It is unclear if this is a curvature or due to a vertebral anomaly, such as a hemivertebra, or a different structural defect.  We discussed that the feet having a clubbed appearance may suggest that there is a neuromuscular difference because of the spinal difference.  She was counseled that while the feet may be corrected postnatally, a neuromuscular condition may cause continued limitations in ambulation.  She understands that this spinal difference may be part of an underlying genetic condition, which may be associated with other health and or learning conditions that are not identifiable prenatally.  While amniocentesis or NIPS may identify an underlying genetic condition, they could be normal as the genetic difference may not be detectable by either of those analyses.  We discussed other possible explanations for the above discussed ultrasound findings including single gene conditions or teratogenic exposures.  Lisa Moreno was counseled that the prognosis and postnatal management depend upon the underlying etiology of the fetal differences.   We then discussed the option of serial sonography and fetal echocardiogram.  She understands that an accurate discussion of prognosis is challenging, without additional information.  Both NIPS and diagnostic testing were declined today.  Lisa Moreno is already scheduled for fetal echocardiogram, and will be scheduled for a follow up ultrasound in our office.  Both family histories were reviewed and found to be noncontributory for birth defects, intellectual disability, and known genetic conditions.  Lisa Moreno reported that  her sister had complications with her pregnancy because  of Rh factor. We discussed that her sister likely was Rh negative and had antibodies, but that because Lisa Moreno is Rh positive, we would not expect her to have similar concerns.  Lisa Moreno reported that her first child, a son, has alopecia, skin allergies and lactose intolerance.  No specific diagnosis for his condition has been provided to her.  Lisa Moreno husband, Lisa Moreno, has many sibilings and Lisa Moreno had limited information on exactly how many, and which ones have the same mother and father as her husband.  In addition, his family is from a small village in [place] thus, the documentation on medical conditions is very limited.  However, Lisa Moreno believed that her husband had several siblings who died (may have been half sibilings) and a brother with a heart condition that requires him to live at either a high altitude or a low altitude.  Without further information regarding the provided family history, an accurate genetic risk cannot be calculated. Further genetic counseling is warranted if more information is obtained.  Lisa Moreno denied exposure to environmental toxins or chemical agents. She denied the use of alcohol, tobacco or street drugs. She denied significant viral illnesses during the course of her pregnancy. Her medical and surgical histories were contributory for diabetes.  We discussed the fact that women who have diabetes are at an increased risk to have a baby with a birth defect.  The increase in risk correlates with the level of blood sugar control during the pregnancy, particularly during organogenesis.  The increase in risk is for any type of birth defect but is greatest for heart, limb, and neural tube defects.  The risk could be as high as 6-10% for individuals whose blood sugars are not well-controlled, but lower for women who have good blood sugar control throughout pregnancy.  A recent A1C was not available in the  medical record to know how her blood sugar control had been in the early part of the pregnancy.     I counseled Lisa Moreno, with the assistance of a medical interpreter, regarding the above risks and available options.  The approximate face-to-face time with the genetic counselor was 90 minutes.  Lisa Gemma, MS,  Certified Genetic Counselor

## 2014-12-10 ENCOUNTER — Other Ambulatory Visit: Payer: Self-pay | Admitting: Obstetrics & Gynecology

## 2014-12-10 DIAGNOSIS — O24912 Unspecified diabetes mellitus in pregnancy, second trimester: Secondary | ICD-10-CM

## 2014-12-10 DIAGNOSIS — O4400 Placenta previa specified as without hemorrhage, unspecified trimester: Secondary | ICD-10-CM

## 2014-12-10 HISTORY — DX: Complete placenta previa nos or without hemorrhage, unspecified trimester: O44.00

## 2014-12-10 NOTE — Progress Notes (Signed)
Pt needs to come have Hgb A1c drawn. She needs to be told vaginal rest due to central previa.

## 2014-12-11 ENCOUNTER — Ambulatory Visit (INDEPENDENT_AMBULATORY_CARE_PROVIDER_SITE_OTHER): Payer: Medicaid Other | Admitting: Cardiology

## 2014-12-11 ENCOUNTER — Encounter: Payer: Self-pay | Admitting: Cardiology

## 2014-12-11 VITALS — BP 132/76 | HR 81 | Ht 63.0 in | Wt 245.0 lb

## 2014-12-11 DIAGNOSIS — I10 Essential (primary) hypertension: Secondary | ICD-10-CM

## 2014-12-11 DIAGNOSIS — R0609 Other forms of dyspnea: Secondary | ICD-10-CM | POA: Diagnosis not present

## 2014-12-11 DIAGNOSIS — R002 Palpitations: Secondary | ICD-10-CM | POA: Diagnosis not present

## 2014-12-11 NOTE — Progress Notes (Signed)
Patient ID: Lisa Moreno, female   DOB: 12-12-1975, 40 y.o.   MRN: [id number]      Cardiology Office Note  Date:  12/11/2014   ID:  Lisa Moreno, [place] [birth year], MRN [id number]  PCP:  Jeanann Lewandowsky, MD  Cardiologist: Lars Masson, MD   Chief complain:    History of Present Illness: Lisa Moreno is a 39 y.o. female who presents for evaluation of chest pain and dyspnea on exertion. She has developed worsening DOE during the pregnancy, currently in 98. Week. She has chest pain and neck pain for about a week and feels like her occipital area is burning. No syncope. She had hypertension prior to the pregnancy and was using lisinopril that was discontinued. Her BP gets high at the time but was ok on the last two doctors visits. She doesn't have BP cuff at home.  No LE edema, orthopnea, PND.    Past Medical History  Diagnosis Date  . Asthma   . Hypertension   . Diabetes mellitus without complication   . GERD (gastroesophageal reflux disease)     Past Surgical History  Procedure Laterality Date  . Breast reduction surgery    . Tonsillectomy       Current Outpatient Prescriptions  Medication Sig Dispense Refill  . ACCU-CHEK FASTCLIX LANCETS MISC 1 Units by Percutaneous route 4 (four) times daily. 100 each 12  . acetaminophen (TYLENOL) 500 MG tablet Take 1,500 mg by mouth daily.     Marland Kitchen albuterol (PROVENTIL HFA;VENTOLIN HFA) 108 (90 BASE) MCG/ACT inhaler Inhale 2 puffs into the lungs every 6 (six) hours as needed for wheezing. 1 Inhaler 11  . albuterol (PROVENTIL) (5 MG/ML) 0.5% nebulizer solution Take 2.5 mg by nebulization every 6 (six) hours as needed for wheezing or shortness of breath.    Marland Kitchen aspirin 81 MG tablet Take 1 tablet (81 mg total) by mouth daily. 30 tablet 12  . glucose blood test strip Use as instructed to check blood glucose 4 times daily. 100 each 12  . glyBURIDE (DIABETA) 2.5 MG tablet Take 1 tablet (2.5 mg total) by mouth  2 (two) times daily with a meal. 60 tablet 3  . metFORMIN (GLUCOPHAGE) 1000 MG tablet Take 1 tablet (1,000 mg total) by mouth at bedtime. 30 tablet 3  . metoCLOPramide (REGLAN) 10 MG tablet Take 1 tablet (10 mg total) by mouth every 6 (six) hours as needed for nausea. 30 tablet 2  . Prenatal Vit-Fe Fumarate-FA (PRENATAL MULTIVITAMIN) TABS tablet Take 1 tablet by mouth daily at 12 noon.    . ranitidine (ZANTAC) 150 MG tablet Take 1 tablet (150 mg total) by mouth 2 (two) times daily. 60 tablet 2  . senna (SENOKOT) 8.6 MG tablet Take 2 tablets by mouth at bedtime.      No current facility-administered medications for this visit.    Allergies:   Decadron    Social History:  The patient  reports that she has never smoked. She has never used smokeless tobacco. She reports that she does not drink alcohol or use illicit drugs.   Family History:  The patient's family history includes Depression in her mother; Diabetes in her father and mother; Hypertension in her mother; Kidney disease in her father; Stroke in her father and mother.    ROS:  Please see the history of present illness.   Otherwise, review of systems are positive for none.   All other systems are reviewed and  negative.    PHYSICAL EXAM: VS:  BP 132/76 mmHg  Pulse 81  Ht  (1.6 m)  Wt 245 lb (111.131 kg)  BMI 43.41 kg/m2  LMP 08/05/2014 , BMI Body mass index is 43.41 kg/(m^2). GEN: Well nourished, well developed, in no acute distress HEENT: normal Neck: no JVD, carotid bruits, or masses Cardiac: RRR; no murmurs, rubs, or gallops,no edema  Respiratory:  clear to auscultation bilaterally, normal work of breathing GI: soft, nontender, consistent with 2.trimester of pregnancy MS: no deformity or atrophy Skin: warm and dry, no rash Neuro:  Strength and sensation are intact Psych: euthymic mood, full affect   EKG:  EKG is not ordered today. The ekg ordered today demonstrates SR, iRBBB   Recent Labs: 03/30/2014: TSH  1.483 11/11/2014: ALT 15; BUN 9; Creat 0.43*; Hemoglobin 11.5*; Platelets 270; Potassium 4.0; Sodium 137    Lipid Panel    Component Value Date/Time   CHOL 148 03/30/2014 1547   TRIG 101 03/30/2014 1547   HDL 38* 03/30/2014 1547   CHOLHDL 3.9 03/30/2014 1547   VLDL 20 03/30/2014 1547   LDLCALC 90 03/30/2014 1547      Wt Readings from Last 3 Encounters:  12/11/14 245 lb (111.131 kg)  12/09/14 249 lb (112.946 kg)  12/07/14 247 lb 14.4 oz (112.447 kg)       ASSESSMENT AND PLAN:  1.  Chest pain - seems very atypical, most probably cervical radiculopathy, advised to use cold compresses and consider a chiropractor  2. DOE - we will get TTE to evaluate for systolic and diastolic function, also to assess coronary origin  3. Hypertension - currently normotensive - we will start labetalol if becomes hypertensive   Orders Placed This Encounter  Procedures  . Echocardiogram   Follow up in 1 month.  Signed, Lars Masson, MD  12/11/2014 2:55 PM    [facility] [address] [place], [place], [place]  [address] Phone: [phone]; Fax: [phone]

## 2014-12-11 NOTE — Patient Instructions (Signed)
Medication Instructions:   Your physician recommends that you continue on your current medications as directed. Please refer to the Current Medication list given to you today.       Testing/Procedures:  Your physician has requested that you have an echocardiogram. Echocardiography is a painless test that uses sound waves to create images of your heart. It provides your doctor with information about the size and shape of your heart and how well your heart's chambers and valves are working. This procedure takes approximately one hour. There are no restrictions for this procedure.     Follow-Up:  ONE MONTH WITH DR NELSON----OR AT VERY NEXT AVAILABLE AT THAT TIME.

## 2014-12-16 NOTE — Progress Notes (Signed)
Called patient with pacific interpreter [phone], no answer- left message stating we are trying to reach you; please call us back at the clinics

## 2014-12-16 NOTE — Progress Notes (Signed)
Patient called and left message stating she is returning our call. Called patient with pacific interpreter [phone], no answer- left message stating we are trying to reach you, please call us back at the clinics

## 2014-12-16 NOTE — Progress Notes (Signed)
Patient called back to front office. Talked to patient through Darl Pikes for interpreter. Discussed with patient that with the placenta previa, we advise nothing in the vagina/no intercourse. Patient verbalized understanding and states that's what they told her. Also discussed need to come in for blood test to determine how her blood sugars have been over past couple of months. Patient verbalized understanding and states she can come in tomorrow at 10am. Patient asked if the previa could cause additional pelvic pain. Told patient that is possible that it could and if she were to have bleeding or severe pain she needs to be seen. Patient verbalized understanding and asked if she could still take tylenol. Told patient she can continue tylenol. Patient verbalized understanding and had no other questions

## 2014-12-17 ENCOUNTER — Other Ambulatory Visit: Payer: Medicaid Other

## 2014-12-17 ENCOUNTER — Ambulatory Visit (HOSPITAL_COMMUNITY): Payer: Medicaid Other | Attending: Cardiology

## 2014-12-17 ENCOUNTER — Other Ambulatory Visit: Payer: Self-pay

## 2014-12-17 ENCOUNTER — Other Ambulatory Visit: Payer: Self-pay | Admitting: *Deleted

## 2014-12-17 DIAGNOSIS — I517 Cardiomegaly: Secondary | ICD-10-CM | POA: Insufficient documentation

## 2014-12-17 DIAGNOSIS — I071 Rheumatic tricuspid insufficiency: Secondary | ICD-10-CM | POA: Insufficient documentation

## 2014-12-17 DIAGNOSIS — I10 Essential (primary) hypertension: Secondary | ICD-10-CM

## 2014-12-17 DIAGNOSIS — R0609 Other forms of dyspnea: Secondary | ICD-10-CM

## 2014-12-17 DIAGNOSIS — O24912 Unspecified diabetes mellitus in pregnancy, second trimester: Secondary | ICD-10-CM

## 2014-12-17 DIAGNOSIS — Z3A19 19 weeks gestation of pregnancy: Secondary | ICD-10-CM | POA: Insufficient documentation

## 2014-12-17 DIAGNOSIS — R002 Palpitations: Secondary | ICD-10-CM

## 2014-12-17 DIAGNOSIS — Z6841 Body Mass Index (BMI) 40.0 and over, adult: Secondary | ICD-10-CM | POA: Insufficient documentation

## 2014-12-17 DIAGNOSIS — O24112 Pre-existing diabetes mellitus, type 2, in pregnancy, second trimester: Secondary | ICD-10-CM

## 2014-12-17 DIAGNOSIS — I340 Nonrheumatic mitral (valve) insufficiency: Secondary | ICD-10-CM | POA: Diagnosis not present

## 2014-12-17 DIAGNOSIS — K219 Gastro-esophageal reflux disease without esophagitis: Secondary | ICD-10-CM | POA: Diagnosis not present

## 2014-12-17 LAB — HEMOGLOBIN A1C
HEMOGLOBIN A1C: 5.9 % — AB (ref <5.7)
MEAN PLASMA GLUCOSE: 123 mg/dL — AB (ref <117)

## 2014-12-20 ENCOUNTER — Inpatient Hospital Stay (HOSPITAL_COMMUNITY): Payer: Medicaid Other

## 2014-12-20 ENCOUNTER — Inpatient Hospital Stay (HOSPITAL_COMMUNITY)
Admission: AD | Admit: 2014-12-20 | Discharge: 2014-12-20 | Disposition: A | Discharge status: Home / Self Care | Payer: Medicaid Other | Source: Ambulatory Visit | Attending: Obstetrics & Gynecology | Admitting: Obstetrics & Gynecology

## 2014-12-20 ENCOUNTER — Encounter (HOSPITAL_COMMUNITY): Payer: Self-pay | Admitting: *Deleted

## 2014-12-20 DIAGNOSIS — O4400 Placenta previa specified as without hemorrhage, unspecified trimester: Secondary | ICD-10-CM

## 2014-12-20 DIAGNOSIS — O26892 Other specified pregnancy related conditions, second trimester: Secondary | ICD-10-CM | POA: Insufficient documentation

## 2014-12-20 DIAGNOSIS — O359XX0 Maternal care for (suspected) fetal abnormality and damage, unspecified, not applicable or unspecified: Secondary | ICD-10-CM | POA: Diagnosis not present

## 2014-12-20 DIAGNOSIS — O4402 Placenta previa specified as without hemorrhage, second trimester: Secondary | ICD-10-CM | POA: Diagnosis not present

## 2014-12-20 DIAGNOSIS — O4401 Placenta previa specified as without hemorrhage, first trimester: Secondary | ICD-10-CM

## 2014-12-20 DIAGNOSIS — Z3A20 20 weeks gestation of pregnancy: Secondary | ICD-10-CM | POA: Diagnosis not present

## 2014-12-20 DIAGNOSIS — O26899 Other specified pregnancy related conditions, unspecified trimester: Secondary | ICD-10-CM

## 2014-12-20 DIAGNOSIS — R102 Pelvic and perineal pain: Secondary | ICD-10-CM | POA: Diagnosis present

## 2014-12-20 DIAGNOSIS — O4410 Placenta previa with hemorrhage, unspecified trimester: Secondary | ICD-10-CM | POA: Insufficient documentation

## 2014-12-20 DIAGNOSIS — M545 Low back pain: Secondary | ICD-10-CM | POA: Diagnosis present

## 2014-12-20 DIAGNOSIS — R109 Unspecified abdominal pain: Secondary | ICD-10-CM | POA: Diagnosis not present

## 2014-12-20 DIAGNOSIS — O359XX1 Maternal care for (suspected) fetal abnormality and damage, unspecified, fetus 1: Secondary | ICD-10-CM

## 2014-12-20 LAB — URINALYSIS, ROUTINE W REFLEX MICROSCOPIC
Bilirubin Urine: NEGATIVE
Glucose, UA: NEGATIVE mg/dL
Hgb urine dipstick: NEGATIVE
Ketones, ur: 15 mg/dL — AB
Leukocytes, UA: NEGATIVE
Nitrite: NEGATIVE
Protein, ur: 30 mg/dL — AB
Specific Gravity, Urine: >1.03 — ABNORMAL HIGH (ref 1.005–1.030)
Urobilinogen, UA: 1 mg/dL (ref 0.0–1.0)
pH: 5.5 (ref 5.0–8.0)

## 2014-12-20 LAB — URINE MICROSCOPIC-ADD ON

## 2014-12-20 LAB — WET PREP, GENITAL
Clue Cells Wet Prep HPF POC: NONE SEEN
Trich, Wet Prep: NONE SEEN
Yeast Wet Prep HPF POC: NONE SEEN

## 2014-12-20 MED ORDER — OXYCODONE-ACETAMINOPHEN 5-325 MG PO TABS: 1.0000 | ORAL_TABLET | Freq: Four times a day (QID) | ORAL | Status: DC | PRN

## 2014-12-20 NOTE — MAU Note (Addendum)
Pt C/O pelvic pressure x 4 days, more severe for the last 2 hours.  Noticed 2 spots of blood @ 1000.  Lower abd pain & rectal pressure.  Denies LOF.  Placenta previa.

## 2014-12-20 NOTE — Progress Notes (Signed)
Assisted Midwife with interpretation of discharge instructions and results of ultrasound.  Spanish Interpreter

## 2014-12-20 NOTE — Progress Notes (Signed)
TVUS recommended by Dr. Vincenza Hews.  OK'd per Sharen Counter, CNM. Patient refused TVUS.

## 2014-12-20 NOTE — Discharge Instructions (Signed)
Dolor abdominal en el embarazo (Abdominal Pain During Pregnancy) El dolor abdominal es frecuente durante el embarazo. Generalmente no causa ningn dao. El dolor abdominal puede tener numerosas causas. Algunas causas son ms graves que otras. Ciertas causas de dolor abdominal durante el embarazo se diagnostican fcilmente. A veces, se tarda un tiempo para llegar al diagnstico. Otras veces la causa no se conoce. El dolor abdominal puede estar relacionado con [place] alteracin del Myra, o puede deberse a una causa totalmente diferente. Por este motivo, siempre consulte a su mdico cuando sienta molestias abdominales. INSTRUCCIONES PARA EL CUIDADO EN EL HOGAR  Est atenta al dolor para ver si hay cambios. Las siguientes indicaciones ayudarn a Psychologist, educational [employer] pueda sentir:  No Chiropodist sexuales y no coloque nada dentro de la vagina hasta que los sntomas hayan desaparecido completamente.  Descanse todo lo que pueda [employer] dolor se le haya calmado.  Si siente nuseas, beba lquidos claros. Evite los alimentos slidos mientras sienta malestar o tenga nuseas.  Tome slo medicamentos de venta libre o recetados, segn las indicaciones del mdico.  Cumpla con todas las visitas de control, segn le indique su mdico. SOLICITE ATENCIN MDICA DE INMEDIATO SI:  Tiene un sangrado, prdida de lquidos o elimina tejidos por la vagina.  El dolor o los clicos [place].  Tiene vmitos persistentes.  Comienza a Financial risk analyst al orinar u [employer].  Tiene fiebre.  Nota que los movimientos del beb disminuyen.  Siente intensa debilidad o se marea.  Tiene dificultad para respirar con o sin dolor abdominal.  Siente un dolor de cabeza intenso junto al dolor abdominal.  S[place]le [place] secrecin vaginal anormal con dolor abdominal.  Tiene diarrea persistente.  El dolor abdominal sigue o empeora an despus de Field seismologist. ASEGRESE DE QUE:   Comprende estas  instrucciones.  Controlar su afeccin.  Recibir ayuda de inmediato si no mejora o si empeora. Document Released: 04/10/2005 Document Revised: 01/29/2013 [facility] Patient Information 2015 [place], [place]. This information is not intended to replace advice given to you by your health care provider. Make sure you discuss any questions you have with your health care provider. Hemorragia vaginal durante el embarazo (segundo trimestre) (Vaginal Bleeding During Pregnancy, Second Trimester) Durante el embarazo es relativamente frecuente que se presente una pequea hemorragia (manchas). Esta situacin generalmente mejora por s misma. Hay diversos factores que pueden causar hemorragia o Games developer. Algunas manchas pueden estar relacionadas al [employer] y otras no. A veces, la hemorragia es normal y no es un problema. Sin embargo, la hemorragia tambin puede ser un signo de algo grave. Debe informar a su mdico de inmediato si tiene alguna hemorragia vaginal. Algunas causas posibles de hemorragia vaginal durante el segundo trimestre incluyen:  Infeccin, inflamacin o crecimientos anormales en el cuello del tero.  La placenta cubre parcial o completamente la abertura del cuello del tero (placenta previa).  La placenta puede haberse separado del tero (abrupcin placentaria).  Usted puede estar sufriendo un trabajo de parto prematuro.  Es probable que el cuello del tero no sea lo suficientemente resistente para Pharmacologist un beb dentro del tero (insuficiencia cervical).  Se pueden haber desarrollado pequeos quistes en el tero en lugar de tejido de embarazo (embarazo molar). INSTRUCCIONES PARA EL CUIDADO EN EL HOGAR  Controle su afeccin para ver si hay cambios. Las siguientes indicaciones ayudarn a Psychologist, educational [employer] pueda sentir:  Siga las indicaciones del mdico para restringir su actividad. Si el mdico le indica descanso  en la cama, debe quedarse en la cama y  levantarse solo para ir al bao. No obstante, el mdico puede permitirle que continu con tareas livianas.  Si es necesario, organcese para que alguien le ayude con las actividades y responsabilidades cotidianas mientras est en cama.  Lleve un registro de la cantidad y la saturacin de las toallas higinicas que Landscape architect. Anote este dato.  No use tampones.No se haga duchas vaginales.  No tenga relaciones sexuales u orgasmos hasta que el mdico la autorice.  Si elimina tejido por la vagina, gurdelo para mostrrselo al [employer].  [place] solo medicamentos de venta libre o recetados, segn las indicaciones del mdico.  No tome aspirina, ya que puede causar hemorragias.  No realice ejercicio ni ninguna actividad intensa, ni levante objetos pesados sin el permiso de su mdico.  Cumpla con todas las visitas de control, segn le indique su mdico. SOLICITE ATENCIN MDICA SI:  Tiene un sangrado vaginal en cualquier momento del embarazo.  Tiene calambres o dolores de [place].  Tiene fiebre que los medicamentos no Sports coach. SOLICITE ATENCIN MDICA DE INMEDIATO SI:   Siente calambres intensos en la espalda o en el vientre (abdomen).  Siente contracciones.  Tiene escalofros.  Elimina cogulos grandes o tejido por la vagina.  La hemorragia aumenta.  Si se siente mareada, dbil o se desmaya.  Tiene una prdida importante o sale lquido a borbotones por la vagina. ASEGRESE DE QUE:  Comprende estas instrucciones.  Controlar su afeccin.  Recibir ayuda de inmediato si no mejora o si empeora. Document Released: 01/18/2005 Document Revised: 04/15/2013 [facility] Patient Information 2015 [place], [place]. This information is not intended to replace advice given to you by your health care provider. Make sure you discuss any questions you have with your health care provider.

## 2014-12-20 NOTE — MAU Provider Note (Signed)
Chief Complaint:  pelvic pressure    First Provider Initiated Contact with Patient 12/20/14 1323      HPI: Lisa Moreno is a 39 y.o. G2P1001 at [redacted]w[redacted]d who presents to maternity admissions reporting pelvic pressure and back pain x 4 days, worsening today. She also reports light spotting when wiping today.  She reports whenever she stands for a few minutes or walks, the pressure is unbearable. She reports she is taking 2,000 mg Tylenol occasionally for the pain. She took this dose this morning.  It only helps a little with her pain.  She has a known central placenta previa and spinal deformities noted on [place]. She reports good fetal movement, denies LOF, vaginal itching/burning, urinary symptoms, h/a, dizziness, n/v, or fever/chills.    Abdominal Pain This is a new problem. The current episode started in the past 7 days. The onset quality is gradual. The problem occurs intermittently. The problem has been waxing and waning. The pain is located in the generalized abdominal region. The pain is moderate. The quality of the pain is cramping. The abdominal pain radiates to the back. Pertinent negatives include no constipation, diarrhea, dysuria, fever, frequency, headaches, nausea or vomiting. She has tried nothing for the symptoms.    Past Medical History: Past Medical History  Diagnosis Date  . Asthma   . Hypertension   . Diabetes mellitus without complication   . GERD (gastroesophageal reflux disease)   . Gestational diabetes     Past obstetric history: OB History  Gravida Para Term Preterm AB SAB TAB Ectopic Multiple Living  0 0 0 0 0 0 1    # Outcome Date GA Lbr Len/2nd Weight Sex Delivery Anes PTL Lv  2 Current           1 Term 05/25/06 [redacted]w[redacted]d  7 lb 1 oz (3.204 kg) M Vag-Spont EPI N Y     Complications: Gestational diabetes      Past Surgical History: Past Surgical History  Procedure Laterality Date  . Breast reduction surgery    . Tonsillectomy      Family  History: Family History  Problem Relation Age of Onset  . Diabetes Mother   . Stroke Mother   . Hypertension Mother   . Depression Mother   . Diabetes Father   . Stroke Father   . Kidney disease Father     Social History: Social History  Substance Use Topics  . Smoking status: Never Smoker   . Smokeless tobacco: Never Used  . Alcohol Use: No    Allergies:  Allergies  Allergen Reactions  . Decadron [Dexamethasone] Other (See Comments)    Intense burning vaginal/rectal area.     Meds:  No prescriptions prior to admission    ROS:  Review of Systems  Constitutional: Negative for fever, chills and fatigue.  HENT: Negative for sinus pressure.   Eyes: Negative for photophobia.  Respiratory: Negative for shortness of breath.   Cardiovascular: Negative for chest pain.  Gastrointestinal: Positive for abdominal pain. Negative for nausea, vomiting, diarrhea and constipation.  Genitourinary: Negative for dysuria, frequency, flank pain, vaginal bleeding, vaginal discharge, difficulty urinating, vaginal pain and pelvic pain.  Musculoskeletal: Negative for neck pain.  Neurological: Negative for dizziness, weakness and headaches.  Psychiatric/Behavioral: Negative.      I have reviewed patient's Past Medical Hx, Surgical Hx, Family Hx, Social Hx, medications and allergies.   Physical Exam   Patient Vitals for the past 24 hrs:  BP  Temp Temp src Pulse Resp  12/20/14 1621 118/62 mmHg - - 76 18  12/20/14 1228 128/77 mmHg - - 78 -  12/20/14 1200 126/95 mmHg 98.6 F (37 C) Oral 76 20   Constitutional: Well-developed, well-nourished female in no acute distress.  Cardiovascular: normal rate Respiratory: normal effort GI: Abd soft, non-tender, gravid appropriate for gestational age.  MS: Extremities nontender, no edema, normal ROM Neurologic: Alert and oriented x 4.  GU: Neg CVAT.  PELVIC EXAM: Gentle speculum exam performed.  Poor visualization of cervix with pt discomfort  during exam but no bleeding noted with creamy white discharge only, and vaginal walls pink, normal    FHT:  152 by doppler   Labs: Results for orders placed or performed during the hospital encounter of 12/20/14 (from the past 24 hour(s))  Urinalysis, Routine w reflex microscopic (not at [facility])     Status: Abnormal   Collection Time: 12/20/14  1:30 PM  Result Value Ref Range   Color, Urine YELLOW YELLOW   APPearance CLEAR CLEAR   Specific Gravity, Urine >1.030 (H) 1.005 - 1.030   pH 5.5 5.0 - 8.0   Glucose, UA NEGATIVE NEGATIVE mg/dL   Hgb urine dipstick NEGATIVE NEGATIVE   Bilirubin Urine NEGATIVE NEGATIVE   Ketones, ur 15 (A) NEGATIVE mg/dL   Protein, ur 30 (A) NEGATIVE mg/dL   Urobilinogen, UA 1.0 0.0 - 1.0 mg/dL   Nitrite NEGATIVE NEGATIVE   Leukocytes, UA NEGATIVE NEGATIVE  Urine microscopic-add on     Status: Abnormal   Collection Time: 12/20/14  1:30 PM  Result Value Ref Range   Squamous Epithelial / LPF FEW (A) RARE   WBC, UA 0-2 <3 WBC/hpf   Bacteria, UA FEW (A) RARE   Crystals CA OXALATE CRYSTALS (A) NEGATIVE   Urine-Other MUCOUS PRESENT   Wet prep, genital     Status: Abnormal   Collection Time: 12/20/14  1:40 PM  Result Value Ref Range   Yeast Wet Prep HPF POC NONE SEEN NONE SEEN   Trich, Wet Prep NONE SEEN NONE SEEN   Clue Cells Wet Prep HPF POC NONE SEEN NONE SEEN   WBC, Wet Prep HPF POC FEW (A) NONE SEEN   O/POS/-- (07/18 1704)  Imaging:  Limited OB US with preliminary findings: Central previa still noted, FHR normal, amniotic fluid normal, small  2.6 x 4.1 x 2.8 hemorrhage at posterior edge of placenta  MAU Course/MDM: I have ordered labs and reviewed results.  Consult Dr Debroah Loop to review assessment, labs, and imaging. Small hemorrhage on [place] to be followed along with previa, no change in prenatal care plan at this time.  Pt stable at time of discharge.  Assessment: 1. Placenta previa specified as without hemorrhage in second trimester   2. Placenta  previa antepartum, first trimester   3. Abdominal pain affecting pregnancy   4. Placenta previa centralis   5. [redacted] weeks gestation of pregnancy   6. Prior exam suggested fetal anomaly, antepartum, fetus 1     Plan: Discharge home with bleeding precautions Preterm labor precautions and fetal kick counts Percocet 5/325, take 1-2 Q 6 hours PRN x 15 tabs Discussed recommended limits to Tylenol daily and per dose Rest/ice/heat/warm bath for pain      Follow-up Information    Follow up with [facility].   Specialty:  Obstetrics and Gynecology   Why:  As scheduled   Contact information:   [address] [place] [address] [phone]  Follow up with [facility] [place] [facility].   Why:  As needed for emergencies   Contact information:   [address] [id number] mc [place] [address] [phone]       Medication List    TAKE these medications        ACCU-CHEK FASTCLIX LANCETS Misc  1 Units by Percutaneous route 4 (four) times daily.     acetaminophen 500 MG tablet  Commonly known as:  TYLENOL  Take 1,500 mg by mouth daily.     albuterol (5 MG/ML) 0.5% nebulizer solution  Commonly known as:  PROVENTIL  Take 2.5 mg by nebulization every 6 (six) hours as needed for wheezing or shortness of breath.     albuterol 108 (90 BASE) MCG/ACT inhaler  Commonly known as:  PROVENTIL HFA;VENTOLIN HFA  Inhale 2 puffs into the lungs every 6 (six) hours as needed for wheezing.     aspirin 81 MG tablet  Take 1 tablet (81 mg total) by mouth daily.     folic acid 1 MG tablet  Commonly known as:  FOLVITE  Take 1 mg by mouth daily.     glucose blood test strip  Use as instructed to check blood glucose 4 times daily.     glyBURIDE 2.5 MG tablet  Commonly known as:  DIABETA  Take 1 tablet (2.5 mg total) by mouth 2 (two) times daily with a meal.     metFORMIN 1000 MG tablet  Commonly known as:   GLUCOPHAGE  Take 1 tablet (1,000 mg total) by mouth at bedtime.     metoCLOPramide 10 MG tablet  Commonly known as:  REGLAN  Take 1 tablet (10 mg total) by mouth every 6 (six) hours as needed for nausea.     oxyCODONE-acetaminophen 5-325 MG per tablet  Commonly known as:  PERCOCET/ROXICET  Take 1-2 tablets by mouth every 6 (six) hours as needed for severe pain.     prenatal multivitamin Tabs tablet  Take 1 tablet by mouth daily at 12 noon.     ranitidine 150 MG tablet  Commonly known as:  ZANTAC  Take 1 tablet (150 mg total) by mouth 2 (two) times daily.     senna 8.6 MG tablet  Commonly known as:  SENOKOT  Take 2 tablets by mouth at bedtime.        Sharen Counter Certified Nurse-Midwife 12/21/2014 8:32 AM

## 2015-01-04 ENCOUNTER — Ambulatory Visit (INDEPENDENT_AMBULATORY_CARE_PROVIDER_SITE_OTHER): Payer: Medicaid Other | Admitting: Obstetrics & Gynecology

## 2015-01-04 VITALS — BP 140/71 | HR 74 | Temp 98.2°F | Wt 246.6 lb

## 2015-01-04 DIAGNOSIS — O0992 Supervision of high risk pregnancy, unspecified, second trimester: Secondary | ICD-10-CM

## 2015-01-04 LAB — POCT URINALYSIS DIP (DEVICE)
BILIRUBIN URINE: NEGATIVE
GLUCOSE, UA: NEGATIVE mg/dL
HGB URINE DIPSTICK: NEGATIVE
KETONES UR: 15 mg/dL — AB
LEUKOCYTES UA: NEGATIVE
Nitrite: NEGATIVE
Protein, ur: NEGATIVE mg/dL
Urobilinogen, UA: 1 mg/dL (ref 0.0–1.0)
pH: 5.5 (ref 5.0–8.0)

## 2015-01-04 MED ORDER — PANTOPRAZOLE SODIUM 40 MG PO TBEC: 40.0000 mg | DELAYED_RELEASE_TABLET | Freq: Every day | ORAL | Status: DC

## 2015-01-04 MED ORDER — DOCUSATE SODIUM 100 MG PO CAPS: 100.0000 mg | ORAL_CAPSULE | Freq: Two times a day (BID) | ORAL | Status: DC | PRN

## 2015-01-04 NOTE — Patient Instructions (Addendum)
Second Trimester of Pregnancy The second trimester is from week 13 through week 28, months 4 through 6. The second trimester is often a time when you feel your best. Your body has also adjusted to being pregnant, and you begin to feel better physically. Usually, morning sickness has lessened or quit completely, you may have more energy, and you may have an increase in appetite. The second trimester is also a time when the fetus is growing rapidly. At the end of the sixth month, the fetus is about 9 inches long and weighs about 1 pounds. You will likely begin to feel the baby move (quickening) between 18 and 20 weeks of the pregnancy. BODY CHANGES Your body goes through many changes during pregnancy. The changes vary from woman to woman.   Your weight will continue to increase. You will notice your lower abdomen bulging out.  You may begin to get stretch marks on your hips, abdomen, and breasts.  You may develop headaches that can be relieved by medicines approved by your health care provider.  You may urinate more often because the fetus is pressing on your bladder.  You may develop or continue to have heartburn as a result of your pregnancy.  You may develop constipation because certain hormones are causing the muscles that push waste through your intestines to slow down.  You may develop hemorrhoids or swollen, bulging veins (varicose veins).  You may have back pain because of the weight gain and pregnancy hormones relaxing your joints between the bones in your pelvis and as a result of a shift in weight and the muscles that support your balance.  Your breasts will continue to grow and be tender.  Your gums may bleed and may be sensitive to brushing and flossing.  Dark spots or blotches (chloasma, mask of pregnancy) may develop on your face. This will likely fade after the baby is born.  A dark line from your belly button to the pubic area (linea nigra) may appear. This will likely fade  after the baby is born.  You may have changes in your hair. These can include thickening of your hair, rapid growth, and changes in texture. Some women also have hair loss during or after pregnancy, or hair that feels dry or thin. Your hair will most likely return to normal after your baby is born. WHAT TO EXPECT AT YOUR PRENATAL VISITS During a routine prenatal visit:  You will be weighed to make sure you and the fetus are growing normally.  Your blood pressure will be taken.  Your abdomen will be measured to track your baby's growth.  The fetal heartbeat will be listened to.  Any test results from the previous visit will be discussed. Your health care provider may ask you:  How you are feeling.  If you are feeling the baby move.  If you have had any abnormal symptoms, such as leaking fluid, bleeding, severe headaches, or abdominal cramping.  If you have any questions. Other tests that may be performed during your second trimester include:  Blood tests that check for:  Low iron levels (anemia).  Gestational diabetes (between 24 and 28 weeks).  Rh antibodies.  Urine tests to check for infections, diabetes, or protein in the urine.  An ultrasound to confirm the proper growth and development of the baby.  An amniocentesis to check for possible genetic problems.  Fetal screens for spina bifida and Down syndrome. HOME CARE INSTRUCTIONS   Avoid all smoking, herbs, alcohol, and unprescribed   drugs. These chemicals affect the formation and growth of the baby.  Follow your health care provider's instructions regarding medicine use. There are medicines that are either safe or unsafe to take during pregnancy.  Exercise only as directed by your health care provider. Experiencing uterine cramps is a good sign to stop exercising.  Continue to eat regular, healthy meals.  Wear a good support bra for breast tenderness.  Do not use hot tubs, steam rooms, or saunas.  Wear your  seat belt at all times when driving.  Avoid raw meat, uncooked cheese, cat litter boxes, and soil used by cats. These carry germs that can cause birth defects in the baby.  Take your prenatal vitamins.  Try taking a stool softener (if your health care provider approves) if you develop constipation. Eat more high-fiber foods, such as fresh vegetables or fruit and whole grains. Drink plenty of fluids to keep your urine clear or pale yellow.  Take warm sitz baths to soothe any pain or discomfort caused by hemorrhoids. Use hemorrhoid cream if your health care provider approves.  If you develop varicose veins, wear support hose. Elevate your feet for 15 minutes, 3-4 times a day. Limit salt in your diet.  Avoid heavy lifting, wear low heel shoes, and practice good posture.  Rest with your legs elevated if you have leg cramps or low back pain.  Visit your dentist if you have not gone yet during your pregnancy. Use a soft toothbrush to brush your teeth and be gentle when you floss.  A sexual relationship may be continued unless your health care provider directs you otherwise.  Continue to go to all your prenatal visits as directed by your health care provider. SEEK MEDICAL CARE IF:   You have dizziness.  You have mild pelvic cramps, pelvic pressure, or nagging pain in the abdominal area.  You have persistent nausea, vomiting, or diarrhea.  You have a bad smelling vaginal discharge.  You have pain with urination. SEEK IMMEDIATE MEDICAL CARE IF:   You have a fever.  You are leaking fluid from your vagina.  You have spotting or bleeding from your vagina.  You have severe abdominal cramping or pain.  You have rapid weight gain or loss.  You have shortness of breath with chest pain.  You notice sudden or extreme swelling of your face, hands, ankles, feet, or legs.  You have not felt your baby move in over an hour.  You have severe headaches that do not go away with  medicine.  You have vision changes. Document Released: 04/04/2001 Document Revised: 04/15/2013 Document Reviewed: 06/11/2012 [facility] Patient Information 2015 [place], [place]. This information is not intended to replace advice given to you by your health care provider. Make sure you discuss any questions you have with your health care provider. Dolor de Merchandiser, retail  (Back Pain in Pregnancy)  El dolor de espalda es habitual durante el embarazo. Ocurre en aproximadamente la mitad de todos los [place]. Es importante para usted y su beb que permanezca activa durante el [place].Si siente que Chief Technology Officer de espalda es lo que no le permite mantenerse activa o dormir bien, Scientist, clinical (histocompatibility and immunogenetics) a su mdico. La causa del dolor de espalda puede deberse a varios factores relacionados con los cambios durante el [place].Afortunadamente, excepto que haya tenido problemas de espalda antes del [place], es probable que el dolor mejore despus del [place]. El dolor lumbar por lo general ocurre entre el quinto y sptimo mes del Psychiatrist. Sin  embargo, puede ocurrir [employer] primeros meses. Otros factores que aumentan el riesgo son:   Problemas previos en la espalda.  Lesiones en la espalda.  Tener gemelos o embarazos mltiples.  Tos persistente.  El estrs.  Movimientos repetitivos relacionados con Kathie Dike.  Enfermedad muscular o de la columna vertebral en la espalda.  Antecedentes familiares de problemas de espalda, rotura (hernia) de discos u osteoporosis.  Depresin, ansiedad y crisis de [place]. CAUSAS   En las embarazadas, el cuerpo produce una hormona llamada relaxina. Esta hormona hace que los ligamentos que conectan la zona lumbar y los huesos del pubis sean ms flexibles. Esta flexibilidad permite que el beb nazca con ms facilidad. Cuando los ligamentos estn relajados, los msculos tienen que trabajar ms para apoyar la espalda. El dolor en la espalda puede deberse al cansancio  muscular. El dolor tambin puede tener su causa en la irritacin de los tejidos de a espalda que se irritan ya que estn recibiendo menos apoyo.  A medida que el beb crece, ejerce presin [employer] nervios y los vasos sanguneos de la pelvis. Esto causa dolor de espalda.  A medida que el beb crece y [address] durante el [place], el tero presiona los msculos del estmago hacia adelante y [place] su centro de gravedad. Esto hace que los msculos de la espalda deban trabajar ms para mantener una buena [place]. SNTOMAS  Dolor lumbar durante el embarazo Generalmente se produce en la zona o por arriba de la cintura en el centro de la espalda. Puede haber dolor y entumecimiento que se irradia hacia la pierna o el pie. Es similar al dolor de espalda baja experimentada por las mujeres no embarazadas. Por lo general, aumenta al [employer] de pie o sentada por largos perodos de [place], o con levantamientos repetitivos Tambin puede haber sensibilidad en los msculos en la zona superior de la espalda .  Dolor plvico posterior Environmental consultant en la parte posterior de la pelvis es ms frecuente que el dolor lumbar en el embarazo. Se trata de un dolor profundo que se siente a un lado en la cintura, o a travs del cxis (sacro), o en ambos lugares. Puede sentir dolor en uno o ambos lados Este dolor tambin puede sentirse en las nalgas y el dorso de los muslos [place]in puede haber dolor pbico y en la ingle. El dolor no se mejora rpidamente con el reposo, y [place] puede haber rigidez matutina. Muchas actividades pueden causarlo. Un buen estado fsico antes y [address] puede o no prevenir este problema. Las contracciones del parto suelen aparecer cada 1 a 2 minutos, tienen una duracin de aproximadamente 1 minuto, e implica una sensacin de empujar o presin en la pelvis. Sin embargo, si usted est a trmino con Firefighter, Chief Technology Officer constante en la zona lumbar puede indicar el comienzo de  un parto prematuro, y usted debe ser consciente de ello.  DIAGNSTICO  No se deben tomar radiografas de la [employer] las primeras 12 a 14 semanas del embarazo y durante el resto del Psychiatrist, slo cuando sea absolutamente necesario. La resonancia magntica no emite radiacin y es un estudio seguro durante el Psychiatrist. Pero tambin se deben hacer solamente cuando sea absolutamente necesario.  INSTRUCCIONES PARA EL CUIDADO EN EL HOGAR   Realice actividad fsica segn las indicaciones del mdico. El ejercicio es la manera ms eficaz para prevenir o tratar Chief Technology Officer de espalda. Si tiene un problema en la espalda, es especialmente importante  evitar los deportes que requieran de movimientos corporales rpidos. La natacin y las caminatas son las mejores [address].  No permanezca sentada o de pie en el mismo lugar durante largos perodos.  No use tacos altos.  Sintese en la silla con una buena postura. Use una almohada en su espalda baja si es necesario. Asegrese de que su cabeza descansa sobre sus hombros y no est colgando hacia delante.  Trate de dormir de lado, de preferencia el lado izquierdo, con una o [employer] piernas. Si est dolorida despus de una noche de descanso, la cama puede ser [employer].Trate de colocar una tabla entre el colchn y el somier.  Prstele atencin a su cuerpo cuando se levante.Si siente dolor,pida ayuda o trate de doblar las rodillas ms para [employer] de las piernas en lugar de los msculos de la espalda. Pngase en cuclillas al levantar algo del suelo. No se doble.  Consuma una dieta saludable. Trate de aumentar de peso dentro de las recomendaciones de su mdico.  Utilice compresas de calor o fro de 3 a 4 veces al da durante 15 minutos para Primary school teacher.  Solo tome medicamentos que se pueden comprar sin receta o recetados para Chief Technology Officer, Dentist o fiebre, como le indica el mdico. Dolor de espaldas repentino  (agudo).  Haga reposo en cama slo en caso de los episodios ms extremos y agudos de Engineer, mining. El reposo prolongado en cama de ms de 48 horas agravar su trastorno.  El hielo es muy efectivo en los problemas agudos.  Ponga el hielo en una bolsa plstica.  Colquese una toalla entre la piel y la bolsa de hielo.  Deje el hielo durante 10 a 20 minutos cada 2 horas o segn lo nesecite, mientras se encuentre despierta.  Las compresas de calor durante 30 minutos antes de las actividades tambin puede ayudar. Dolor crnico en la espalda. Consulte a su mdico si el dolor es continuo. El mdico podr ayudarla o derivarla para que realice los ejercicios y trabajos de fortalecimiento apropiados. Con un buen entrenamiento fsico, podr evitar la mayor parte de los [place]. En algunos casos, la causa es un problema ms grave. Debe ser controlada inmediatamente si aparecen nuevos problemas. El mdico tambin podr recomendar:   Una faja de maternidad.  Un arns elstico.  Un cors para la espalda.  Un masajista o acupuntura. SOLICITE ATENCIN MDICA SI:   No puede Careers information officer de sus actividades diarias, an tomando los medicamentos para Psychologist, occupational.  Beverlee Nims ser derivada a un fisioterapeuta o quiroprxico.  Beverlee Nims intentar con acupuntura. SOLICITE ATENCIN MDICA DE INMEDIATO SI:   Siente entumecimiento, hormigueo, debilidad o problemas con el uso de los brazos o las piernas.  Siente un dolor de espalda muy intenso que no se alivia con medicamentos.  Tiene modificaciones repentinas en el control de la vejiga o el intestino.  Aumenta el dolor en otras partes del cuerpo.  Siente que le falta el aire, se marea o sufre un [place].  Tiene nuseas, vmitos o sudoracin.  Siente un dolor en la espalda similar al del [place] de [place].  Cuando aparece [employer], rompe la bolsa de aguas o tiene un sangrado vaginal.  El dolor o el adormecimiento se extienden  hacia la pierna.  El dolor aparece despus de una cada.  Siente dolor de un solo lado. Podra tener clculos renales.  Observa sangre en la orina. Podra tener una infeccin en la vejiga  o clculos renales.  Siente dolor y aparecen ronchas. Podra tener culebrilla. El dolor de espalda es bastante frecuente durante el embarazo pero no debe aceptarse slo como parte del [place]. Siempre debe tratarse lo ms rpidamente posible. Har que su embarazo sea lo ms placentero posible.  Document Released: 12/21/2010 Document Revised: 07/03/2011 [facility] Patient Information 2015 [place], [place]. This information is not intended to replace advice given to you by your health care provider. Make sure you discuss any questions you have with your health care provider.

## 2015-01-04 NOTE — Progress Notes (Signed)
Breastfeeding tip of the week reviewed Pt declined flu vaccine Pt states she is taking Zantac 3x daily and needs refill [place] 15 Home Medicaid form completed

## 2015-01-04 NOTE — Progress Notes (Signed)
Subjective: back pain   Lisa Moreno is a 39 y.o. G2P1001 at [redacted]w[redacted]d being seen today for ongoing prenatal care.  Patient reports heartburn and back pain.  Contractions: Not present.  Vag. Bleeding: None. Movement: Present. Denies leaking of fluid.   The following portions of the patient's history were reviewed and updated as appropriate: allergies, current medications, past family history, past medical history, past social history, past surgical history and problem list.   Objective:   Filed Vitals:   01/04/15 0850  BP: 140/71  Pulse: 74  Temp: 98.2 F (36.8 C)  Weight: 246 lb 9.6 oz (111.857 kg)    Fetal Status: Fetal Heart Rate (bpm): 143   Movement: Present     General:  Alert, oriented and cooperative. Patient is in no acute distress.  Skin: Skin is warm and dry. No rash noted.   Cardiovascular: Normal heart rate noted  Respiratory: Normal respiratory effort, no problems with respiration noted  Abdomen: Soft, gravid, appropriate for gestational age. Pain/Pressure: Present     Pelvic: Vag. Bleeding: None     Cervical exam deferred        Extremities: Normal range of motion.  Edema: None  Mental Status: Normal mood and affect. Normal behavior. Normal judgment and thought content.   Urinalysis: Urine Protein: Negative Urine Glucose: Negative  Assessment and Plan:  Pregnancy: G2P1001 at [redacted]w[redacted]d  1. Supervision of high risk pregnancy, antepartum, second trimester. DM States nl BS at home but did not bring her book Fetal spine anomaly and club feet, [place] f/u in 2 days Heartburn Hemorrhoid - POCT urinalysis dip (device) - docusate sodium (COLACE) 100 MG capsule; Take 1 capsule (100 mg total) by mouth 2 (two) times daily as needed.  Dispense: 30 capsule; Refill: 2 - pantoprazole (PROTONIX) 40 MG tablet; Take 1 tablet (40 mg total) by mouth daily.  Dispense: 30 tablet; Refill: 4  Preterm labor symptoms and general obstetric precautions including but not limited to vaginal  bleeding, contractions, leaking of fluid and fetal movement were reviewed in detail with the patient. Please refer to After Visit Summary for other counseling recommendations.  Return in about 2 weeks (around 01/18/2015).   Adam Phenix, MD

## 2015-01-06 ENCOUNTER — Encounter (HOSPITAL_COMMUNITY): Payer: Self-pay

## 2015-01-06 ENCOUNTER — Other Ambulatory Visit (HOSPITAL_COMMUNITY): Payer: Self-pay | Admitting: Maternal and Fetal Medicine

## 2015-01-06 ENCOUNTER — Encounter (HOSPITAL_COMMUNITY): Payer: Self-pay | Admitting: Obstetrics & Gynecology

## 2015-01-06 ENCOUNTER — Ambulatory Visit (HOSPITAL_COMMUNITY)
Admission: RE | Admit: 2015-01-06 | Discharge: 2015-01-06 | Disposition: A | Discharge status: Home / Self Care | Payer: Medicaid Other | Source: Ambulatory Visit | Attending: Obstetrics & Gynecology | Admitting: Obstetrics & Gynecology

## 2015-01-06 VITALS — BP 135/61 | HR 82 | Wt 244.2 lb

## 2015-01-06 DIAGNOSIS — O359XX0 Maternal care for (suspected) fetal abnormality and damage, unspecified, not applicable or unspecified: Secondary | ICD-10-CM

## 2015-01-06 DIAGNOSIS — O289 Unspecified abnormal findings on antenatal screening of mother: Secondary | ICD-10-CM

## 2015-01-06 DIAGNOSIS — O358XX0 Maternal care for other (suspected) fetal abnormality and damage, not applicable or unspecified: Secondary | ICD-10-CM | POA: Diagnosis present

## 2015-01-06 DIAGNOSIS — O24312 Unspecified pre-existing diabetes mellitus in pregnancy, second trimester: Secondary | ICD-10-CM

## 2015-01-06 DIAGNOSIS — O09522 Supervision of elderly multigravida, second trimester: Secondary | ICD-10-CM

## 2015-01-06 DIAGNOSIS — O4402 Placenta previa specified as without hemorrhage, second trimester: Secondary | ICD-10-CM

## 2015-01-06 DIAGNOSIS — E669 Obesity, unspecified: Secondary | ICD-10-CM | POA: Diagnosis not present

## 2015-01-06 DIAGNOSIS — O99212 Obesity complicating pregnancy, second trimester: Secondary | ICD-10-CM

## 2015-01-06 DIAGNOSIS — O24112 Pre-existing diabetes mellitus, type 2, in pregnancy, second trimester: Secondary | ICD-10-CM

## 2015-01-06 DIAGNOSIS — Z3A22 22 weeks gestation of pregnancy: Secondary | ICD-10-CM | POA: Diagnosis not present

## 2015-01-11 ENCOUNTER — Telehealth: Payer: Self-pay

## 2015-01-11 NOTE — Telephone Encounter (Signed)
Spainish Interpreter Delphina Cahill, pt LM that she is having pressure in her abd.  She walks 20 mins to get to her transportation.   Can she get a work letter?  Called pt and pt had questions on how she can obtain a letter because she had to move she is having troubles with child's school and getting transportation for him to get to/from school.  I advised pt that we can write a work restriction letter and also including that she is pregnant who receives prenatal care from our practice.  Pt stated understanding and that it would be sufficient.  Pt requests to pick up letter later this afternoon.  I informed pt that it would be prepared and ready for her to pick up.  Pt stated "thank you".

## 2015-01-18 ENCOUNTER — Ambulatory Visit (INDEPENDENT_AMBULATORY_CARE_PROVIDER_SITE_OTHER): Payer: Medicaid Other | Admitting: Family Medicine

## 2015-01-18 ENCOUNTER — Encounter: Payer: Self-pay | Admitting: Family Medicine

## 2015-01-18 VITALS — BP 127/73 | HR 77 | Wt 245.8 lb

## 2015-01-18 DIAGNOSIS — E1142 Type 2 diabetes mellitus with diabetic polyneuropathy: Secondary | ICD-10-CM

## 2015-01-18 DIAGNOSIS — O24112 Pre-existing diabetes mellitus, type 2, in pregnancy, second trimester: Secondary | ICD-10-CM

## 2015-01-18 DIAGNOSIS — O4412 Placenta previa with hemorrhage, second trimester: Secondary | ICD-10-CM

## 2015-01-18 DIAGNOSIS — O289 Unspecified abnormal findings on antenatal screening of mother: Secondary | ICD-10-CM

## 2015-01-18 DIAGNOSIS — O0992 Supervision of high risk pregnancy, unspecified, second trimester: Secondary | ICD-10-CM | POA: Diagnosis not present

## 2015-01-18 DIAGNOSIS — E119 Type 2 diabetes mellitus without complications: Secondary | ICD-10-CM | POA: Diagnosis not present

## 2015-01-18 DIAGNOSIS — O169 Unspecified maternal hypertension, unspecified trimester: Secondary | ICD-10-CM

## 2015-01-18 DIAGNOSIS — O4402 Placenta previa specified as without hemorrhage, second trimester: Secondary | ICD-10-CM

## 2015-01-18 DIAGNOSIS — O09522 Supervision of elderly multigravida, second trimester: Secondary | ICD-10-CM | POA: Diagnosis not present

## 2015-01-18 LAB — POCT URINALYSIS DIP (DEVICE)
Glucose, UA: NEGATIVE mg/dL
Hgb urine dipstick: NEGATIVE
KETONES UR: 15 mg/dL — AB
Leukocytes, UA: NEGATIVE
Nitrite: NEGATIVE
PH: 5.5 (ref 5.0–8.0)
PROTEIN: NEGATIVE mg/dL
SPECIFIC GRAVITY, URINE: 1.025 (ref 1.005–1.030)
Urobilinogen, UA: 1 mg/dL (ref 0.0–1.0)

## 2015-01-18 MED ORDER — CYCLOBENZAPRINE HCL 5 MG PO TABS: 5.0000 mg | ORAL_TABLET | Freq: Three times a day (TID) | ORAL | Status: DC | PRN

## 2015-01-18 MED ORDER — GABAPENTIN 300 MG PO CAPS: 300.0000 mg | ORAL_CAPSULE | Freq: Every day | ORAL | Status: DC

## 2015-01-18 MED ORDER — OXYCODONE-ACETAMINOPHEN 5-325 MG PO TABS: 1.0000 | ORAL_TABLET | Freq: Four times a day (QID) | ORAL | Status: DC | PRN

## 2015-01-18 NOTE — Patient Instructions (Addendum)
Toma metfomin todo los dias No tienes ques tomar la Glyburide si usted no come [employer] cena  [facility] trimestre de Psychiatrist (Second Trimester of Pregnancy) El segundo trimestre va desde la semana13 hasta la 28, desde el cuarto hasta el sexto mes, y suele ser el momento en el que mejor se siente. Su organismo se ha adaptado a Charity fundraiser y comienza a Diplomatic Services operational officer. En general, las nuseas matutinas han disminuido o han desaparecido completamente, p El segundo trimestre es tambin la poca en la que el feto se desarrolla rpidamente. Hacia el final del sexto mes, el feto mide aproximadamente 9pulgadas (23cm) y pesa alrededor de 1 libras (700g). Es probable que sienta que el beb se Teacher, English as a foreign language (da pataditas) entre las 18 y 20semanas del Psychiatrist. CAMBIOS EN EL ORGANISMO Su organismo atraviesa por muchos cambios durante el [place], y estos varan de Neomia Dear mujer a Educational psychologist.   Seguir [employer]. Notar que la parte baja del abdomen sobresale.  Podrn aparecer las primeras [employer] caderas, el abdomen y las [place].  Es posible que tenga dolores de cabeza que pueden aliviarse con los medicamentos que su mdico autorice.  Tal vez tenga necesidad de orinar con ms frecuencia porque el feto est ejerciendo presin Ambulance person.  Debido al Vanetta Mulders podr sentir Anthoney Harada estomacal con frecuencia.  Puede estar estreida, ya que ciertas hormonas enlentecen los movimientos de los msculos que [employer] desechos a travs de los intestinos.  Pueden aparecer hemorroides o abultarse e hincharse las venas (venas varicosas).  Puede tener dolor de espalda que se debe al [employer] de peso y a que las hormonas del Management consultant las articulaciones entre los huesos de la pelvis, y Public librarian consecuencia de la modificacin del peso y los msculos que mantienen el equilibrio.  Las [employer] seguirn creciendo y Development worker, community.  Las Veterinary surgeon y estar sensibles al cepillado y al hilo  dental.  Pueden aparecer zonas oscuras o manchas (cloasma, mscara del Psychiatrist) en el rostro que probablemente se atenuarn despus del nacimiento del beb.  Es posible que se forme una lnea oscura desde el ombligo hasta la zona del pubis (linea nigra) que probablemente se atenuarn despus del nacimiento del beb.  Tal vez haya cambios en el cabello que pueden incluir su engrosamiento, crecimiento rpido y cambios en la textura. Adems, a algunas mujeres se les cae el cabello durante o despus del embarazo, o tienen el cabello seco o fino. Lo ms probable es que el cabello se le normalice despus del nacimiento del beb. QU DEBE ESPERAR EN LAS CONSULTAS PRENATALES Durante una visita prenatal de rutina:  La pesarn para asegurarse de que usted y el feto estn creciendo normalmente.  Le tomarn la presin arterial.  Le medirn el abdomen para controlar el desarrollo del beb.  Se escucharn los latidos cardacos fetales.  Se evaluarn los resultados de los estudios solicitados en visitas anteriores. El mdico puede preguntarle lo siguiente:  Cmo se siente.  Si siente los movimientos del beb.  Si ha tenido sntomas anormales, como prdida de lquido, [place], dolores de cabeza intensos o clicos abdominales.  Si tiene [employer]. Otros estudios que podrn realizarse durante el segundo trimestre incluyen lo siguiente:  Anlisis de sangre para detectar:  Concentraciones de hierro bajas (anemia).  Diabetes gestacional (entre la semana 24 y la 28).  Anticuerpos Rh.  Anlisis de orina para detectar infecciones, diabetes o protenas en la orina.  Una ecografa para confirmar que el beb crece  y se desarrolla correctamente.  Una amniocentesis para diagnosticar posibles problemas genticos.  Estudios del feto para descartar espina bfida y sndrome de [place]. INSTRUCCIONES PARA EL CUIDADO EN EL HOGAR   Evite fumar, consumir hierbas, beber alcohol y tomar frmacos que no  le hayan recetado. Estas sustancias qumicas afectan la formacin y el desarrollo del beb.  Siga las indicaciones del mdico en relacin con el uso de medicamentos. Durante el embarazo, hay medicamentos que son seguros de tomar y otros que no.  Haga actividad fsica solo en la forma indicada por el mdico. Sentir clicos uterinos es un buen signo para Restaurant manager, fast food actividad fsica.  Contine comiendo alimentos que sanos con regularidad.  Use un sostn que le brinde buen soporte si le [employer].  No se d baos de inmersin en agua caliente, baos turcos ni saunas.  Colquese el cinturn de seguridad cuando conduzca.  No coma carne cruda ni queso sin cocinar; evite el contacto con las bandejas sanitarias de los gatos y la tierra que estos animales usan. Estos elementos contienen grmenes que pueden causar defectos congnitos en el beb.  Tome las vitaminas prenatales.  Si est estreida, pruebe un laxante suave (si el mdico lo autoriza). Consuma ms alimentos ricos en fibra, como vegetales y frutas frescos y Radiation protection practitioner. Beba gran cantidad de lquido para mantener la orina de tono claro o color amarillo plido.  Dese baos de asiento con agua tibia para Engineer, materials o las molestias causadas por las hemorroides. Use una crema para las hemorroides si el mdico la autoriza.  Si tiene venas varicosas, use medias de descanso. Eleve los pies durante , 3 o 4veces por da. Limite la cantidad de sal en su dieta.  No levante objetos pesados, use zapatos de tacones bajos y [address].  Descanse con las piernas elevadas si tiene calambres o dolor de cintura.  Visite a su dentista si an no lo ha Occupational hygienist. Use un cepillo de dientes blando para higienizarse los dientes y psese el hilo dental con suavidad.  Puede seguir [employer], a menos que el mdico le indique lo contrario.  Concurra a todas las visitas  prenatales segn las indicaciones de su mdico. SOLICITE ATENCIN MDICA SI:   [place].  Siente clicos leves, presin en la pelvis o dolor persistente en el abdomen.  Tiene nuseas, vmitos o diarrea persistentes.  Tiene secrecin vaginal con mal olor.  Siente dolor al [employer]. SOLICITE ATENCIN MDICA DE INMEDIATO SI:   Tiene fiebre.  Tiene una prdida de lquido por la vagina.  Tiene sangrado o pequeas prdidas vaginales.  Siente dolor intenso o clicos en el abdomen.  Sube o baja de peso rpidamente.  Tiene dificultad para respirar y siente dolor de pecho.  Sbitamente se le hinchan mucho el rostro, las [place], los tobillos, los pies o las piernas.  No ha sentido los movimientos del beb durante Georgianne Fick.  Siente un dolor de cabeza intenso que no se alivia con medicamentos.  Hay cambios en la visin. Document Released: 01/18/2005 Document Revised: 04/15/2013 [facility] Patient Information 2015 [place], [place]. This information is not intended to replace advice given to you by your health care provider. Make sure you discuss any questions you have with your health care provider.

## 2015-01-18 NOTE — Progress Notes (Signed)
Subjective:  Lisa Moreno is a 39 y.o. G2P1001 at [redacted]w[redacted]d being seen today for ongoing prenatal care.  Patient reports no complaints and Many questions about [place] results.  Contractions: Not present.  Vag. Bleeding: None. Movement: Present. Denies leaking of fluid.   T2DM: Currently on Metformin  QHS and Glyburide 2.5 BID Fasting 84-94 2hr PP 115-125 Reports some hypoglycemia x2- happening around 5-6 in the PM-  Checked her sugars and they were 72,75. Reports sweating, nervousness, anxiety. Happened at home but she is worried it might happen if she is in her car  Reports she will not take her metfomin at night due to waking up feeling unwell at the night- symptomatic hypogycemia. Will take nutella and milk. She has not checked her sugars at that time.   Pain her legs: described as burning, stinging. Prevent her from walking as she legs feel very tired. Denies loss of strength, numbness. Aggravated by increased movement, alleviated by percocet. She reports pain is worse in the Am.   The following portions of the patient's history were reviewed and updated as appropriate: allergies, current medications, past family history, past medical history, past social history, past surgical history and problem list.   Objective:   Filed Vitals:   01/18/15 0911  BP: 127/73  Pulse: 77  Weight: 245 lb 12.8 oz (111.494 kg)    Fetal Status: Fetal Heart Rate (bpm): 145   Movement: Present     General:  Alert, oriented and cooperative. Patient is in no acute distress.  Skin: Skin is warm and dry. No rash noted.   Cardiovascular: Normal heart rate noted  Respiratory: Normal respiratory effort, no problems with respiration noted  Abdomen: Soft, gravid, appropriate for gestational age. Pain/Pressure: Present     Pelvic: Vag. Bleeding: None     Cervical exam deferred        Extremities: Normal range of motion.  Edema: None  Mental Status: Normal mood and affect. Normal behavior. Normal  judgment and thought content.   Urinalysis: Urine Protein: Negative Urine Glucose: Negative  Assessment and Plan:  Pregnancy: G2P1001 at [redacted]w[redacted]d  1. Hypertension affecting pregnancy, antepartum, unspecified trimester -Stable continue current medications.   2. 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 This pain preceded her pregnancy. Will trial gabapentin and flexeril. Discussed avoiding narcotics. Rx for #15 percocet given for severe pain but discussed that I will not prescribe more.   3. Placenta previa centralis, second trimester -Pelvic rest -no history of CS  4. Supervision of high risk pregnancy, antepartum, second trimester uddated pregnancy box  5. Advanced maternal age in multigravida, second trimester  6. Abnormal [place] results with fetal abnormalities:  - Discussed this at length as patient had very poor recall of the discussion with MFM.   - We reviewed her placentation and need for pelvic rest. We discussed that she might/will likely need a CS.  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     Preterm labor symptoms and general obstetric precautions including but not limited to vaginal bleeding, contractions, leaking of  fluid and fetal movement were reviewed in detail with the patient. Please refer to After Visit Summary for other counseling recommendations.  Return in about 4 weeks (around 02/15/2015) for Routine prenatal care.   Federico Flake, MD   >50% of this 45 minute appointment was spent in direct patient discussion of results, counseling regarding implications of screening, coordination of care, education about hypoglycemia and diabetes management.

## 2015-01-18 NOTE — Progress Notes (Signed)
Requesting refill on percocet.

## 2015-01-22 ENCOUNTER — Ambulatory Visit (INDEPENDENT_AMBULATORY_CARE_PROVIDER_SITE_OTHER): Payer: Medicaid Other | Admitting: Cardiology

## 2015-01-22 ENCOUNTER — Encounter: Payer: Self-pay | Admitting: Cardiology

## 2015-01-22 VITALS — BP 118/62 | HR 80 | Ht 63.0 in | Wt 242.0 lb

## 2015-01-22 DIAGNOSIS — R072 Precordial pain: Secondary | ICD-10-CM | POA: Diagnosis not present

## 2015-01-22 DIAGNOSIS — I10 Essential (primary) hypertension: Secondary | ICD-10-CM | POA: Diagnosis not present

## 2015-01-22 DIAGNOSIS — R0609 Other forms of dyspnea: Secondary | ICD-10-CM | POA: Diagnosis not present

## 2015-01-22 DIAGNOSIS — R0600 Dyspnea, unspecified: Secondary | ICD-10-CM

## 2015-01-22 NOTE — Patient Instructions (Signed)
Your physician recommends that you continue on your current medications as directed. Please refer to the Current Medication list given to you today.     Your physician recommends that you schedule a follow-up appointment in: 6 WEEKS WITH DR Delton See OR AT HER VERY NEXT AVAILABLE

## 2015-01-22 NOTE — Progress Notes (Signed)
Patient ID: Lisa Moreno, female   DOB: [birth year], 39 y.o.   MRN: [id number]      Cardiology Office Note  Date:  01/22/2015   ID:  Lisa Moreno, [place] 1976-02-05, MRN [id number]  PCP:  Lisa Lewandowsky, MD  Cardiologist: Lisa Masson, MD   Chief complain:    History of Present Illness: Lisa Moreno is a 39 y.o. female who presents for evaluation of chest pain and dyspnea on exertion. She has developed worsening DOE during the pregnancy, currently in 104. Week. She has chest pain and neck pain for about a week and feels like her occipital area is burning. No syncope. She had hypertension prior to the pregnancy and was using lisinopril that was discontinued. Her BP gets high at the time but was ok on the last two doctors visits. She doesn't have BP cuff at home.  No LE edema, orthopnea, PND.   01/22/2015 - 6 weeks follow up, she continues to have SOB - minimal at rest, worse on exertion, unable to walk more than 1 flight of stairs, has to rest. 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 No palpitations or syncope, minimal LE edema, no orthopnea or PND.   Past Medical History  Diagnosis Date  . Asthma   . Hypertension   . Diabetes mellitus without complication   . GERD (gastroesophageal reflux disease)   . Gestational diabetes    Past Surgical History  Procedure Laterality Date  . Breast reduction surgery    . Tonsillectomy       Current Outpatient Prescriptions  Medication Sig Dispense Refill  . ACCU-CHEK FASTCLIX LANCETS MISC 1 Units by Percutaneous route 4 (four) times daily. 100 each 12  . acetaminophen (TYLENOL) 500 MG tablet Take 1,500 mg by mouth daily.     Marland Kitchen albuterol (PROVENTIL HFA;VENTOLIN HFA) 108 (90 BASE) MCG/ACT inhaler Inhale 2 puffs into the lungs every 6 (six) hours as needed for wheezing. 1 Inhaler 11  . albuterol (PROVENTIL) (5 MG/ML) 0.5%  nebulizer solution Take 2.5 mg by nebulization every 6 (six) hours as needed for wheezing or shortness of breath.    Marland Kitchen aspirin 81 MG tablet Take 1 tablet (81 mg total) by mouth daily. 30 tablet 12  . cyclobenzaprine (FLEXERIL) 5 MG tablet Take 1 tablet (5 mg total) by mouth every 8 (eight) hours as needed for muscle spasms. 30 tablet 2  . docusate sodium (COLACE) 100 MG capsule Take 1 capsule (100 mg total) by mouth 2 (two) times daily as needed. 30 capsule 2  . folic acid (FOLVITE) 1 MG tablet Take 1 mg by mouth daily.    Marland Kitchen gabapentin (NEURONTIN) 300 MG capsule Take 1 capsule (300 mg total) by mouth at bedtime. 30 capsule 11  . glucose blood test strip Use as instructed to check blood glucose 4 times daily. 100 each 12  . glyBURIDE (DIABETA) 2.5 MG tablet Take 1 tablet (2.5 mg total) by mouth 2 (two) times daily with a meal. 60 tablet 3  . metFORMIN (GLUCOPHAGE) 1000 MG tablet Take 1 tablet (1,000 mg total) by mouth at bedtime. 30 tablet 3  . metoCLOPramide (REGLAN) 10 MG tablet Take 1 tablet (10 mg total) by mouth every 6 (six) hours as needed for nausea. 30 tablet 2  . oxyCODONE-acetaminophen (PERCOCET/ROXICET) 5-325 MG per tablet Take 1-2 tablets by mouth every 6 (six) hours as  needed for severe pain. 15 tablet 0  . pantoprazole (PROTONIX) 40 MG tablet Take 1 tablet (40 mg total) by mouth daily. 30 tablet 4  . Prenatal Vit-Fe Fumarate-FA (PRENATAL MULTIVITAMIN) TABS tablet Take 1 tablet by mouth daily at 12 noon.    . ranitidine (ZANTAC) 150 MG tablet Take 1 tablet (150 mg total) by mouth 2 (two) times daily. 60 tablet 2  . senna (SENOKOT) 8.6 MG tablet Take 2 tablets by mouth at bedtime.      No current facility-administered medications for this visit.    Allergies:   Decadron    Social History:  The patient  reports that she has never smoked. She has never used smokeless tobacco. She reports that she does not drink alcohol or use illicit drugs.   Family History:  The patient's family  history includes Depression in her mother; Diabetes in her father and mother; Hypertension in her mother; Kidney disease in her father; Stroke in her father and mother.    ROS:  Please see the history of present illness.   Otherwise, review of systems are positive for none.   All other systems are reviewed and negative.    PHYSICAL EXAM: VS:  BP 118/62 mmHg  Pulse 80  Ht  (1.6 m)  Wt 242 lb (109.77 kg)  BMI 42.88 kg/m2  LMP 08/05/2014 , BMI Body mass index is 42.88 kg/(m^2). GEN: Well nourished, well developed, in no acute distress HEENT: normal Neck: no JVD, carotid bruits, or masses Cardiac: RRR; no murmurs, rubs, or gallops,no edema  Respiratory:  clear to auscultation bilaterally, normal work of breathing GI: soft, nontender, consistent with 2.trimester of pregnancy MS: no deformity or atrophy Skin: warm and dry, no rash Neuro:  Strength and sensation are intact Psych: euthymic mood, full affect   EKG:  EKG is not ordered today. The ekg ordered today demonstrates SR, iRBBB   Recent Labs: 03/30/2014: TSH 1.483 11/11/2014: ALT 15; BUN 9; Creat 0.43*; Hemoglobin 11.5*; Platelets 270; Potassium 4.0; Sodium 137    Lipid Panel    Component Value Date/Time   CHOL 148 03/30/2014 1547   TRIG 101 03/30/2014 1547   HDL 38* 03/30/2014 1547   CHOLHDL 3.9 03/30/2014 1547   VLDL 20 03/30/2014 1547   LDLCALC 90 03/30/2014 1547   Wt Readings from Last 3 Encounters:  01/22/15 242 lb (109.77 kg)  01/18/15 245 lb 12.8 oz (111.494 kg)  01/06/15 244 lb 4 oz (110.791 kg)    TTE: 12/17/2014  - Left ventricle: The cavity size was mildly dilated. Wall thickness was normal. Systolic function was normal. The estimated ejection fraction was in the range of 55% to 60%. Wall motion was normal; there were no regional wall motion abnormalities. Left ventricular diastolic function parameters were normal. - Left atrium: The atrium was mildly dilated.  Impressions: - Normal LV  systolic and diastolic function; mild LVE and LAE; trace MR and TR.    ASSESSMENT AND PLAN:  1.  Chest pain - seems very atypical, echocardiogram is completely normal, mildly dilated LV can be attributed to increased plasma volume during pregnancy, LVEF is normal, normal diastolic function.  2. DOE - normal echo, most probably normal changes of pregnancy  3. Hypertension - currently normotensive - we will start labetalol if becomes hypertensive   Follow up in 6 weeks prior to the C section, we will reorder echo if necessary.  Signed, Lisa Masson, MD  01/22/2015 11:45 AM    [facility] [address], [address] [place], [place]  [address] Phone: [phone]; Fax: [phone]

## 2015-02-03 ENCOUNTER — Other Ambulatory Visit (HOSPITAL_COMMUNITY): Payer: Self-pay | Admitting: Maternal and Fetal Medicine

## 2015-02-03 ENCOUNTER — Ambulatory Visit (HOSPITAL_COMMUNITY)
Admission: RE | Admit: 2015-02-03 | Discharge: 2015-02-03 | Disposition: A | Discharge status: Home / Self Care | Payer: Medicaid Other | Source: Ambulatory Visit | Attending: Family Medicine | Admitting: Family Medicine

## 2015-02-03 VITALS — BP 131/79 | HR 82 | Wt 243.2 lb

## 2015-02-03 DIAGNOSIS — O358XX0 Maternal care for other (suspected) fetal abnormality and damage, not applicable or unspecified: Secondary | ICD-10-CM | POA: Diagnosis not present

## 2015-02-03 DIAGNOSIS — O09522 Supervision of elderly multigravida, second trimester: Secondary | ICD-10-CM | POA: Insufficient documentation

## 2015-02-03 DIAGNOSIS — O99212 Obesity complicating pregnancy, second trimester: Secondary | ICD-10-CM | POA: Diagnosis not present

## 2015-02-03 DIAGNOSIS — O24112 Pre-existing diabetes mellitus, type 2, in pregnancy, second trimester: Secondary | ICD-10-CM

## 2015-02-03 DIAGNOSIS — E669 Obesity, unspecified: Secondary | ICD-10-CM | POA: Diagnosis not present

## 2015-02-03 DIAGNOSIS — Z3A26 26 weeks gestation of pregnancy: Secondary | ICD-10-CM | POA: Diagnosis not present

## 2015-02-03 DIAGNOSIS — O4402 Placenta previa specified as without hemorrhage, second trimester: Secondary | ICD-10-CM

## 2015-02-03 DIAGNOSIS — O24312 Unspecified pre-existing diabetes mellitus in pregnancy, second trimester: Secondary | ICD-10-CM

## 2015-02-03 DIAGNOSIS — O359XX0 Maternal care for (suspected) fetal abnormality and damage, unspecified, not applicable or unspecified: Secondary | ICD-10-CM

## 2015-02-03 DIAGNOSIS — O0993 Supervision of high risk pregnancy, unspecified, third trimester: Secondary | ICD-10-CM

## 2015-02-05 ENCOUNTER — Inpatient Hospital Stay (HOSPITAL_COMMUNITY)
Admission: AD | Admit: 2015-02-05 | Discharge: 2015-02-06 | Disposition: A | Discharge status: Home / Self Care | Payer: Medicaid Other | Source: Ambulatory Visit | Attending: Obstetrics and Gynecology | Admitting: Obstetrics and Gynecology

## 2015-02-05 ENCOUNTER — Encounter (HOSPITAL_COMMUNITY): Payer: Self-pay

## 2015-02-05 DIAGNOSIS — O26892 Other specified pregnancy related conditions, second trimester: Secondary | ICD-10-CM | POA: Diagnosis not present

## 2015-02-05 DIAGNOSIS — Z7984 Long term (current) use of oral hypoglycemic drugs: Secondary | ICD-10-CM | POA: Insufficient documentation

## 2015-02-05 DIAGNOSIS — Z7982 Long term (current) use of aspirin: Secondary | ICD-10-CM | POA: Insufficient documentation

## 2015-02-05 DIAGNOSIS — O4402 Placenta previa specified as without hemorrhage, second trimester: Secondary | ICD-10-CM

## 2015-02-05 DIAGNOSIS — O4412 Placenta previa with hemorrhage, second trimester: Secondary | ICD-10-CM

## 2015-02-05 DIAGNOSIS — Z3A26 26 weeks gestation of pregnancy: Secondary | ICD-10-CM | POA: Diagnosis not present

## 2015-02-05 DIAGNOSIS — K5900 Constipation, unspecified: Secondary | ICD-10-CM | POA: Diagnosis not present

## 2015-02-05 DIAGNOSIS — N816 Rectocele: Secondary | ICD-10-CM

## 2015-02-05 DIAGNOSIS — K5901 Slow transit constipation: Secondary | ICD-10-CM | POA: Diagnosis not present

## 2015-02-05 DIAGNOSIS — R109 Unspecified abdominal pain: Secondary | ICD-10-CM | POA: Diagnosis present

## 2015-02-05 LAB — URINALYSIS, ROUTINE W REFLEX MICROSCOPIC
BILIRUBIN URINE: NEGATIVE
Glucose, UA: NEGATIVE mg/dL
KETONES UR: 15 mg/dL — AB
Leukocytes, UA: NEGATIVE
NITRITE: NEGATIVE
PH: 6 (ref 5.0–8.0)
PROTEIN: NEGATIVE mg/dL
Specific Gravity, Urine: 1.02 (ref 1.005–1.030)
Urobilinogen, UA: 0.2 mg/dL (ref 0.0–1.0)

## 2015-02-05 LAB — URINE MICROSCOPIC-ADD ON

## 2015-02-05 NOTE — MAU Note (Signed)
Hasn't had a BM in a week. Having pressure in rectum.  No bleeding. No leaking.  Baby moving well. Pt came in by EMS.

## 2015-02-05 NOTE — MAU Provider Note (Signed)
History       CSN: [id number]  Arrival date and time: 02/05/15 2209   None   MD eval at 22:20 This 39 yr G2P1001 at 7476w6d is seen in MAU after arrival via ambulance in marked pain from rectal pressure. Pt has not had a BM in over a week. . She has used miralax, but no enemas. Husband inspected perineum and "saw something"  . NO bleeding or ROM. Exam shows a bulging rectocele with discomfort to vaginal palpation over rectocele.   Chief Complaint  Patient presents with  . Rectal Pain    no bm in a week. despite miralax   HPI  [facility] with central placenta previa, without bleeding episode, as well as fetal anomalies on u/s , bilateral club feet, spinal malaformation  Past Medical History  Diagnosis Date  . Asthma   . Hypertension   . Diabetes mellitus without complication (HCC)   . GERD (gastroesophageal reflux disease)   . Gestational diabetes     Past Surgical History  Procedure Laterality Date  . Breast reduction surgery    . Tonsillectomy      Family History  Problem Relation Age of Onset  . Diabetes Mother   . Stroke Mother   . Hypertension Mother   . Depression Mother   . Diabetes Father   . Stroke Father   . Kidney disease Father     Social History  Substance Use Topics  . Smoking status: Never Smoker   . Smokeless tobacco: Never Used  . Alcohol Use: No    Allergies:  Allergies  Allergen Reactions  . Decadron [Dexamethasone] Other (See Comments)    Intense burning vaginal/rectal area.     Prescriptions prior to admission  Medication Sig Dispense Refill Last Dose  . ACCU-CHEK FASTCLIX LANCETS MISC 1 Units by Percutaneous route 4 (four) times daily. 100 each 12 Taking  . acetaminophen (TYLENOL) 500 MG tablet Take 1,500 mg by mouth daily.    Taking  . albuterol (PROVENTIL HFA;VENTOLIN HFA) 108 (90 BASE) MCG/ACT inhaler Inhale 2 puffs into the lungs every 6 (six) hours as needed for wheezing. 1 Inhaler 11 Taking  . albuterol (PROVENTIL) (5  MG/ML) 0.5% nebulizer solution Take 2.5 mg by nebulization every 6 (six) hours as needed for wheezing or shortness of breath.   Taking  . aspirin 81 MG tablet Take 1 tablet (81 mg total) by mouth daily. 30 tablet 12 Taking  . cyclobenzaprine (FLEXERIL) 5 MG tablet Take 1 tablet (5 mg total) by mouth every 8 (eight) hours as needed for muscle spasms. 30 tablet 2 Taking  . docusate sodium (COLACE) 100 MG capsule Take 1 capsule (100 mg total) by mouth 2 (two) times daily as needed. 30 capsule 2 Taking  . folic acid (FOLVITE) 1 MG tablet Take 1 mg by mouth daily.   Taking  . gabapentin (NEURONTIN) 300 MG capsule Take 1 capsule (300 mg total) by mouth at bedtime. 30 capsule 11 Taking  . glucose blood test strip Use as instructed to check blood glucose 4 times daily. 100 each 12 Taking  . glyBURIDE (DIABETA) 2.5 MG tablet Take 1 tablet (2.5 mg total) by mouth 2 (two) times daily with a meal. 60 tablet 3 Taking  . metFORMIN (GLUCOPHAGE) 1000 MG tablet Take 1 tablet (1,000 mg total) by mouth at bedtime. 30 tablet 3 Taking  . metoCLOPramide (REGLAN) 10 MG tablet Take 1 tablet (10 mg total) by mouth every 6 (six) hours as needed for  nausea. 30 tablet 2 Taking  . oxyCODONE-acetaminophen (PERCOCET/ROXICET) 5-325 MG per tablet Take 1-2 tablets by mouth every 6 (six) hours as needed for severe pain. 15 tablet 0 Taking  . pantoprazole (PROTONIX) 40 MG tablet Take 1 tablet (40 mg total) by mouth daily. 30 tablet 4 Taking  . Prenatal Vit-Fe Fumarate-FA (PRENATAL MULTIVITAMIN) TABS tablet Take 1 tablet by mouth daily at 12 noon.   Taking  . ranitidine (ZANTAC) 150 MG tablet Take 1 tablet (150 mg total) by mouth 2 (two) times daily. 60 tablet 2 Taking  . senna (SENOKOT) 8.6 MG tablet Take 2 tablets by mouth at bedtime.    Taking    ROS Physical Exam   Temperature 98.5 F (36.9 C), temperature source Oral, resp. rate 20, last menstrual period 08/05/2014.  Physical Exam  Physical Examination: General appearance -  alert, well appearing, and in no distress, oriented to person, place, and time, overweight, anxious and ill-appearing Mental status - alert, oriented to person, place, and time, normal mood, behavior, speech, dress, motor activity, and thought processes, agitated Abdomen - soft, nontender, nondistended, no masses or organomegaly Gravid uterus, nontender Pelvic - VULVA: normal appearing vulva with no masses, tenderness or lesions, slight abduction of introitus reveals a large rectocele, with hard firm large stool filling rectocele, RECTAL: declined by the patient, rectocele noted , exam limited by pt discomfort and avoidance of cervix Rectal -  Extremities - peripheral pulses normal, no pedal edema, no clubbing or cyanosis   MAU Course  Procedures Soap suds enema MDM 23:44 pt completely releived by enema.  Assessment and Plan  Resolved constipation. Plan d/c home   continue miralax daily or bid to prevent constipation Keep scheduled appt [facility]  Lisa Moreno V 02/05/2015, 10:26 PM

## 2015-02-06 NOTE — Discharge Instructions (Signed)
Hemorragia vaginal durante el embarazo (segundo trimestre) (Vaginal Bleeding During Pregnancy, Second Trimester) Durante el embarazo es relativamente frecuente que se presente una pequea hemorragia (manchas). Esta situacin generalmente mejora por s misma. Hay diversos factores que pueden causar hemorragia o Games developer. Algunas manchas pueden estar relacionadas al [employer] y otras no. A veces, la hemorragia es normal y no es un problema. Sin embargo, la hemorragia tambin puede ser un signo de algo grave. Debe informar a su mdico de inmediato si tiene alguna hemorragia vaginal. Algunas causas posibles de hemorragia vaginal durante el segundo trimestre incluyen: 1. Infeccin, inflamacin o crecimientos anormales en el cuello del tero. 2. La placenta cubre parcial o completamente la abertura del cuello del tero (placenta previa). 3. La placenta puede haberse separado del tero (abrupcin placentaria). 4. Usted puede estar sufriendo un trabajo de parto prematuro. 5. Es probable que el cuello del tero no sea lo suficientemente resistente para Pharmacologist un beb dentro del tero (insuficiencia cervical). 6. Se pueden haber desarrollado pequeos quistes en el tero en lugar de tejido de embarazo (embarazo molar). INSTRUCCIONES PARA EL CUIDADO EN EL HOGAR  Controle su afeccin para ver si hay cambios. Las siguientes indicaciones ayudarn a Psychologist, educational [employer] pueda sentir:  Siga las indicaciones del mdico para restringir su actividad. Si el mdico le indica descanso en la cama, debe quedarse en la cama y levantarse solo para ir al bao. No obstante, el mdico puede permitirle que continu con tareas livianas.  Si es necesario, organcese para que alguien le ayude con las actividades y responsabilidades cotidianas mientras est en cama.  Lleve un registro de la cantidad y la saturacin de las toallas higinicas que Landscape architect. [place] este dato.  No use tampones.No se  haga duchas vaginales.  No tenga relaciones sexuales u orgasmos hasta que el mdico la autorice.  Si elimina tejido por la vagina, gurdelo para mostrrselo al [employer].  [place] solo medicamentos de venta libre o recetados, segn las indicaciones del mdico.  No tome aspirina, ya que puede causar hemorragias.  No realice ejercicio ni ninguna actividad intensa, ni levante objetos pesados sin el permiso de su mdico.  Cumpla con todas las visitas de control, segn le indique su mdico. SOLICITE ATENCIN MDICA SI:  Tiene un sangrado vaginal en cualquier momento del embarazo.  Tiene calambres o dolores de [place].  Tiene fiebre que los medicamentos no Sports coach. SOLICITE ATENCIN MDICA DE INMEDIATO SI:   Siente calambres intensos en la espalda o en el vientre (abdomen).  Siente contracciones.  Tiene escalofros.  Elimina cogulos grandes o tejido por la vagina.  La hemorragia aumenta.  Si se siente mareada, dbil o se desmaya.  Tiene una prdida importante o sale lquido a borbotones por la vagina. ASEGRESE DE QUE:  Comprende estas instrucciones.  Controlar su afeccin.  Recibir ayuda de inmediato si no mejora o si empeora.   Esta informacin no tiene Theme park manager el consejo del mdico. Asegrese de hacerle al mdico cualquier pregunta que tenga.   Document Released: 01/18/2005 Document Revised: 04/15/2013 Elsevier Interactive Patient Education 2016 [employer].  Reposo plvico  (Pelvic Rest) El reposo plvico se recomienda a las mujeres cuando:  7. La placenta cubre parcial o completamente la abertura del cuello del tero (placenta previa). 8. Hay sangrado entre la pared del tero y el saco amnitico en el primer trimestre (hemorragia subcorinica). 9. El cuello uterino comienza a abrirse sin iniciarse el trabajo de parto (cuello uterino incompetente, insuficiencia  cervical). 10. El trabajo de [place] se inicia muy pronto (parto  prematuro). INSTRUCCIONES PARA EL CUIDADO EN EL HOGAR   No tenga relaciones sexuales, estimulacin, ni orgasmos.  No use tampones, no se haga duchas vaginales ni coloque ningn objeto en la vagina.  No levante objetos que pesen ms de 10 libras (4,5 kg).  Evite las actividades extenuantes o tensionar los msculos de la pelvis. SOLICITE ATENCIN MDICA SI:   Tiene un sangrado vaginal durante el embarazo. Considrelo como una posible emergencia.  Siente clicos en la zona baja del estmago (ms fuertes que los clicos menstruales).  Nota flujo vaginal (acuoso, con moco o [place]).  Siente un dolor en la espalda leve y sordo.  Tiene contracciones regulares o endurecimiento del tero. SOLICITE ATENCIN MDICA DE INMEDIATO SI:  Observa sangrado vaginal y tiene placenta previa.    Esta informacin no tiene Theme park managercomo fin reemplazar el consejo del mdico. Asegrese de hacerle al mdico cualquier pregunta que tenga.   Document Released: 01/03/2012 Elsevier Interactive Patient Education 2016 [employer]  Reposo plvico  (Pelvic Rest) El reposo plvico se recomienda a las mujeres cuando:  11. La placenta cubre parcial o completamente la abertura del cuello del tero (placenta previa). 12. Hay sangrado entre la pared del tero y el saco amnitico en el primer trimestre (hemorragia subcorinica). 13. El cuello uterino comienza a abrirse sin iniciarse el trabajo de parto (cuello uterino incompetente, insuficiencia cervical). 14. El trabajo de [place] se inicia muy pronto (parto prematuro). INSTRUCCIONES PARA EL CUIDADO EN EL HOGAR   No tenga relaciones sexuales, estimulacin, ni orgasmos.  No use tampones, no se haga duchas vaginales ni coloque ningn objeto en la vagina.  No levante objetos que pesen ms de 10 libras (4,5 kg).  Evite las actividades extenuantes o tensionar los msculos de la pelvis. SOLICITE ATENCIN MDICA SI:   Tiene un sangrado vaginal durante el embarazo.  Considrelo como una posible emergencia.  Siente clicos en la zona baja del estmago (ms fuertes que los clicos menstruales).  Nota flujo vaginal (acuoso, con moco o [place]).  Siente un dolor en la espalda leve y sordo.  Tiene contracciones regulares o endurecimiento del tero. SOLICITE ATENCIN MDICA DE INMEDIATO SI:  Observa sangrado vaginal y tiene placenta previa.    Esta informacin no tiene Theme park managercomo fin reemplazar el consejo del mdico. Asegrese de hacerle al mdico cualquier pregunta que tenga.   Document Released: 01/03/2012 Elsevier Interactive Patient Education 2016 [employer].  Placenta previa  (Placenta Previa) La placenta previa es un trastorno que padece una mujer embarazada cuando la placenta se implanta en la parte inferior del tero. La placenta cubre de manera parcial o total la abertura del cuello del tero. Es un problema, ya que el beb debe pasar a travs del cuello del tero durante el [place]. Hay tres tipos de placenta previa. Ellos son:  7115. Placenta previa marginal. La placenta est cerca del cuello uterino pero no cubre la abertura. 16. Placenta previa parcial. La placenta cubre una parte de la abertura del cuello del tero. 17. Placenta previa completa. La placenta cubre toda la abertura del cuello del tero.  Segn el tipo de placenta previa,existe la probabilidad de que la placenta pueda ubicarse en una posicin normal y que deje de cubrir el cuello del tero, a medida que el embarazo avanza. Es importante que cumpla con todos los controles prenatales.  FACTORES DE RIESGO  Es ms probable que sufra placenta previa si:   Est embarazada de ms de  un beb (embarazo mltiple).   La forma de su tero no es normal.   Tiene cicatrices en el revestimiento interno del tero.   Tuvo cirugas anteriores en el tero, como una cesrea.   Ya ha tenido un beb.   Tiene una historia de placenta previa.   Ha fumado o ingerido cocana Academic librarian.    Est embarazada y tiene 35 aos o ms.  SNTOMAS  El sntoma principal de la placenta previa es un sangrado vaginal indoloro, sbito, durante la segunda mitad del [place]. La cantidad del sangrado puede ser desde ligeramente leve a abundante. El sangrado puede detenerse por s mismo, pero siempre vuelve a ocurrir. Tambin puede haber clicos, contracciones regulares, dolor abdominal y Radiographer, therapeutic cintura.  DIAGNSTICO  La placenta previa puede diagnosticarse con una ecografa, encontrando la ubicacin de la placenta. La ecografa puede encontrar la placenta previa ya sea durante una visita prenatal de rutina o luego de que se advierte un sangrado vaginal. Si le diagnostican placenta previa, Financial trader los exmenes vaginales para reducir el riesgo de un sangrado abundante. Es posible que la placenta previa no se diagnostique hasta que se produzca el sangrado durante el [place].  TRATAMIENTO  El tratamiento especfico depende de:   La cantidad de sangrado o si ste se ha detenido.  En qu etapa se encuentra del embarazo.   El [place] del beb.   La ubicacin del beb y de la placenta.   El tipo de placenta previa.  Segn esos factores, el mdico podr indicar:   Que disminuya la actividad fsica   Hacer reposo en cama, en su casa o en el hospital.  Reposo plvico Esto significa que no tenga relaciones sexuales, no use tampones, no se haga exmenes plvicos ni introduzca nada dentro de la vagina.  Una transfusin sangunea para reponer la prdida de [address]  Una cesrea si el sangrado es abundante y no puede ser controlado o si la placenta cubre completamente el cuello.  Medicamentos para TEFL teacher un trabajo de parto prematuro o para que [employer] pulmones del feto si es necesario iniciar el parto antes de que el embarazo llegue a trmino.  EN QU MOMENTO DEBE BUSCAR ASISTENCIA MDICA DE INMEDIATO SI LA ENVAN A SU CASA CON EL DIAGNSTICO DE PLACENTA  PREVIA?  Solicite atencin mdica de inmediato si tiene sntomas de placenta previa. Debe concurrir al hospital para ser controlada inmediatamente. Nuevamente, esos sntomas son:   Sangrado vaginal repentino e indoloro, an una pequea cantidad.  Clicos o contracciones regulares.  Dolor en la cintura o en el abdomen.   Esta informacin no tiene Theme park manager el consejo del mdico. Asegrese de hacerle al mdico cualquier pregunta que tenga.   Document Released: 01/18/2005 Document Revised: 12/11/2012 Elsevier Interactive Patient Education 2016 [employer]. Estreimiento - Adultos (Constipation, Adult) Se llama constipacin cuando: 18. Elimina heces (defeca) menos de 3 veces por semana. 19. Tiene dificultad para defecar. 20. Las heces son secas y duras o son ms grandes que lo normal. CUIDADOS EN EL HOGAR   Consuma alimentos con alto contenido de [place]. Por ejemplo, frutas, vegetales, porotos y cereales integrales, como el arroz integral.  Evite los alimentos ricos en grasas y International aid/development worker. Estos incluyen patatas fritas, hamburguesas, galletas, dulces y refrescos.  Si no consume suficientes alimentos ricos en fibras, tome productos que tengan agregado de fibra (suplementos).  Beba suficiente lquido para mantener el pis (orina) claro o de color amarillo plido.  Maricela Curet  ejercicio en forma regular, o como lo indique su mdico.  Vaya al bao cuando sienta la necesidad de defecar. No se aguante las ganas.  Solo tome los medicamentos que le haya indicado su mdico. No tome medicamentos que le ayuden a Advertising copywriter (laxantes) sin antes consultarlo con su mdico. SOLICITE AYUDA DE INMEDIATO SI:   Observa sangre brillante en las heces (materia fecal).  El estreimiento dura ms de 4 das o [place].  Tiene dolor en el vientre (abdominal) o el trasero (recto).  Las heces son delgadas (como un lpiz).  Pierde peso de [place] inexplicable. ASEGRESE DE QUE:   Comprende estas  instrucciones.  Controlar su afeccin.  Recibir ayuda de inmediato si no mejora o si empeora.   Esta informacin no tiene Theme park manager el consejo del mdico. Asegrese de hacerle al mdico cualquier pregunta que tenga.   Document Released: 05/13/2010 Document Revised: 05/01/2014 Elsevier Interactive Patient Education [employer] Constipation, Adult Constipation is when a person: 35. Poops (has a bowel movement) less than 3 times a week. 22. Has a hard time pooping. 23. Has poop that is dry, hard, or bigger than normal. HOME CARE   Eat foods with a lot of fiber in them. This includes fruits, vegetables, beans, and whole grains such as brown rice.  Avoid fatty foods and foods with a lot of sugar. This includes french fries, hamburgers, cookies, candy, and soda.  If you are not getting enough fiber from food, take products with added fiber in them (supplements).  Drink enough fluid to keep your pee (urine) clear or pale yellow.  Exercise on a regular basis, or as told by your doctor.  Go to the restroom when you feel like you need to poop. Do not hold it.  Only take medicine as told by your doctor. Do not take medicines that help you poop (laxatives) without talking to your doctor first. GET HELP RIGHT AWAY IF:   You have bright red blood in your poop (stool).  Your constipation lasts more than 4 days or gets worse.  You have belly (abdominal) or butt (rectal) pain.  You have thin poop (as thin as a pencil).  You lose weight, and it cannot be explained. MAKE SURE YOU:   Understand these instructions.  Will watch your condition.  Will get help right away if you are not doing well or get worse.   This information is not intended to replace advice given to you by your health care provider. Make sure you discuss any questions you have with your health care provider.   Document Released: 09/27/2007 Document Revised: 05/01/2014 Document Reviewed:  01/20/2013 Elsevier Interactive Patient Education [employer]

## 2015-02-10 ENCOUNTER — Telehealth: Payer: Self-pay | Admitting: *Deleted

## 2015-02-10 NOTE — Telephone Encounter (Signed)
Received message left on nurse line on 02/10/15 at 1331.  Patient states she is still having pain and only has 3 pills left.  Would like a refill on her percocet.  [place] [facility] [place] and [place] told her to call us.  Requests a return call.

## 2015-02-10 NOTE — Telephone Encounter (Signed)
Spoke with Dr. Shawnie PonsPratt.  No refill authorized based on documentation from Dr. Diablock BlasNewton's note when she prescribed initially.  Lisa BellowsBeronica Moreno called patient and translated for me.  Explained to patient that we could not refill her medication.  Patient continued to express the reasons why she needs the refill.  Patient's phone then disconnected.  Lisa called back and left message for patient to return our call tomorrow.  Tried to contact via mobile number also - no answer.  Patient should be instructed to keep her appointment on Monday.

## 2015-02-11 ENCOUNTER — Telehealth: Payer: Self-pay

## 2015-02-11 NOTE — Telephone Encounter (Signed)
Patient called back wanting to know about getting refills on her percocet. I informed her per Tresa EndoKelly. R that she could not get any refills until she came in on Monday for her follow up appointment and discuss that with physician. She was not too happy but understood.

## 2015-02-15 ENCOUNTER — Ambulatory Visit (INDEPENDENT_AMBULATORY_CARE_PROVIDER_SITE_OTHER): Payer: Medicaid Other | Admitting: Obstetrics and Gynecology

## 2015-02-15 ENCOUNTER — Encounter: Payer: Self-pay | Admitting: Obstetrics and Gynecology

## 2015-02-15 VITALS — BP 121/74 | HR 84 | Temp 98.5°F | Wt 239.4 lb

## 2015-02-15 DIAGNOSIS — O289 Unspecified abnormal findings on antenatal screening of mother: Secondary | ICD-10-CM

## 2015-02-15 DIAGNOSIS — O09523 Supervision of elderly multigravida, third trimester: Secondary | ICD-10-CM | POA: Diagnosis not present

## 2015-02-15 DIAGNOSIS — E1142 Type 2 diabetes mellitus with diabetic polyneuropathy: Secondary | ICD-10-CM | POA: Diagnosis not present

## 2015-02-15 DIAGNOSIS — O0993 Supervision of high risk pregnancy, unspecified, third trimester: Secondary | ICD-10-CM

## 2015-02-15 DIAGNOSIS — O24112 Pre-existing diabetes mellitus, type 2, in pregnancy, second trimester: Secondary | ICD-10-CM | POA: Diagnosis not present

## 2015-02-15 DIAGNOSIS — O4413 Placenta previa with hemorrhage, third trimester: Secondary | ICD-10-CM

## 2015-02-15 DIAGNOSIS — O4403 Placenta previa specified as without hemorrhage, third trimester: Secondary | ICD-10-CM

## 2015-02-15 DIAGNOSIS — E119 Type 2 diabetes mellitus without complications: Secondary | ICD-10-CM | POA: Diagnosis present

## 2015-02-15 DIAGNOSIS — R319 Hematuria, unspecified: Secondary | ICD-10-CM | POA: Diagnosis not present

## 2015-02-15 DIAGNOSIS — O163 Unspecified maternal hypertension, third trimester: Secondary | ICD-10-CM | POA: Diagnosis not present

## 2015-02-15 DIAGNOSIS — Z23 Encounter for immunization: Secondary | ICD-10-CM

## 2015-02-15 LAB — CBC
HEMATOCRIT: 32.5 % — AB (ref 36.0–46.0)
Hemoglobin: 10.6 g/dL — ABNORMAL LOW (ref 12.0–15.0)
MCH: 24.8 pg — ABNORMAL LOW (ref 26.0–34.0)
MCHC: 32.6 g/dL (ref 30.0–36.0)
MCV: 76.1 fL — AB (ref 78.0–100.0)
MPV: 10.5 fL (ref 8.6–12.4)
Platelets: 312 10*3/uL (ref 150–400)
RBC: 4.27 MIL/uL (ref 3.87–5.11)
RDW: 15.3 % (ref 11.5–15.5)
WBC: 12.9 10*3/uL — ABNORMAL HIGH (ref 4.0–10.5)

## 2015-02-15 LAB — POCT URINALYSIS DIP (DEVICE)
GLUCOSE, UA: NEGATIVE mg/dL
Nitrite: NEGATIVE
PROTEIN: 30 mg/dL — AB
Urobilinogen, UA: 1 mg/dL (ref 0.0–1.0)
pH: 5.5 (ref 5.0–8.0)

## 2015-02-15 MED ORDER — TETANUS-DIPHTH-ACELL PERTUSSIS 5-2.5-18.5 LF-MCG/0.5 IM SUSP
0.5000 mL | Freq: Once | INTRAMUSCULAR | Status: AC
  Administered 2015-02-15: 0.5 mL via INTRAMUSCULAR

## 2015-02-15 MED ORDER — NYSTATIN 100000 UNIT/GM EX CREA: 1.0000 "application " | TOPICAL_CREAM | Freq: Two times a day (BID) | CUTANEOUS | Status: DC

## 2015-02-15 MED ORDER — OXYCODONE-ACETAMINOPHEN 5-325 MG PO TABS: 1.0000 | ORAL_TABLET | Freq: Four times a day (QID) | ORAL | Status: DC | PRN

## 2015-02-15 MED ORDER — ACCU-CHEK NANO SMARTVIEW W/DEVICE KIT: 1.0000 | PACK | Freq: Four times a day (QID) | Status: DC

## 2015-02-15 NOTE — Progress Notes (Signed)
Subjective:  Lisa RamusJohanna Luyando De La Moreno is a 39 y.o. G2P1001 at 4268w2d being seen today for ongoing prenatal care.  Patient reports no complaints.  Contractions: Not present.  Vag. Bleeding: None. Movement: Present. Denies leaking of fluid.   The following portions of the patient's history were reviewed and updated as appropriate: allergies, current medications, past family history, past medical history, past social history, past surgical history and problem list. Problem list updated.  Objective:   Filed Vitals:   02/15/15 0944  BP: 121/74  Pulse: 84  Temp: 98.5 F (36.9 C)  Weight: 239 lb 6.4 oz (108.591 kg)    Fetal Status: Fetal Heart Rate (bpm): 136   Movement: Present     General:  Alert, oriented and cooperative. Patient is in no acute distress.  Skin: Skin is warm and dry. No rash noted.   Cardiovascular: Normal heart rate noted  Respiratory: Normal respiratory effort, no problems with respiration noted  Abdomen: Soft, gravid, appropriate for gestational age. Pain/Pressure: Present     Pelvic: Vag. Bleeding: None     Cervical exam deferred        Extremities: Normal range of motion.  Edema: None  Mental Status: Normal mood and affect. Normal behavior. Normal judgment and thought content.   Urinalysis:      Assessment and Plan:  Pregnancy: G2P1001 at 1768w2d  1. Type 2 diabetes mellitus without complication, unspecified long term insulin use status (HCC) Patient has not been checking CBGs since her last visit because her mother accidentally took her meter with her to [place] - patient continued to take glyburide and metformin - Rx for new meter provided - Patient with yeast infection in her groins- Rx nystatin cream provided - third trimester labs today: - CBC - RPR - HIV antibody (with reflex)  2. Supervision of high risk pregnancy, antepartum, third trimester Patient describes sciatic pain to her right leg. Advised stretching exercise Refilled percocet as  requested  3. Pregnancy with type 2 diabetes mellitus in second trimester (HCC)   4. Placenta previa centralis, third trimester Follow up ultrasound scheduled in November Pelvic rest  5. Hypertension affecting pregnancy, antepartum, third trimester   - oxyCODONE-acetaminophen (PERCOCET/ROXICET) 5-325 MG tablet; Take 1-2 tablets by mouth every 6 (six) hours as needed for severe pain.  Dispense: 15 tablet; Refill: 0  Preterm labor symptoms and general obstetric precautions including but not limited to vaginal bleeding, contractions, leaking of fluid and fetal movement were reviewed in detail with the patient. Please refer to After Visit Summary for other counseling recommendations.  Return in about 2 weeks (around 03/01/2015).   Catalina AntiguaPeggy Zidan Helget, MD

## 2015-02-15 NOTE — Progress Notes (Signed)
Urine culture cancelled due to not enough urine to send for culture. Will call patient and ask to give another specimen.

## 2015-02-15 NOTE — Progress Notes (Signed)
Used Interpreter [name]. States having constipation and went to ER about 2 weeks ago. States stopped taking senna because it wasn't helping. States was told she had infection . Gave her enema.

## 2015-02-15 NOTE — Progress Notes (Signed)
Urinalysis shows moderate heomoglobin.

## 2015-02-15 NOTE — Addendum Note (Signed)
Addended by: Gerome ApleyZEYFANG, Aadon Gorelik L on: 02/15/2015 02:29 PM   Modules accepted: Orders

## 2015-02-15 NOTE — Addendum Note (Signed)
Addended byRaynald Blend: Jericha Bryden L on: 02/15/2015 11:06 AM   Modules accepted: Orders

## 2015-02-16 ENCOUNTER — Ambulatory Visit: Payer: Medicaid Other

## 2015-02-16 LAB — RPR

## 2015-02-16 LAB — HIV ANTIBODY (ROUTINE TESTING W REFLEX): HIV 1&2 Ab, 4th Generation: NONREACTIVE

## 2015-02-17 ENCOUNTER — Ambulatory Visit (INDEPENDENT_AMBULATORY_CARE_PROVIDER_SITE_OTHER): Payer: Medicaid Other | Admitting: *Deleted

## 2015-02-17 DIAGNOSIS — R319 Hematuria, unspecified: Secondary | ICD-10-CM

## 2015-02-17 NOTE — Progress Notes (Signed)
Was here 02/05/15 for obfu and urine culture ordered due to hematuria, unable to do culture due to patient only urinated a few drops.  Came today to give a specimen.

## 2015-02-18 ENCOUNTER — Other Ambulatory Visit: Payer: Self-pay

## 2015-02-18 DIAGNOSIS — E1169 Type 2 diabetes mellitus with other specified complication: Secondary | ICD-10-CM

## 2015-02-18 LAB — CULTURE, OB URINE
Colony Count: NO GROWTH
Organism ID, Bacteria: NO GROWTH

## 2015-02-18 MED ORDER — GLUCOSE BLOOD VI STRP: ORAL_STRIP | Status: DC

## 2015-02-18 MED ORDER — ACCU-CHEK FASTCLIX LANCETS MISC
1.0000 [IU] | Freq: Four times a day (QID) | Status: DC
Start: 2015-02-18 — End: 2017-10-15

## 2015-02-24 ENCOUNTER — Ambulatory Visit (HOSPITAL_COMMUNITY)
Admission: RE | Admit: 2015-02-24 | Discharge: 2015-02-24 | Disposition: A | Discharge status: Home / Self Care | Payer: Medicaid Other | Source: Ambulatory Visit | Attending: Family Medicine | Admitting: Family Medicine

## 2015-02-24 ENCOUNTER — Other Ambulatory Visit (HOSPITAL_COMMUNITY): Payer: Self-pay | Admitting: Obstetrics and Gynecology

## 2015-02-24 DIAGNOSIS — O358XX0 Maternal care for other (suspected) fetal abnormality and damage, not applicable or unspecified: Secondary | ICD-10-CM | POA: Diagnosis not present

## 2015-02-24 DIAGNOSIS — Z3A29 29 weeks gestation of pregnancy: Secondary | ICD-10-CM | POA: Insufficient documentation

## 2015-02-24 DIAGNOSIS — O99213 Obesity complicating pregnancy, third trimester: Secondary | ICD-10-CM

## 2015-02-24 DIAGNOSIS — O10019 Pre-existing essential hypertension complicating pregnancy, unspecified trimester: Secondary | ICD-10-CM | POA: Insufficient documentation

## 2015-02-24 DIAGNOSIS — O4403 Placenta previa specified as without hemorrhage, third trimester: Secondary | ICD-10-CM | POA: Diagnosis not present

## 2015-02-24 DIAGNOSIS — O24313 Unspecified pre-existing diabetes mellitus in pregnancy, third trimester: Secondary | ICD-10-CM

## 2015-02-24 DIAGNOSIS — O09523 Supervision of elderly multigravida, third trimester: Secondary | ICD-10-CM

## 2015-02-24 DIAGNOSIS — O359XX0 Maternal care for (suspected) fetal abnormality and damage, unspecified, not applicable or unspecified: Secondary | ICD-10-CM

## 2015-02-24 DIAGNOSIS — O0993 Supervision of high risk pregnancy, unspecified, third trimester: Secondary | ICD-10-CM

## 2015-02-25 ENCOUNTER — Telehealth: Payer: Self-pay

## 2015-02-25 NOTE — Telephone Encounter (Signed)
Patient called wanting to know if her urine culture results had been back Lisa Moreno CMA gave her results I interpreted patient was wondering about the strong odor her urine had but had not had any other symptoms. Lisa Moreno told her smell in her urine can be normal due to vitamins but if she developed any other symptoms she should call us back. Patient understood and agreed.

## 2015-03-01 ENCOUNTER — Ambulatory Visit (INDEPENDENT_AMBULATORY_CARE_PROVIDER_SITE_OTHER): Payer: Medicaid Other | Admitting: Family Medicine

## 2015-03-01 VITALS — BP 134/77 | HR 84 | Temp 98.1°F | Wt 240.0 lb

## 2015-03-01 DIAGNOSIS — O24419 Gestational diabetes mellitus in pregnancy, unspecified control: Secondary | ICD-10-CM

## 2015-03-01 DIAGNOSIS — E119 Type 2 diabetes mellitus without complications: Secondary | ICD-10-CM | POA: Diagnosis not present

## 2015-03-01 DIAGNOSIS — O4413 Placenta previa with hemorrhage, third trimester: Secondary | ICD-10-CM

## 2015-03-01 DIAGNOSIS — O0993 Supervision of high risk pregnancy, unspecified, third trimester: Secondary | ICD-10-CM

## 2015-03-01 DIAGNOSIS — O289 Unspecified abnormal findings on antenatal screening of mother: Secondary | ICD-10-CM

## 2015-03-01 DIAGNOSIS — Z794 Long term (current) use of insulin: Secondary | ICD-10-CM

## 2015-03-01 DIAGNOSIS — O24112 Pre-existing diabetes mellitus, type 2, in pregnancy, second trimester: Secondary | ICD-10-CM

## 2015-03-01 DIAGNOSIS — O4403 Placenta previa specified as without hemorrhage, third trimester: Secondary | ICD-10-CM

## 2015-03-01 DIAGNOSIS — O163 Unspecified maternal hypertension, third trimester: Secondary | ICD-10-CM

## 2015-03-01 DIAGNOSIS — O09523 Supervision of elderly multigravida, third trimester: Secondary | ICD-10-CM | POA: Diagnosis not present

## 2015-03-01 LAB — POCT URINALYSIS DIP (DEVICE)
Bilirubin Urine: NEGATIVE
GLUCOSE, UA: NEGATIVE mg/dL
Ketones, ur: NEGATIVE mg/dL
LEUKOCYTES UA: NEGATIVE
NITRITE: NEGATIVE
PH: 5.5 (ref 5.0–8.0)
Protein, ur: 30 mg/dL — AB
Specific Gravity, Urine: >=1.03 (ref 1.005–1.030)
UROBILINOGEN UA: 1 mg/dL (ref 0.0–1.0)

## 2015-03-01 MED ORDER — GLYBURIDE 2.5 MG PO TABS: ORAL_TABLET | ORAL | Status: DC

## 2015-03-01 MED ORDER — METFORMIN HCL 1000 MG PO TABS: 1000.0000 mg | ORAL_TABLET | Freq: Two times a day (BID) | ORAL | Status: DC

## 2015-03-01 NOTE — Patient Instructions (Addendum)
Metformin twice daily in morning and night Glyburide one and one half tablet in the morning  For the next week:  Please check fasting glucose every day in the morning before eating Please check 2 hours after all meals  Tercer trimestre de Psychiatrist (Third Trimester of Pregnancy) El tercer trimestre comprende desde la semana29 hasta la semana42, es decir, desde el mes7 hasta el mes9. El tercer trimestre es un perodo en el que el feto crece rpidamente. Hacia el final del noveno mes, el feto mide alrededor de 20pulgadas (45cm) de largo y pesa entre 6 y 10 libras (2,700 y 69,500kg).  CAMBIOS EN EL ORGANISMO Su organismo atraviesa por muchos cambios durante el [place], y estos varan de Neomia Dear mujer a Educational psychologist.   Seguir [employer]. Es de esperar que aumente entre 25 y 35libras (11 y 16kg) hacia el final del Psychiatrist.  Podrn aparecer las primeras Albertson's caderas, el abdomen y las [place].  Puede tener necesidad de Geographical information systems officer con ms frecuencia porque el feto baja hacia la pelvis y ejerce presin sobre la vejiga.  Debido al Vanetta Mulders podr sentir Anthoney Harada estomacal con frecuencia.  Puede estar estreida, ya que ciertas hormonas enlentecen los movimientos de los msculos que [employer] desechos a travs de los intestinos.  Pueden aparecer hemorroides o abultarse e hincharse las venas (venas varicosas).  Puede sentir dolor plvico debido al [employer] y a que las hormonas del Management consultant las articulaciones entre los huesos de la pelvis. El dolor de espalda puede ser consecuencia de la sobrecarga de los msculos que soportan la [place].  Tal vez haya cambios en el cabello que pueden incluir su engrosamiento, crecimiento rpido y cambios en la textura. Adems, a algunas mujeres se les cae el cabello durante o despus del embarazo, o tienen el cabello seco o fino. Lo ms probable es que el cabello se le normalice despus del nacimiento del beb.  Las ConAgra Foods seguirn creciendo y  Development worker, community. A veces, puede haber una secrecin amarilla de las mamas llamada calostro.  El ombligo puede salir hacia afuera.  Puede sentir que le falta el aire debido a que se expande el tero.  Puede notar que el feto "baja" o lo siente ms bajo, en el abdomen.  Puede tener una prdida de secrecin mucosa con sangre. Esto suele ocurrir en el trmino de unos [address] a una semana antes de que comience el [place].  El cuello del tero se vuelve delgado y blando (se borra) cerca de la fecha de [place]. QU DEBE ESPERAR EN LOS EXMENES PRENATALES  Le harn exmenes prenatales cada 2semanas hasta la semana36. A partir de ese momento le harn exmenes semanales. Durante una visita prenatal de rutina:  La pesarn para asegurarse de que usted y el feto estn creciendo normalmente.  Le tomarn la presin arterial.  Le medirn el abdomen para controlar el desarrollo del beb.  Se escucharn los latidos cardacos fetales.  Se evaluarn los resultados de los estudios solicitados en visitas anteriores.  Le revisarn el cuello del tero cuando est prxima la fecha de parto para controlar si este se ha borrado. Alrededor de la semana36, el mdico le revisar el cuello del tero. Al mismo tiempo, realizar un anlisis de las secreciones del tejido vaginal. Este examen es para determinar si hay un tipo de bacteria, estreptococo Grupo B. El mdico le explicar esto con ms detalle. El mdico puede preguntarle lo siguiente:  Cmo le gustara que fuera el [place].  Cmo se siente.  Si siente los movimientos del beb.  Si ha tenido sntomas anormales, como prdida de lquido, [place]angrado, dolores de cabeza intensos o clicos abdominales.  Si est consumiendo algn producto que contenga tabaco, como cigarrillos, tabaco de Theatre managermascar y Administrator, Civil Servicecigarrillos electrnicos.  Si tiene Colgate-Palmolivealguna pregunta. Otros exmenes o estudios de deteccin que pueden realizarse durante el tercer trimestre incluyen lo  siguiente:  Anlisis de sangre para controlar los niveles de hierro (anemia).  Controles fetales para determinar su salud, nivel de [place] the Grenadinesactividad y Designer, jewellerycrecimiento. Si tiene Jerseyalguna enfermedad o hay problemas durante el embarazo, le harn estudios.  Prueba del VIH (virus de inmunodeficiencia humana). Si corre Chiropodistun riesgo alto, pueden realizarle una prueba de deteccin del VIH durante el tercer trimestre del embarazo. FALSO TRABAJO DE PARTO Es posible que sienta contracciones leves e irregulares que finalmente desaparecen. Se llaman contracciones de [address] Pine StreetBraxton Hicks o falso trabajo de [place]. Las Fifth Third Bancorpcontracciones pueden durar horas, [address] o incluso semanas, antes de que el verdadero trabajo de parto se inicie. Si las contracciones ocurren a intervalos regulares, se intensifican o se hacen dolorosas, lo mejor es que la revise el mdico.  SIGNOS DE TRABAJO DE PARTO   Clicos de tipo menstrual.  Contracciones cada 5minutos o menos.  Contracciones que comienzan en la parte superior del tero y se extienden hacia abajo, a la zona inferior del abdomen y la espalda.  Sensacin de mayor presin en la pelvis o dolor de espalda.  Una secrecin de mucosidad acuosa o con sangre que sale de la vagina. Si tiene alguno de estos signos antes de la semana37 del Psychiatristembarazo, llame a su mdico de inmediato. Debe concurrir al hospital para que la controlen inmediatamente. INSTRUCCIONES PARA EL CUIDADO EN EL HOGAR   Evite fumar, consumir hierbas, beber alcohol y tomar frmacos que no le hayan recetado. Estas sustancias qumicas afectan la formacin y el desarrollo del beb.  No consuma ningn producto que contenga tabaco, lo que incluye cigarrillos, tabaco de Theatre managermascar y Administrator, Civil Servicecigarrillos electrnicos. Si necesita ayuda para dejar de fumar, consulte al American Expressmdico. Puede recibir asesoramiento y otro tipo de recursos para dejar de fumar.  Siga las indicaciones del mdico en relacin con el uso de medicamentos. Durante el embarazo, hay  medicamentos que son seguros de tomar y otros que no.  Haga ejercicio solamente como se lo haya indicado el mdico. Sentir clicos uterinos es un buen signo para Restaurant manager, fast fooddetener la actividad fsica.  Contine comiendo alimentos sanos con regularidad.  Use un sostn que le brinde buen soporte si le Altria Groupduelen las mamas.  No se d baos de inmersin en agua caliente, baos turcos ni saunas.  Use el cinturn de seguridad en todo momento mientras conduce.  No coma carne cruda ni queso sin cocinar; evite el contacto con las bandejas sanitarias de los gatos y la tierra que estos animales usan. Estos elementos contienen grmenes que pueden causar defectos congnitos en el beb.  Tome las vitaminas prenatales.  Tome entre 1500 y 2000mg  de calcio diariamente comenzando en la semana20 del embarazo [place]hasta el parto.  Si est estreida, pruebe un laxante suave (si el mdico lo autoriza). Consuma ms alimentos ricos en fibra, como vegetales y frutas frescos y Radiation protection practitionercereales integrales. Beba gran cantidad de lquido para mantener la orina de tono claro o color amarillo plido.  Dese baos de asiento con agua tibia para Engineer, materialsaliviar el dolor o las molestias causadas por las hemorroides. Use una crema para las hemorroides si el mdico la autoriza.  Si tiene venas  varicosas, use medias de descanso. Eleve los pies durante , 3 o 4veces por da. Limite el consumo de sal en su dieta.  Evite levantar objetos pesados, use zapatos de tacones bajos y [place].  Descanse con las piernas elevadas si tiene calambres o dolor de cintura.  Visite a su dentista si no lo ha Occupational hygienist. Use un cepillo de dientes blando para higienizarse los dientes y psese el hilo dental con suavidad.  Puede seguir [employer], a menos que el mdico le indique lo contrario.  No haga viajes largos excepto que sea absolutamente necesario y solo con la autorizacin del mdico.  Tome clases  prenatales para Financial trader, Education administrator y hacer preguntas sobre el [place] de parto y [place].  Haga un ensayo de la partida al hospital.  Prepare el bolso que llevar al hospital.  Prepare la habitacin del beb.  Concurra a todas las visitas prenatales segn las indicaciones de su mdico. SOLICITE ATENCIN MDICA SI:  No est segura de que est en trabajo de parto o de que ha roto la bolsa de las aguas.  Tiene mareos.  Siente clicos leves, presin en la pelvis o dolor persistente en el abdomen.  Tiene nuseas, vmitos o diarrea persistentes.  Brett Fairy secrecin vaginal con mal olor.  Siente dolor al ConocoPhillips. SOLICITE ATENCIN MDICA DE INMEDIATO SI:   Tiene fiebre.  Tiene una prdida de lquido por la vagina.  Tiene sangrado o pequeas prdidas vaginales.  Siente dolor intenso o clicos en el abdomen.  Sube o baja de peso rpidamente.  Tiene dificultad para respirar y siente dolor de pecho.  Sbitamente se le hinchan mucho el rostro, las [place], los tobillos, los pies o las piernas.  No ha sentido los movimientos del beb durante Georgianne Fick.  Siente un dolor de cabeza intenso que no se alivia con medicamentos.  Su visin se modifica.   Esta informacin no tiene Theme park manager el consejo del mdico. Asegrese de hacerle al mdico cualquier pregunta que tenga.   Document Released: 01/18/2005 Document Revised: 05/01/2014 Elsevier Interactive Patient Education [employer]

## 2015-03-01 NOTE — Progress Notes (Signed)
Subjective:  Lisa Moreno is a 39 y.o. G2P1001 at 3956w2d being seen today for ongoing prenatal care.  Patient reports no complaints.  Contractions: Not present.   . Movement: Present. Denies leaking of fluid.   The following portions of the patient's history were reviewed and updated as appropriate: allergies, current medications, past family history, past medical history, past social history, past surgical history and problem list. Problem list updated.  Objective:   Filed Vitals:   03/01/15 1126  BP: 134/77  Pulse: 84  Temp: 98.1 F (36.7 C)  Weight: 240 lb (108.863 kg)    Fetal Status: Fetal Heart Rate (bpm): 140 Fundal Height: 34 cm Movement: Present     General:  Alert, oriented and cooperative. Patient is in no acute distress.  Skin: Skin is warm and dry. No rash noted.   Cardiovascular: Normal heart rate noted  Respiratory: Normal respiratory effort, no problems with respiration noted  Abdomen: Soft, gravid, appropriate for gestational age. Pain/Pressure: Present     Pelvic:       Cervical exam deferred        Extremities: Normal range of motion.  Edema: None  Mental Status: Normal mood and affect. Normal behavior. Normal judgment and thought content.   Urinalysis: Urine Protein: 1+ Urine Glucose: Negative  Fasting 111-120 2hr PP 130-178  Assessment and Plan:  Pregnancy: G2P1001 at 5156w2d  1. Hypertension affecting pregnancy, antepartum, third trimester -Controlled today  2. Pregnancy with type 2 diabetes mellitus in second trimester (HCC) - having elevated BG - Will increase glipizide to 1 1/2 tabs in AM  3. Placenta previa centralis, third trimester -Scheduled CS@34 -36 weeks per MFM recommendations  4. Abnormal findings on antenatal screening - Spinal malformation and club feet  5. Advanced maternal age in multigravida, third trimester  6. Supervision of high risk pregnancy, antepartum, third trimester -Updated pregnancy box  Preterm labor  symptoms and general obstetric precautions including but not limited to vaginal bleeding, contractions, leaking of fluid and fetal movement were reviewed in detail with the patient. Please refer to After Visit Summary for other counseling recommendations.  Return in about 2 weeks (around 03/15/2015) for Routine prenatal care, start NST at this visit.   Federico FlakeKimberly Niles Petronella Shuford, MD  Future Appointments Date Time Provider Department Center  03/15/2015 10:30 AM [facility] NST [facility]  03/16/2015 10:30 AM Lars MassonKatarina H Nelson, MD [facility]  03/17/2015 10:30 AM [facility] US 2 [facility]-US 203

## 2015-03-08 ENCOUNTER — Telehealth: Payer: Self-pay | Admitting: *Deleted

## 2015-03-12 ENCOUNTER — Telehealth: Payer: Self-pay | Admitting: *Deleted

## 2015-03-12 NOTE — Telephone Encounter (Signed)
Per Harriett SineNancy unable to follow up on patient's cbgs due to c/o nausea and vomiting. Please call patient and follow up her cbg's and route to Dr. Alvester MorinNewton.  Called patient with Kenny[facility] Interpreter#[phone]. Called preferred number- mobile number and pateint Mother answered and she is in another country. Called home number and left a message we are calling to discuss something with you- please call clinic.

## 2015-03-15 ENCOUNTER — Ambulatory Visit (HOSPITAL_COMMUNITY)
Admission: RE | Admit: 2015-03-15 | Discharge: 2015-03-15 | Disposition: A | Discharge status: Home / Self Care | Payer: Medicaid Other | Source: Ambulatory Visit | Attending: Obstetrics and Gynecology | Admitting: Obstetrics and Gynecology

## 2015-03-15 ENCOUNTER — Ambulatory Visit (INDEPENDENT_AMBULATORY_CARE_PROVIDER_SITE_OTHER): Payer: Medicaid Other | Admitting: Obstetrics and Gynecology

## 2015-03-15 ENCOUNTER — Encounter: Payer: Self-pay | Admitting: Obstetrics and Gynecology

## 2015-03-15 VITALS — BP 128/68 | HR 78 | Wt 239.1 lb

## 2015-03-15 DIAGNOSIS — O24112 Pre-existing diabetes mellitus, type 2, in pregnancy, second trimester: Secondary | ICD-10-CM

## 2015-03-15 DIAGNOSIS — O10013 Pre-existing essential hypertension complicating pregnancy, third trimester: Secondary | ICD-10-CM | POA: Diagnosis not present

## 2015-03-15 DIAGNOSIS — O99213 Obesity complicating pregnancy, third trimester: Secondary | ICD-10-CM | POA: Insufficient documentation

## 2015-03-15 DIAGNOSIS — Z3A32 32 weeks gestation of pregnancy: Secondary | ICD-10-CM | POA: Insufficient documentation

## 2015-03-15 DIAGNOSIS — O163 Unspecified maternal hypertension, third trimester: Secondary | ICD-10-CM

## 2015-03-15 DIAGNOSIS — O09523 Supervision of elderly multigravida, third trimester: Secondary | ICD-10-CM | POA: Insufficient documentation

## 2015-03-15 DIAGNOSIS — O283 Abnormal ultrasonic finding on antenatal screening of mother: Secondary | ICD-10-CM | POA: Insufficient documentation

## 2015-03-15 DIAGNOSIS — O24113 Pre-existing diabetes mellitus, type 2, in pregnancy, third trimester: Secondary | ICD-10-CM | POA: Diagnosis not present

## 2015-03-15 DIAGNOSIS — O0993 Supervision of high risk pregnancy, unspecified, third trimester: Secondary | ICD-10-CM

## 2015-03-15 DIAGNOSIS — O289 Unspecified abnormal findings on antenatal screening of mother: Secondary | ICD-10-CM

## 2015-03-15 DIAGNOSIS — E1142 Type 2 diabetes mellitus with diabetic polyneuropathy: Secondary | ICD-10-CM

## 2015-03-15 DIAGNOSIS — O288 Other abnormal findings on antenatal screening of mother: Secondary | ICD-10-CM

## 2015-03-15 DIAGNOSIS — O4403 Placenta previa specified as without hemorrhage, third trimester: Secondary | ICD-10-CM | POA: Diagnosis not present

## 2015-03-15 DIAGNOSIS — O358XX0 Maternal care for other (suspected) fetal abnormality and damage, not applicable or unspecified: Secondary | ICD-10-CM | POA: Diagnosis not present

## 2015-03-15 DIAGNOSIS — O24313 Unspecified pre-existing diabetes mellitus in pregnancy, third trimester: Secondary | ICD-10-CM | POA: Diagnosis not present

## 2015-03-15 DIAGNOSIS — E119 Type 2 diabetes mellitus without complications: Secondary | ICD-10-CM

## 2015-03-15 DIAGNOSIS — O4413 Placenta previa with hemorrhage, third trimester: Secondary | ICD-10-CM

## 2015-03-15 MED ORDER — OXYCODONE-ACETAMINOPHEN 5-325 MG PO TABS: 1.0000 | ORAL_TABLET | Freq: Four times a day (QID) | ORAL | Status: DC | PRN

## 2015-03-15 NOTE — Progress Notes (Signed)
Subjective:  Lisa Moreno is a 39 y.o. G2P1001 at 6324w2d being seen today for ongoing prenatal care.  She is currently monitored for the following issues for this high-risk pregnancy: Patient Active Problem List   Diagnosis Date Noted  . HTN (hypertension) 12/11/2014  . Placenta previa centralis 12/10/2014  . Abnormal findings on antenatal screening   . Advanced maternal age in multigravida   . Supervision of high risk pregnancy, antepartum 12/07/2014  . Pregnancy with type 2 diabetes mellitus in second trimester (HCC) 11/09/2014  . Hypertension affecting pregnancy, antepartum 11/09/2014  . Rapid heart beat 11/09/2014  . Type 2 diabetes mellitus without complication (HCC) 03/30/2014  . Morbid obesity (HCC) 03/30/2014  . Fibromyalgia 01/08/2013   Patient reports feeling fatigued. She reports resolution of a 1-week history of nausea and emesis. She is afraid of becoming hypoglycemic as she is alone at home. She has changed her glyburide dosing from 7.5 mg to 5 mg as a result..  Contractions: Not present. Vag. Bleeding: None.  Movement: Present. Denies leaking of fluid.   The following portions of the patient's history were reviewed and updated as appropriate: allergies, current medications, past family history, past medical history, past social history, past surgical history and problem list. Problem list updated.  Objective:   Filed Vitals:   03/15/15 1116  BP: 128/68  Pulse: 78  Weight: 239 lb 1.6 oz (108.455 kg)    Fetal Status: Fetal Heart Rate (bpm): NST   Movement: Present     General:  Alert, oriented and cooperative. Patient is in no acute distress.  Skin: Skin is warm and dry. No rash noted.   Cardiovascular: Normal heart rate noted  Respiratory: Normal respiratory effort, no problems with respiration noted  Abdomen: Soft, gravid, appropriate for gestational age. Pain/Pressure: Present     Pelvic: Vag. Bleeding: None     Cervical exam deferred         Extremities: Normal range of motion.     Mental Status: Normal mood and affect. Normal behavior. Normal judgment and thought content.   Urinalysis:      Assessment and Plan:  Pregnancy: G2P1001 at 1724w2d  1. Hypertension affecting pregnancy, antepartum, third trimester - US MFM FETAL BPP WO NON STRESS; Future  2. Pre-existing type 2 diabetes mellitus in pregnancy in third trimester CBGs reviewed. All values were normal until the patient decreased her dosing and all values were elevated as high as 225 Patient advised to return to the prescribed dosing NST reviewed and not reactive- patient will go to radiology suite today for BPP - US MFM FETAL BPP WO NON STRESS; Future  3. Type 2 diabetes mellitus without complication, unspecified long term insulin use status (HCC)   4. Supervision of high risk pregnancy, antepartum, third trimester   5. Pregnancy with type 2 diabetes mellitus in second trimester (HCC)   6. Placenta previa centralis, third trimester Abstain from intercourse Follow up ultrasound scheduled on 11/23  7. Advanced maternal age in multigravida, third trimester Declined further testing  Preterm labor symptoms and general obstetric precautions including but not limited to vaginal bleeding, contractions, leaking of fluid and fetal movement were reviewed in detail with the patient. Please refer to After Visit Summary for other counseling recommendations.  Return in about 1 week (around 03/22/2015) for as scheduled.   Catalina AntiguaPeggy Jazmine Longshore, MD

## 2015-03-15 NOTE — Progress Notes (Signed)
Interpreter Carol Hernandez present for encounter.  Breastfeeding tip of the week reviewed.  

## 2015-03-16 ENCOUNTER — Encounter: Payer: Self-pay | Admitting: Cardiology

## 2015-03-16 ENCOUNTER — Telehealth: Payer: Self-pay

## 2015-03-16 ENCOUNTER — Ambulatory Visit (INDEPENDENT_AMBULATORY_CARE_PROVIDER_SITE_OTHER): Payer: Medicaid Other | Admitting: Cardiology

## 2015-03-16 VITALS — BP 124/70 | HR 84 | Ht 63.0 in | Wt 241.0 lb

## 2015-03-16 DIAGNOSIS — R072 Precordial pain: Secondary | ICD-10-CM | POA: Diagnosis not present

## 2015-03-16 DIAGNOSIS — R0609 Other forms of dyspnea: Secondary | ICD-10-CM

## 2015-03-16 DIAGNOSIS — R0600 Dyspnea, unspecified: Secondary | ICD-10-CM

## 2015-03-16 DIAGNOSIS — Z3490 Encounter for supervision of normal pregnancy, unspecified, unspecified trimester: Secondary | ICD-10-CM

## 2015-03-16 NOTE — Telephone Encounter (Signed)
The pt had OV with Dr Delton SeeNelson this morning at 10:30. At that time Dr Delton SeeNelson noted that the pt was doing great. Her BP was 124/70 with a HR of 84. The pt is [redacted] weeks pregnant and a C section is planned sometime before Christmas.  After her appt with Dr Delton SeeNelson the pt went to the lobby downstairs to wait for her ride.  At about 2:00 pm the pt came back up to this office with c/o being tired, weak and feeling "body pressure". She denies CP, SOB, dizziness and/or contractions. BP was checked and it was 118/72 and her HR was 79 bpm. She checked her BS and it was 164.  The pts ride arrived and per the pts request she was brought into the room while I was with the pt. The pt stated that she was feeling a little better now that her friend was here and that she did not want to sit in lobby downstairs alone feeling the way she was. The pt was given reassurance and advised to call her OBGYN as soon as she gets home to discuss her sx. She verbalized understanding and states that she will contact her OBGYN when she gets home. The pt left the office with her friend who stated that she would drive the pt home.

## 2015-03-16 NOTE — Progress Notes (Signed)
Patient ID: Lisa Moreno, female   DOB: July 12, 1975, 39 y.o.   MRN: [id number]      Cardiology Office Note  Date:  03/16/2015   ID:  Lisa Moreno, [place] 23-Apr-1976, MRN [id number]  PCP:  Lisa Chessman, MD  Cardiologist: Dorothy Spark, MD   Chief complain:    History of Present Illness: Lisa Moreno is a 39 y.o. female who presents for evaluation of chest pain and dyspnea on exertion. She has developed worsening DOE during the pregnancy, currently in 58. Week. She has chest pain and neck pain for about a week and feels like her occipital area is burning. No syncope. She had hypertension prior to the pregnancy and was using lisinopril that was discontinued. Her BP gets high at the time but was ok on the last two doctors visits. She doesn't have BP cuff at home.  No LE edema, orthopnea, PND.   01/22/2015 - 6 weeks follow up, she continues to have SOB - minimal at rest, worse on exertion, unable to walk more than 1 flight of stairs, has to rest. 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 No palpitations or syncope, minimal LE edema, no orthopnea or PND.   03/16/2015 - she is now 32 weeks, 3 days pregnant, no complications, denies chest pain, stable DOE, no orthopnea, LE edema, PND. No palpitations or syncope, no hypertension.    Past Medical History  Diagnosis Date  . Asthma   . Hypertension   . Diabetes mellitus without complication ([name])   . GERD (gastroesophageal reflux disease)   . Gestational diabetes    Past Surgical History  Procedure Laterality Date  . Breast reduction surgery    . Tonsillectomy     Current Outpatient Prescriptions  Medication Sig Dispense Refill  . ACCU-CHEK FASTCLIX LANCETS MISC 1 Units by Percutaneous route 4 (four) times daily. 100 each 12  . albuterol (PROVENTIL HFA;VENTOLIN HFA) 108 (90 BASE) MCG/ACT inhaler Inhale 2 puffs into  the lungs every 6 (six) hours as needed for wheezing. 1 Inhaler 11  . albuterol (PROVENTIL) (5 MG/ML) 0.5% nebulizer solution Take 2.5 mg by nebulization every 6 (six) hours as needed for wheezing or shortness of breath.    Marland Kitchen aspirin 81 MG tablet Take 1 tablet (81 mg total) by mouth daily. 30 tablet 12  . Blood Glucose Monitoring Suppl (ACCU-CHEK NANO SMARTVIEW) W/DEVICE KIT 1 Device by Does not apply route 4 (four) times daily. 1 kit 0  . gabapentin (NEURONTIN) 300 MG capsule Take 1 capsule (300 mg total) by mouth at bedtime. 30 capsule 11  . glucose blood test strip Use as instructed to check blood glucose 4 times daily. 100 each 12  . glucose blood test strip Use as instructed 100 each 12  . glyBURIDE (DIABETA) 2.5 MG tablet Take 1 1/2 tablet in the morning 45 tablet 3  . metFORMIN (GLUCOPHAGE) 1000 MG tablet Take 1 tablet (1,000 mg total) by mouth 2 (two) times daily with a meal. 30 tablet 3  . nystatin cream (MYCOSTATIN) Apply 1 application topically 2 (two) times daily. (Patient taking differently: Apply 1 application topically 2 (two) times daily as needed. ) 30 g 1  . OVER THE COUNTER MEDICATION 1 capsule daily. Over the counter stool softner with laxative.    Marland Kitchen OVER THE COUNTER MEDICATION 1 tablet daily as needed. Over the counter sleep medicine.    Marland Kitchen  oxyCODONE-acetaminophen (PERCOCET/ROXICET) 5-325 MG tablet Take 1-2 tablets by mouth every 6 (six) hours as needed for severe pain. 15 tablet 0  . Prenatal Vit-Fe Fumarate-FA (PRENATAL MULTIVITAMIN) TABS tablet Take 1 tablet by mouth daily at 12 noon.    . ranitidine (ZANTAC) 150 MG tablet Take 1 tablet (150 mg total) by mouth 2 (two) times daily. 60 tablet 2   No current facility-administered medications for this visit.   Allergies:   Decadron   Social History:  The patient  reports that she has never smoked. She has never used smokeless tobacco. She reports that she does not drink alcohol or use illicit drugs.   Family History:  The  patient's family history includes Depression in her mother; Diabetes in her father and mother; Hypertension in her mother; Kidney disease in her father; Stroke in her father and mother.   ROS:  Please see the history of present illness.   Otherwise, review of systems are positive for none.   All other systems are reviewed and negative.   PHYSICAL EXAM: VS:  BP 124/70 mmHg  Pulse 84  Ht _0  (1.6 m)  Wt 241 lb (109.317 kg)  BMI 42.70 kg/m2  LMP 08/05/2014 , BMI Body mass index is 42.7 kg/(m^2). GEN: Well nourished, well developed, in no acute distress HEENT: normal Neck: no JVD, carotid bruits, or masses Cardiac: RRR; no murmurs, rubs, or gallops,no edema  Respiratory:  clear to auscultation bilaterally, normal work of breathing GI: soft, nontender, consistent with 3.trimester of pregnancy MS: no deformity or atrophy Skin: warm and dry, no rash Neuro:  Strength and sensation are intact Psych: euthymic mood, full affect  EKG:  EKG is not ordered today. The ekg ordered today demonstrates SR, iRBBB  Recent Labs: 03/30/2014: TSH 1.483 11/11/2014: ALT 15; BUN 9; Creat 0.43*; Potassium 4.0; Sodium 137 02/15/2015: Hemoglobin 10.6*; Platelets 312   Lipid Panel    Component Value Date/Time   CHOL 148 03/30/2014 1547   TRIG 101 03/30/2014 1547   HDL 38* 03/30/2014 1547   CHOLHDL 3.9 03/30/2014 1547   VLDL 20 03/30/2014 1547   LDLCALC 90 03/30/2014 1547   Wt Readings from Last 3 Encounters:  03/16/15 241 lb (109.317 kg)  03/15/15 239 lb 1.6 oz (108.455 kg)  03/01/15 240 lb (108.863 kg)    TTE: 12/17/2014  - Left ventricle: The cavity size was mildly dilated. Wall thickness was normal. Systolic function was normal. The estimated ejection fraction was in the range of 55% to 60%. Wall motion was normal; there were no regional wall motion abnormalities. Left ventricular diastolic function parameters were normal. - Left atrium: The atrium was mildly  dilated.  Impressions: - Normal LV systolic and diastolic function; mild LVE and LAE; trace MR and TR.    ASSESSMENT AND PLAN:  1.  Chest pain - seems very atypical, echocardiogram is completely normal, mildly dilated LV can be attributed to increased plasma volume during pregnancy, LVEF is normal, normal diastolic function.  2. DOE - normal echo, most probably normal changes of pregnancy  3. Hypertension - currently normotensive - we will start labetalol if becomes hypertensive  She is doing great, she will undergo a C section sec to placenta previa sometimes before Christmas, we will see her in the first half of January 2017 unless her condition deteriorates.     Signed, Dorothy Spark, MD  03/16/2015 11:23 AM    [address], [place], [place]  [address] Phone: [phone]; Fax: [phone]

## 2015-03-16 NOTE — Patient Instructions (Signed)
Your physician recommends that you continue on your current medications as directed. Please refer to the Current Medication list given to you today.     Your physician recommends that you schedule a follow-up appointment in: WITH DR Delton SeeNELSON IN THE 1ST OR 2ND WEEK OF January 2017, FOR POST-PREGNANCY FOLLOW-UP

## 2015-03-17 ENCOUNTER — Ambulatory Visit (HOSPITAL_COMMUNITY)
Admission: RE | Admit: 2015-03-17 | Discharge: 2015-03-17 | Disposition: A | Discharge status: Home / Self Care | Payer: Medicaid Other | Source: Ambulatory Visit | Attending: Obstetrics and Gynecology | Admitting: Obstetrics and Gynecology

## 2015-03-17 DIAGNOSIS — O10013 Pre-existing essential hypertension complicating pregnancy, third trimester: Secondary | ICD-10-CM | POA: Insufficient documentation

## 2015-03-17 DIAGNOSIS — O09523 Supervision of elderly multigravida, third trimester: Secondary | ICD-10-CM | POA: Diagnosis not present

## 2015-03-17 DIAGNOSIS — O24313 Unspecified pre-existing diabetes mellitus in pregnancy, third trimester: Secondary | ICD-10-CM | POA: Diagnosis not present

## 2015-03-17 DIAGNOSIS — O0993 Supervision of high risk pregnancy, unspecified, third trimester: Secondary | ICD-10-CM

## 2015-03-17 DIAGNOSIS — O99213 Obesity complicating pregnancy, third trimester: Secondary | ICD-10-CM | POA: Diagnosis not present

## 2015-03-17 DIAGNOSIS — O4403 Placenta previa specified as without hemorrhage, third trimester: Secondary | ICD-10-CM | POA: Diagnosis not present

## 2015-03-17 DIAGNOSIS — Z3A32 32 weeks gestation of pregnancy: Secondary | ICD-10-CM | POA: Insufficient documentation

## 2015-03-17 DIAGNOSIS — O163 Unspecified maternal hypertension, third trimester: Secondary | ICD-10-CM

## 2015-03-17 DIAGNOSIS — O358XX0 Maternal care for other (suspected) fetal abnormality and damage, not applicable or unspecified: Secondary | ICD-10-CM | POA: Diagnosis present

## 2015-03-17 DIAGNOSIS — O24113 Pre-existing diabetes mellitus, type 2, in pregnancy, third trimester: Secondary | ICD-10-CM

## 2015-03-17 NOTE — Telephone Encounter (Signed)
Patient is transferring care to another office next week.

## 2015-03-17 NOTE — Telephone Encounter (Addendum)
Called patient with Interpreter Amanda PeaAlvi Almonte. Left message we are calling to talk with you, please call clinic back today or Monday as we are closed Thursday and Friday. Called mobile number and spoke with pt. Mother who said patient not there , call her other number.

## 2015-03-20 ENCOUNTER — Encounter (HOSPITAL_COMMUNITY): Payer: Self-pay

## 2015-03-20 ENCOUNTER — Inpatient Hospital Stay (HOSPITAL_COMMUNITY)
Admission: AD | Admit: 2015-03-20 | Discharge: 2015-03-20 | Disposition: A | Discharge status: Home / Self Care | Payer: Medicaid Other | Source: Ambulatory Visit | Attending: Obstetrics & Gynecology | Admitting: Obstetrics & Gynecology

## 2015-03-20 DIAGNOSIS — K5900 Constipation, unspecified: Secondary | ICD-10-CM | POA: Insufficient documentation

## 2015-03-20 DIAGNOSIS — Z8249 Family history of ischemic heart disease and other diseases of the circulatory system: Secondary | ICD-10-CM | POA: Diagnosis not present

## 2015-03-20 DIAGNOSIS — O99613 Diseases of the digestive system complicating pregnancy, third trimester: Secondary | ICD-10-CM | POA: Diagnosis not present

## 2015-03-20 DIAGNOSIS — K6289 Other specified diseases of anus and rectum: Secondary | ICD-10-CM | POA: Insufficient documentation

## 2015-03-20 DIAGNOSIS — O26893 Other specified pregnancy related conditions, third trimester: Secondary | ICD-10-CM | POA: Diagnosis present

## 2015-03-20 DIAGNOSIS — Z7984 Long term (current) use of oral hypoglycemic drugs: Secondary | ICD-10-CM | POA: Insufficient documentation

## 2015-03-20 DIAGNOSIS — O10913 Unspecified pre-existing hypertension complicating pregnancy, third trimester: Secondary | ICD-10-CM | POA: Insufficient documentation

## 2015-03-20 DIAGNOSIS — Z7982 Long term (current) use of aspirin: Secondary | ICD-10-CM | POA: Diagnosis not present

## 2015-03-20 DIAGNOSIS — Z3A33 33 weeks gestation of pregnancy: Secondary | ICD-10-CM | POA: Diagnosis not present

## 2015-03-20 DIAGNOSIS — K219 Gastro-esophageal reflux disease without esophagitis: Secondary | ICD-10-CM | POA: Diagnosis not present

## 2015-03-20 DIAGNOSIS — Z79899 Other long term (current) drug therapy: Secondary | ICD-10-CM | POA: Insufficient documentation

## 2015-03-20 DIAGNOSIS — Z818 Family history of other mental and behavioral disorders: Secondary | ICD-10-CM | POA: Diagnosis not present

## 2015-03-20 DIAGNOSIS — Z823 Family history of stroke: Secondary | ICD-10-CM | POA: Diagnosis not present

## 2015-03-20 DIAGNOSIS — O24113 Pre-existing diabetes mellitus, type 2, in pregnancy, third trimester: Secondary | ICD-10-CM | POA: Insufficient documentation

## 2015-03-20 DIAGNOSIS — R109 Unspecified abdominal pain: Secondary | ICD-10-CM | POA: Diagnosis not present

## 2015-03-20 DIAGNOSIS — E119 Type 2 diabetes mellitus without complications: Secondary | ICD-10-CM | POA: Diagnosis not present

## 2015-03-20 DIAGNOSIS — J45909 Unspecified asthma, uncomplicated: Secondary | ICD-10-CM | POA: Insufficient documentation

## 2015-03-20 MED ORDER — SORBITOL 70 % SOLN
30.0000 mL | Freq: Once | Status: AC
  Administered 2015-03-20: 30 mL via ORAL
  Filled 2015-03-20: qty 30

## 2015-03-20 MED ORDER — ONDANSETRON HCL 4 MG PO TABS
4.0000 mg | ORAL_TABLET | Freq: Once | ORAL | Status: AC
  Administered 2015-03-20: 4 mg via ORAL
  Filled 2015-03-20: qty 1

## 2015-03-20 MED ORDER — SORBITOL 70 % PO SOLN: 15.0000 mL | ORAL | Status: DC | PRN

## 2015-03-20 MED ORDER — MORPHINE SULFATE (PF) 10 MG/ML IV SOLN
10.0000 mg | Freq: Once | INTRAVENOUS | Status: AC
  Administered 2015-03-20: 10 mg via INTRAMUSCULAR
  Filled 2015-03-20: qty 1

## 2015-03-20 MED ORDER — MAGNESIUM CITRATE PO SOLN: 1.0000 | Freq: Once | ORAL | Status: DC

## 2015-03-20 MED ORDER — SORBITOL 70 % SOLN: 20.0000 mL | Freq: Once | Status: DC

## 2015-03-20 NOTE — MAU Provider Note (Signed)
History     CSN: [id number]  Arrival date and time: 03/20/15 1701   First Provider Initiated Contact with Patient 03/20/15 1906      Chief Complaint  Patient presents with  . Constipation   HPI   Patient is 39 y.o. G2P1001 at 70w0dpresents to MAU with significant rectal pain and pressure. Pt has not had a BM in 4 days. She is take several medications that can cause constipation which were started for abdominal pain associated with this pregnancy including percocet and flexeril.  She has been taking a stool softner, mirlax and eating plenty of fruit. She has not used any enemas.    OB History    Gravida Para Term Preterm AB TAB SAB Ectopic Multiple Living   2 1 1  0 0 0 0 0 0 1      Past Medical History  Diagnosis Date  . Asthma   . Hypertension   . Diabetes mellitus without complication ([facility]   . GERD (gastroesophageal reflux disease)   . Gestational diabetes     Past Surgical History  Procedure Laterality Date  . Breast reduction surgery    . Tonsillectomy      Family History  Problem Relation Age of Onset  . Diabetes Mother   . Stroke Mother   . Hypertension Mother   . Depression Mother   . Diabetes Father   . Stroke Father   . Kidney disease Father     Social History  Substance Use Topics  . Smoking status: Never Smoker   . Smokeless tobacco: Never Used  . Alcohol Use: No    Allergies:  Allergies  Allergen Reactions  . Decadron [Dexamethasone] Other (See Comments)    Intense burning vaginal/rectal area.     Prescriptions prior to admission  Medication Sig Dispense Refill Last Dose  . albuterol (PROVENTIL HFA;VENTOLIN HFA) 108 (90 BASE) MCG/ACT inhaler Inhale 2 puffs into the lungs every 6 (six) hours as needed for wheezing. 1 Inhaler 11 Past Month at Unknown time  . albuterol (PROVENTIL) (5 MG/ML) 0.5% nebulizer solution Take 2.5 mg by nebulization every 6 (six) hours as needed for wheezing or shortness of breath.   rescue  . aspirin 81 MG  tablet Take 1 tablet (81 mg total) by mouth daily. 30 tablet 12 03/19/2015 at Unknown time  . gabapentin (NEURONTIN) 300 MG capsule Take 1 capsule (300 mg total) by mouth at bedtime. 30 capsule 11 03/19/2015 at Unknown time  . glyBURIDE (DIABETA) 2.5 MG tablet Take 1 1/2 tablet in the morning 45 tablet 3 03/20/2015 at Unknown time  . metFORMIN (GLUCOPHAGE) 1000 MG tablet Take 1 tablet (1,000 mg total) by mouth 2 (two) times daily with a meal. 30 tablet 3 03/20/2015 at Unknown time  . nystatin cream (MYCOSTATIN) Apply 1 application topically 2 (two) times daily. (Patient taking differently: Apply 1 application topically 2 (two) times daily as needed. ) 30 g 1 Past Month at Unknown time  . OVER THE COUNTER MEDICATION 1 capsule daily. Over the counter stool softner with laxative.   03/20/2015 at Unknown time  . OVER THE COUNTER MEDICATION 1 tablet daily as needed. Over the counter sleep medicine.   Past Week at Unknown time  . oxyCODONE-acetaminophen (PERCOCET/ROXICET) 5-325 MG tablet Take 1-2 tablets by mouth every 6 (six) hours as needed for severe pain. 15 tablet 0 03/20/2015 at Unknown time  . polyethylene glycol powder (GLYCOLAX/MIRALAX) powder Take 17-34 g by mouth daily as needed for moderate constipation.  03/20/2015 at Unknown time  . ranitidine (ZANTAC) 150 MG tablet Take 1 tablet (150 mg total) by mouth 2 (two) times daily. 60 tablet 2 03/20/2015 at Unknown time  . ACCU-CHEK FASTCLIX LANCETS MISC 1 Units by Percutaneous route 4 (four) times daily. 100 each 12 Taking  . Blood Glucose Monitoring Suppl (ACCU-CHEK NANO SMARTVIEW) W/DEVICE KIT 1 Device by Does not apply route 4 (four) times daily. 1 kit 0 Taking  . glucose blood test strip Use as instructed to check blood glucose 4 times daily. 100 each 12 Taking  . glucose blood test strip Use as instructed 100 each 12 Taking    ROS Physical Exam   Blood pressure 134/52, pulse 72, temperature 98.8 F (37.1 C), temperature source Oral, resp.  rate 18, height 5' 3"  (1.6 m), weight 108.863 kg (240 lb), last menstrual period 08/05/2014.  Physical Exam  Constitutional: She is oriented to person, place, and time. She appears well-developed.  Cardiovascular: Normal rate, regular rhythm, normal heart sounds and intact distal pulses.  Exam reveals no friction rub.   No murmur heard. Respiratory: Effort normal. No respiratory distress. She has no wheezes.  GI: Soft. Bowel sounds are normal. She exhibits no distension. There is no tenderness.  Genitourinary:  Patient with significant fecal impaction. Large hard stool was disimpacted   Musculoskeletal: Normal range of motion. She exhibits no edema.  Neurological: She is alert and oriented to person, place, and time.  Skin: Skin is warm and dry.    MAU Course  Procedures  Assessment and Plan   Fecal Impaction/Constipation. Patient has been disimpacted. Patient received a soap sud enema. Patient has several medications including percocet and flexeril that can cause constipation. 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 Once daily. Patient to continue stool softener  - Patient should stop Flexeril (as not helping and could be constipation)    Care taken over from Dr. Emmaline Life. Pt. Feeling better after disimpaction and enema.   Plan - as above.  - Additionally, will give Sorbitol po q4 hrs prn at home.  - Mag Citrate if sorbitol is ineffective at home.  - Return precautions reviewed including, nausea, vomiting, severe abdominal pain, or continued constipation in spite of outpatient treatment.  - Pt. To use as little pain medicine as possible at home to avoid worsening of her constipation.   Caleb Melancon  03/20/2015 8:50 PM  CNM attestation:  I have seen and examined this patient; I agree with above  documentation in the resident's note.   Lisa Moreno is a 39 y.o. G2P1001 reporting severe constipation/abd pain +FM, denies LOF, VB, contractions, vaginal discharge.  PE: BP 113/72 mmHg  Pulse 73  Temp(Src) 98.6 F (37 C) (Oral)  Resp 18  Ht 5' 3"  (1.6 m)  Wt 108.863 kg (240 lb)  BMI 42.52 kg/m2  LMP 08/05/2014 Gen: calm comfortable, NAD Resp: normal effort, no distress Abd: gravid  ROS, labs, PMH reviewed NST reactive +accels, no decels; no ctx  Plan: - Continue w/ Miralax BID, stool softener, in addition rx for Sorbitol and Mag Citrate given - continue routine follow up in OB clinic  Serita Grammes, CNM 11:10 PM  03/20/2015

## 2015-03-20 NOTE — Progress Notes (Signed)
Pt in bathroom. RN attempt to administer soap suds enema. Attempted to insert x3, when attempting pt very uncomfortable and saying "Stop, stop". Fluid from enema running on floor. Unable to administer. Provider notified.

## 2015-03-20 NOTE — Progress Notes (Signed)
Soap suds enema successfully given to pt.

## 2015-03-20 NOTE — Progress Notes (Signed)
Dr. Cathlean CowerMikell at bedside and attempting manual disimpaction. Pt unable to tolerate and loudly saying "Stop, stop"! Dr. Cathlean CowerMikell to consult with CNM.

## 2015-03-20 NOTE — MAU Note (Signed)
Pt c/o severe abdominal pain that she relates to constipation. Pt states "I just need an enema, this has happened before." Pt's last BM four days ago. Pt denies leaking, and bleeding. Pt states she has a previa and has been straining to have a BM.

## 2015-03-20 NOTE — Progress Notes (Signed)
Manual disimpaction done by dr Cathlean Cowermikell

## 2015-03-20 NOTE — Discharge Instructions (Signed)
Constipation, Adult Constipation is when a person has fewer than three bowel movements a week, has difficulty having a bowel movement, or has stools that are dry, hard, or larger than normal. As people grow older, constipation is more common. A low-fiber diet, not taking in enough fluids, and taking certain medicines may make constipation worse.  CAUSES   Certain medicines, such as antidepressants, pain medicine, iron supplements, antacids, and water pills.   Certain diseases, such as diabetes, irritable bowel syndrome (IBS), thyroid disease, or depression.   Not drinking enough water.   Not eating enough fiber-rich foods.   Stress or travel.   Lack of physical activity or exercise.   Ignoring the urge to have a bowel movement.   Using laxatives too much.  SIGNS AND SYMPTOMS   Having fewer than three bowel movements a week.   Straining to have a bowel movement.   Having stools that are hard, dry, or larger than normal.   Feeling full or bloated.   Pain in the lower abdomen.   Not feeling relief after having a bowel movement.  DIAGNOSIS  Your health care provider will take a medical history and perform a physical exam. Further testing may be done for severe constipation. Some tests may include:  A barium enema X-ray to examine your rectum, colon, and, sometimes, your small intestine.   A sigmoidoscopy to examine your lower colon.   A colonoscopy to examine your entire colon. TREATMENT  Treatment will depend on the severity of your constipation and what is causing it. Some dietary treatments include drinking more fluids and eating more fiber-rich foods. Lifestyle treatments may include regular exercise. If these diet and lifestyle recommendations do not help, your health care provider may recommend taking over-the-counter laxative medicines to help you have bowel movements. Prescription medicines may be prescribed if over-the-counter medicines do not work.    HOME CARE INSTRUCTIONS   Eat foods that have a lot of fiber, such as fruits, vegetables, whole grains, and beans.  Limit foods high in fat and processed sugars, such as french fries, hamburgers, cookies, candies, and soda.   A fiber supplement may be added to your diet if you cannot get enough fiber from foods.   Drink enough fluids to keep your urine clear or pale yellow.   Exercise regularly or as directed by your health care provider.   Go to the restroom when you have the urge to go. Do not hold it.   Only take over-the-counter or prescription medicines as directed by your health care provider. Do not take other medicines for constipation without talking to your health care provider first.  SEEK IMMEDIATE MEDICAL CARE IF:   You have bright red blood in your stool.   Your constipation lasts for more than 4 days or gets worse.   You have abdominal or rectal pain.   You have thin, pencil-like stools.   You have unexplained weight loss. MAKE SURE YOU:   Understand these instructions.  Will watch your condition.  Will get help right away if you are not doing well or get worse.   This information is not intended to replace advice given to you by your health care provider. Make sure you discuss any questions you have with your health care provider.   Document Released: 01/07/2004 Document Revised: 05/01/2014 Document Reviewed: 01/20/2013 Elsevier Interactive Patient Education 2016 [employer]   Fecal Impaction A fecal impaction happens when there is a large, firm amount of stool (or feces) that  cannot be passed. The impacted stool is usually in the rectum, which is the lowest part of the large bowel. The impacted stool can block the colon and cause significant problems. CAUSES  The longer stool stays in the rectum, the harder it gets. Anything that slows down your bowel movements can lead to fecal impaction, such as:  Constipation. This can be a long-standing  (chronic) problem or can happen suddenly (acute).  Painful conditions of the rectum, such as hemorrhoids or anal fissures. The pain of these conditions can make you try to avoid having bowel movements.  Narcotic pain-relieving medicines, such as methadone, morphine, or codeine.  Not drinking enough fluids.  Inactivity and bed rest over long periods of time.  Diseases of the brain or nervous system that damage the nerves controlling the muscles of the intestines. SIGNS AND SYMPTOMS   Lack of normal bowel movements or changes in bowel patterns.  Sense of fullness in the rectum but unable to pass stool.  Pain or cramps in the abdominal area (often after meals).  Thin, watery discharge from the rectum. DIAGNOSIS  Your health care provider may suspect that you have a fecal impaction based on your symptoms and a physical exam. This will include an exam of your rectum. Sometimes X-rays or lab testing may be needed to confirm the diagnosis and to be sure there are no other problems.  TREATMENT   Initially an impaction can be removed manually. Using a gloved finger, your health care provider can remove hard stool from your rectum.  Medicine is sometimes needed. A suppository or enema can be given in the rectum to soften the stool, which can stimulate a bowel movement. Medicines can also be given by mouth (orally).  Though rare, surgery may be needed if the colon has torn (perforated) due to blockage. HOME CARE INSTRUCTIONS   Develop regular bowel habits. This could include getting in the habit of having a bowel movement after your morning cup of coffee or after eating. Be sure to allow yourself enough time on the toilet.  Maintain a high-fiber diet.  Drink enough fluids to keep your urine clear or pale yellow as directed by your health care provider.  Exercise regularly.  If you begin to get constipated, increase the amount of fiber in your diet. Eat plenty of fruits, vegetables, whole  wheat breads, bran, oatmeal, and similar products.  Take natural fiber laxatives or other laxatives only as directed by your health care provider. SEEK MEDICAL CARE IF:   You have ongoing rectal pain.  You require enemas or suppositories more than twice a week.  You have rectal bleeding.  You have continued problems, or you develop abdominal pain.  You have thin, pencil-like stools. SEEK IMMEDIATE MEDICAL CARE IF:  You have black or tarry stools. MAKE SURE YOU:   Understand these instructions.  Will watch your condition.  Will get help right away if you are not doing well or get worse.   This information is not intended to replace advice given to you by your health care provider. Make sure you discuss any questions you have with your health care provider.   Document Released: 01/01/2004 Document Revised: 01/29/2013 Document Reviewed: 10/15/2012 Elsevier Interactive Patient Education 2016 [employer].   You should take the miralax, 2 caps twice daily.   You should also take the Sorbitol every four hours until you begin to have regular bowel movements.   If this does not work, then you can use GoLitely over  the counter to help improve your constipation.   If you develop worsening abdominal pain, nausea, or vomiting, then please return for further evaluation and management.   Thanks for letting us take care of you.   Sincerely,  Devota Pace, MD

## 2015-03-20 NOTE — Progress Notes (Deleted)
Manual disimpaction done by Dr Cathlean CowerMikell.

## 2015-03-22 ENCOUNTER — Other Ambulatory Visit: Payer: Medicaid Other

## 2015-03-23 ENCOUNTER — Ambulatory Visit (HOSPITAL_COMMUNITY)
Admission: RE | Admit: 2015-03-23 | Discharge: 2015-03-23 | Disposition: A | Discharge status: Home / Self Care | Payer: Medicaid Other | Source: Ambulatory Visit | Attending: Maternal and Fetal Medicine | Admitting: Maternal and Fetal Medicine

## 2015-03-23 ENCOUNTER — Ambulatory Visit (HOSPITAL_COMMUNITY): Payer: Medicaid Other

## 2015-03-23 VITALS — BP 129/76 | HR 96 | Wt 242.2 lb

## 2015-03-23 DIAGNOSIS — O4413 Placenta previa with hemorrhage, third trimester: Secondary | ICD-10-CM | POA: Diagnosis not present

## 2015-03-23 DIAGNOSIS — O0993 Supervision of high risk pregnancy, unspecified, third trimester: Secondary | ICD-10-CM

## 2015-03-23 DIAGNOSIS — O4403 Placenta previa specified as without hemorrhage, third trimester: Secondary | ICD-10-CM

## 2015-03-23 DIAGNOSIS — O24113 Pre-existing diabetes mellitus, type 2, in pregnancy, third trimester: Secondary | ICD-10-CM | POA: Diagnosis not present

## 2015-03-25 ENCOUNTER — Inpatient Hospital Stay (HOSPITAL_COMMUNITY)
Admission: AD | Admit: 2015-03-25 | Discharge: 2015-03-25 | Disposition: A | Discharge status: Home / Self Care | Payer: Medicaid Other | Source: Ambulatory Visit | Attending: Family Medicine | Admitting: Family Medicine

## 2015-03-25 ENCOUNTER — Encounter (HOSPITAL_COMMUNITY): Payer: Self-pay

## 2015-03-25 ENCOUNTER — Other Ambulatory Visit: Payer: Medicaid Other

## 2015-03-25 DIAGNOSIS — Z3A33 33 weeks gestation of pregnancy: Secondary | ICD-10-CM | POA: Diagnosis not present

## 2015-03-25 DIAGNOSIS — O26893 Other specified pregnancy related conditions, third trimester: Secondary | ICD-10-CM | POA: Insufficient documentation

## 2015-03-25 DIAGNOSIS — K5900 Constipation, unspecified: Secondary | ICD-10-CM | POA: Insufficient documentation

## 2015-03-25 DIAGNOSIS — O99613 Diseases of the digestive system complicating pregnancy, third trimester: Secondary | ICD-10-CM

## 2015-03-25 NOTE — Discharge Instructions (Signed)
Estreimiento - Adultos (Constipation, Adult) Estreimiento significa que una persona tiene menos de tres evacuaciones en una semana, dificultad para defecar, o que las heces son secas, duras, o ms grandes que lo normal. A medida que envejecemos el estreimiento es ms comn. Una dieta baja en fibra, no tomar suficientes lquidos y el uso de ciertos medicamentos pueden empeorar el estreimiento.  CAUSAS   Ciertos medicamentos, como los antidepresivos, analgsicos, suplementos de hierro, anticidos y diurticos.  Algunas enfermedades, como la diabetes, el sndrome del colon irritable, enfermedad de la tiroides, o depresin.  No beber suficiente agua.  No consumir suficientes alimentos ricos en fibra.  Situaciones de estrs o viajes.  Falta de actividad fsica o de ejercicio.  Ignorar la necesidad sbita de defecar.  Uso en exceso de laxantes. SIGNOS Y SNTOMAS   Defecar menos de tres veces por semana.  Dificultad para defecar.  Tener las heces secas y duras, o ms grandes que las normales.  Sensacin de estar lleno o hinchado.  Dolor en la parte baja del abdomen.  No sentir alivio despus de defecar. DIAGNSTICO  El mdico le har una historia clnica y un examen fsico. Pueden hacerle exmenes adicionales para el estreimiento grave. Estos estudios pueden ser:  Un radiografa con enema de bario para examinar el recto, el colon y, en algunos casos, el intestino delgado.  Una sigmoidoscopia para examinar el colon inferior.  Una colonoscopia para examinar todo el colon. TRATAMIENTO  El tratamiento depender de la gravedad del estreimiento y de la causa. Algunos tratamientos nutricionales son beber ms lquidos y comer ms alimentos ricos en fibra. El cambio en el estilo de vida incluye hacer ejercicios de manera regular. Si estas recomendaciones para realizar cambios en la dieta y en el estilo de vida no ayudan, el mdico le puede indicar el uso de laxantes de venta libre  para ayudarlo a defecar. Los medicamentos recetados se pueden prescribir si los medicamentos de venta libre no lo ayudan.  INSTRUCCIONES PARA EL CUIDADO EN EL HOGAR   Consuma alimentos con alto contenido de fibra, como frutas, vegetales, cereales integrales y porotos.  Limite los alimentos procesados ricos en grasas y azcar, como las papas fritas, hamburguesas, galletas, dulces y refrescos.  Puede agregar un suplemento de fibra a su dieta si no obtiene lo suficiente de los alimentos.  Beba suficiente lquido para mantener la orina clara o de color amarillo plido.  Haga ejercicio regularmente o segn las indicaciones del mdico.  Vaya al bao cuando sienta la necesidad de ir. No se aguante las ganas.  Tome solo medicamentos de venta libre o recetados, segn las indicaciones del mdico. No tome otros medicamentos para el estreimiento sin consultarlo antes con su mdico. SOLICITE ATENCIN MDICA DE INMEDIATO SI:   Observa sangre brillante en las heces.  El estreimiento dura ms de 4 das o empeora.  Siente dolor abdominal o rectal.  Las heces son delgadas como un lpiz.  Pierde peso de manera inexplicable. ASEGRESE DE QUE:   Comprende estas instrucciones.  Controlar su afeccin.  Recibir ayuda de inmediato si no mejora o si empeora.   Esta informacin no tiene como fin reemplazar el consejo del mdico. Asegrese de hacerle al mdico cualquier pregunta que tenga.   Document Released: 04/30/2007 Document Revised: 05/01/2014 Elsevier Interactive Patient Education 2016 Elsevier Inc.  

## 2015-03-25 NOTE — MAU Provider Note (Signed)
History     CSN: [id number]  Arrival date and time: 03/25/15 1622   None     Chief Complaint  Patient presents with  . Constipation   HPI   Ms.Lisa Moreno is a 39 y.o. female G2P1001 at 28w5dwith a history of ACCRETA; [facility] to [facility] with constipation. This is the patient's second visit to MAU in 5 days for constipation. The patient is currently using narcotic pain medication due to her ACCRETA, this has caused severe constipation.   The patient was last here on 11/26 with the same complaint and was disimpacted here in MAU with success. She was sent home with magnesium citrate to use or sorbitol which was called into her pharmacy. She was also instructed to take miralax and stool softener as she has been taking. She took the the Magnesium citrate on Monday as directed and had a soft, moderate sized BM on Tuesday. [facility] and did not want to take another dose of Magnesium Citrate yesterday as it was recommended, because she did not want to go to the bathroom on herself on the way to her appointment. She mentioned to her Dr. Today that she has been constipated and they recommended she come and get "cleaned out" since she will be having a cesarean section soon.   She denies vaginal bleeding She denies leaking of fluid + fetal movement.  She denies pain.   OB History    Gravida Para Term Preterm AB TAB SAB Ectopic Multiple Living   2 1 1  0 0 0 0 0 0 1      Past Medical History  Diagnosis Date  . Asthma   . Hypertension   . Diabetes mellitus without complication (HUrie   . GERD (gastroesophageal reflux disease)   . Gestational diabetes     Past Surgical History  Procedure Laterality Date  . Breast reduction surgery    . Tonsillectomy      Family History  Problem Relation Age of Onset  . Diabetes Mother   . Stroke Mother   . Hypertension Mother   . Depression Mother   .  Diabetes Father   . Stroke Father   . Kidney disease Father     Social History  Substance Use Topics  . Smoking status: Never Smoker   . Smokeless tobacco: Never Used  . Alcohol Use: No    Allergies:  Allergies  Allergen Reactions  . Decadron [Dexamethasone] Other (See Comments)    Intense burning vaginal/rectal area.     Prescriptions prior to admission  Medication Sig Dispense Refill Last Dose  . ACCU-CHEK FASTCLIX LANCETS MISC 1 Units by Percutaneous route 4 (four) times daily. 100 each 12 Taking  . albuterol (PROVENTIL HFA;VENTOLIN HFA) 108 (90 BASE) MCG/ACT inhaler Inhale 2 puffs into the lungs every 6 (six) hours as needed for wheezing. 1 Inhaler 11 Past Month at Unknown time  . albuterol (PROVENTIL) (5 MG/ML) 0.5% nebulizer solution Take 2.5 mg by nebulization every 6 (six) hours as needed for wheezing or shortness of breath.   rescue  . Blood Glucose Monitoring Suppl (ACCU-CHEK NANO SMARTVIEW) W/DEVICE KIT 1 Device by Does not apply route 4 (four) times daily. 1 kit 0 Taking  . gabapentin (NEURONTIN) 300 MG capsule Take 1 capsule (300 mg total) by mouth at bedtime. 30 capsule 11 03/19/2015 at Unknown time  . glucose blood test strip Use as instructed  to check blood glucose 4 times daily. 100 each 12 Taking  . glucose blood test strip Use as instructed 100 each 12 Taking  . glyBURIDE (DIABETA) 2.5 MG tablet Take 1 1/2 tablet in the morning 45 tablet 3 03/20/2015 at Unknown time  . magnesium citrate SOLN Take 296 mLs (1 Bottle total) by mouth once. 195 mL 0   . metFORMIN (GLUCOPHAGE) 1000 MG tablet Take 1 tablet (1,000 mg total) by mouth 2 (two) times daily with a meal. 30 tablet 3 03/20/2015 at Unknown time  . nystatin cream (MYCOSTATIN) Apply 1 application topically 2 (two) times daily. (Patient taking differently: Apply 1 application topically 2 (two) times daily as needed. ) 30 g 1 Past Month at Unknown time  . OVER THE COUNTER MEDICATION 1 capsule daily. Over the counter  stool softner with laxative.   03/20/2015 at Unknown time  . OVER THE COUNTER MEDICATION 1 tablet daily as needed. Over the counter sleep medicine.   Past Week at Unknown time  . oxyCODONE-acetaminophen (PERCOCET/ROXICET) 5-325 MG tablet Take 1-2 tablets by mouth every 6 (six) hours as needed for severe pain. 15 tablet 0 03/20/2015 at Unknown time  . polyethylene glycol powder (GLYCOLAX/MIRALAX) powder Take 17-34 g by mouth daily as needed for moderate constipation.   03/20/2015 at Unknown time  . ranitidine (ZANTAC) 150 MG tablet Take 1 tablet (150 mg total) by mouth 2 (two) times daily. 60 tablet 2 03/20/2015 at Unknown time  . sorbitol 70 % solution Take 15 mLs by mouth every 4 (four) hours as needed (Until you have regular bowel movements.). 473 mL 0    No results found for this or any previous visit (from the past 48 hour(s)).  Review of Systems  Constitutional: Negative for fever, chills and diaphoresis.  Gastrointestinal: Positive for constipation and blood in stool. Negative for nausea, vomiting, abdominal pain and diarrhea.   Physical Exam   Blood pressure 123/75, pulse 88, temperature 98.4 F (36.9 C), resp. rate 18, last menstrual period 08/05/2014.  Physical Exam  Constitutional: She is oriented to person, place, and time. She appears well-developed and well-nourished. No distress.  HENT:  Head: Normocephalic.  Eyes: Pupils are equal, round, and reactive to light.  Respiratory: Effort normal.  GI: Soft. Normal appearance and bowel sounds are normal. She exhibits no mass. There is no tenderness. There is no rigidity, no rebound and no guarding.  Genitourinary: Rectal exam shows internal hemorrhoid and tenderness. Rectal exam shows no fissure and no mass.  Small amount of soft stool felt posterior in the rectum. No blood noted.   Musculoskeletal: Normal range of motion.  Neurological: She is alert and oriented to person, place, and time.  Skin: Skin is warm. She is not  diaphoretic.  Psychiatric: Her behavior is normal.   Fetal Tracing: Baseline: 125 bpm  Variability: moderate  Accelerations: 15x15 Decelerations: none Toco: quiet   MAU Course  Procedures  None  MDM   Assessment and Plan   A:  1. Constipation in pregnancy, third trimester    P:  Discharge home in stable condition Continue Magnesium citrate or Sorbitol as prescribed Continue miralax and stool softeners Return to MAU with any fever, nausea/ vomiting.  Follow up with OB as as scheduled Fetal kick counts   Limited narcotic pain medication.    Lezlie Lye, NP 03/25/2015 5:31 PM

## 2015-03-25 NOTE — MAU Note (Signed)
Pt presents to MAU with complaints of constipation. Pt was evaluated here on Saturday the 27th for constipation and pt had to be disimpacted by provider soap suds enema unsuccessful/

## 2015-03-29 ENCOUNTER — Ambulatory Visit: Payer: Medicaid Other | Admitting: Cardiology

## 2015-03-29 ENCOUNTER — Other Ambulatory Visit: Payer: Medicaid Other

## 2015-03-30 ENCOUNTER — Other Ambulatory Visit (HOSPITAL_COMMUNITY): Payer: Self-pay | Admitting: Maternal and Fetal Medicine

## 2015-03-30 ENCOUNTER — Ambulatory Visit (HOSPITAL_COMMUNITY)
Admission: RE | Admit: 2015-03-30 | Discharge: 2015-03-30 | Disposition: A | Discharge status: Home / Self Care | Payer: Medicaid Other | Source: Ambulatory Visit | Attending: Obstetrics and Gynecology | Admitting: Obstetrics and Gynecology

## 2015-03-30 ENCOUNTER — Encounter (HOSPITAL_COMMUNITY): Payer: Self-pay

## 2015-03-30 DIAGNOSIS — Z3A34 34 weeks gestation of pregnancy: Secondary | ICD-10-CM | POA: Diagnosis not present

## 2015-03-30 DIAGNOSIS — O09523 Supervision of elderly multigravida, third trimester: Secondary | ICD-10-CM | POA: Diagnosis not present

## 2015-03-30 DIAGNOSIS — O10013 Pre-existing essential hypertension complicating pregnancy, third trimester: Secondary | ICD-10-CM | POA: Diagnosis not present

## 2015-03-30 DIAGNOSIS — O99213 Obesity complicating pregnancy, third trimester: Secondary | ICD-10-CM

## 2015-03-30 DIAGNOSIS — O10919 Unspecified pre-existing hypertension complicating pregnancy, unspecified trimester: Secondary | ICD-10-CM

## 2015-03-30 DIAGNOSIS — O24313 Unspecified pre-existing diabetes mellitus in pregnancy, third trimester: Secondary | ICD-10-CM | POA: Insufficient documentation

## 2015-03-30 DIAGNOSIS — O4403 Placenta previa specified as without hemorrhage, third trimester: Secondary | ICD-10-CM

## 2015-03-30 DIAGNOSIS — O24113 Pre-existing diabetes mellitus, type 2, in pregnancy, third trimester: Secondary | ICD-10-CM

## 2015-03-30 DIAGNOSIS — O358XX0 Maternal care for other (suspected) fetal abnormality and damage, not applicable or unspecified: Secondary | ICD-10-CM | POA: Insufficient documentation

## 2015-04-01 ENCOUNTER — Other Ambulatory Visit: Payer: Medicaid Other

## 2015-04-19 ENCOUNTER — Emergency Department (HOSPITAL_COMMUNITY)
Admission: EM | Admit: 2015-04-19 | Discharge: 2015-04-19 | Disposition: A | Discharge status: Home / Self Care | Payer: Medicaid Other | Attending: Emergency Medicine | Admitting: Emergency Medicine

## 2015-04-19 ENCOUNTER — Emergency Department (HOSPITAL_COMMUNITY): Payer: Medicaid Other

## 2015-04-19 ENCOUNTER — Encounter (HOSPITAL_COMMUNITY): Payer: Self-pay | Admitting: *Deleted

## 2015-04-19 DIAGNOSIS — Z4801 Encounter for change or removal of surgical wound dressing: Secondary | ICD-10-CM | POA: Insufficient documentation

## 2015-04-19 DIAGNOSIS — Z79899 Other long term (current) drug therapy: Secondary | ICD-10-CM | POA: Diagnosis not present

## 2015-04-19 DIAGNOSIS — I10 Essential (primary) hypertension: Secondary | ICD-10-CM | POA: Insufficient documentation

## 2015-04-19 DIAGNOSIS — Z8632 Personal history of gestational diabetes: Secondary | ICD-10-CM | POA: Diagnosis not present

## 2015-04-19 DIAGNOSIS — K219 Gastro-esophageal reflux disease without esophagitis: Secondary | ICD-10-CM | POA: Diagnosis not present

## 2015-04-19 DIAGNOSIS — J45901 Unspecified asthma with (acute) exacerbation: Secondary | ICD-10-CM | POA: Diagnosis not present

## 2015-04-19 DIAGNOSIS — E119 Type 2 diabetes mellitus without complications: Secondary | ICD-10-CM | POA: Insufficient documentation

## 2015-04-19 DIAGNOSIS — R05 Cough: Secondary | ICD-10-CM | POA: Diagnosis present

## 2015-04-19 DIAGNOSIS — R109 Unspecified abdominal pain: Secondary | ICD-10-CM | POA: Insufficient documentation

## 2015-04-19 DIAGNOSIS — Z7984 Long term (current) use of oral hypoglycemic drugs: Secondary | ICD-10-CM | POA: Insufficient documentation

## 2015-04-19 DIAGNOSIS — Z5189 Encounter for other specified aftercare: Secondary | ICD-10-CM

## 2015-04-19 LAB — COMPREHENSIVE METABOLIC PANEL
ALBUMIN: 3 g/dL — AB (ref 3.5–5.0)
ALT: 10 U/L — AB (ref 14–54)
AST: 14 U/L — AB (ref 15–41)
Alkaline Phosphatase: 92 U/L (ref 38–126)
Anion gap: 11 (ref 5–15)
BUN: 9 mg/dL (ref 6–20)
CHLORIDE: 110 mmol/L (ref 101–111)
CO2: 22 mmol/L (ref 22–32)
CREATININE: 0.63 mg/dL (ref 0.44–1.00)
Calcium: 9 mg/dL (ref 8.9–10.3)
GFR calc Af Amer: >60 mL/min (ref >60)
GLUCOSE: 157 mg/dL — AB (ref 65–99)
Potassium: 3.7 mmol/L (ref 3.5–5.1)
Sodium: 143 mmol/L (ref 135–145)
Total Bilirubin: 0.3 mg/dL (ref 0.3–1.2)
Total Protein: 6.3 g/dL — ABNORMAL LOW (ref 6.5–8.1)

## 2015-04-19 LAB — CBC
HEMATOCRIT: 28.1 % — AB (ref 36.0–46.0)
Hemoglobin: 8.6 g/dL — ABNORMAL LOW (ref 12.0–15.0)
MCH: 24.3 pg — AB (ref 26.0–34.0)
MCHC: 30.6 g/dL (ref 30.0–36.0)
MCV: 79.4 fL (ref 78.0–100.0)
PLATELETS: 428 10*3/uL — AB (ref 150–400)
RBC: 3.54 MIL/uL — ABNORMAL LOW (ref 3.87–5.11)
RDW: 15.8 % — AB (ref 11.5–15.5)
WBC: 13.2 10*3/uL — AB (ref 4.0–10.5)

## 2015-04-19 LAB — LIPASE, BLOOD: LIPASE: 41 U/L (ref 11–51)

## 2015-04-19 MED ORDER — ALBUTEROL SULFATE (2.5 MG/3ML) 0.083% IN NEBU
2.5000 mg | INHALATION_SOLUTION | Freq: Once | RESPIRATORY_TRACT | Status: AC
  Administered 2015-04-19: 2.5 mg via RESPIRATORY_TRACT
  Filled 2015-04-19: qty 3

## 2015-04-19 MED ORDER — PREDNISONE 20 MG PO TABS
50.0000 mg | ORAL_TABLET | Freq: Once | ORAL | Status: AC
  Administered 2015-04-19: 50 mg via ORAL
  Filled 2015-04-19: qty 3

## 2015-04-19 MED ORDER — PREDNISONE 50 MG PO TABS: ORAL_TABLET | ORAL | Status: DC

## 2015-04-19 NOTE — Progress Notes (Addendum)
CM called and spoke with pt @ [phone] on 04/19/15 @ 2227. Patient said that she needed help getting an albuterol nebulizer filled but was not given a RX. Patient said that she was told that she needed to go to her PCP for that and she was discharged with albuterol inhaler. Patient said that she does not have a PCP and only has "pregnancy medicaid". Patient will see OB GYN tomorrow and will ask about the nebulizer solution. [facility] and CM provided contact information. Patient said that she has the nebulizer machine and just needs the solution so will ask Ob gyn. Cm advised that [facility] CM advised for patient to call [facility] in the morning to get established with PCP re: follow up and nebulizer as her medicaid soon runs out. Patient said that she was able to get all of her other medications filled and will follow up in the morning. No further CM needs communicated at this time.

## 2015-04-19 NOTE — ED Notes (Signed)
Spoke with care management about calling pt.

## 2015-04-19 NOTE — ED Notes (Signed)
Pt states that she has had a cough for several days. Pt states that she has been seen for this and given an inhaler and antibiotics but it is not improving. Pt states that she had a c section on the 13th. Pt states that since she has been coughing she has noticed her incision has opened in 3 areas. Pt states that she is also spotting now which she was not doing.

## 2015-04-19 NOTE — Discharge Instructions (Signed)
Asma - Adultos (Asthma, Adult) El asma es una enfermedad recurrente en la que las vas respiratorias se estrechan y [place]. Puede causar dificultad para respirar. Provoca tos, sibilancias y sensacin de falta de aire. Los episodios de asma, tambin llamados crisis de asma, pueden ser leves o potencialmente mortales. El asma no puede curarse, pero los [employer] y los cambios en el estilo de vida lo ayudarn a Aeronautical engineer enfermedad. CAUSAS Se cree que la causa del asma son factores hereditarios (genticos) y la exposicin a factores ambientales; sin embargo, su causa exacta se desconoce. El asma generalmente es desencadenada por alrgenos, infecciones en los pulmones o sustancias irritantes que se encuentran en el aire. Los desencadenantes del asma son diferentes para cada persona. Los factores desencadenantes comunes incluyen:   Caspa de los [place].  caros del polvo.  [place].  El polen de los rboles o el csped.  Moho.  Humo.  Sustancias contaminantes como el polvo, limpiadores del hogar, sprays para el cabello, aerosoles, vapores de pintura, sustancias qumicas fuertes u olores intensos.  El [place] fro, los cambios de Scientist, forensic y el viento (que aumenta la cantidad de moho y polen en el aire).  Emociones intensas, como llorar o rer [employer].  Estrs.  Ciertos medicamentos (como la aspirina) o algunos frmacos (como los betabloqueantes).  Los sulfitos que contienen los alimentos y las bebidas. Los alimentos y bebidas que pueden contener sulfitos son las frutas desecadas, las papas fritas y los vinos espumantes.  Enfermedades infecciosas o inflamatorias, como la gripe, el resfro o la inflamacin de las membranas nasales (rinitis).  El reflujo gastroesofgico (ERGE).  Los ejercicios o actividades extenuantes. SNTOMAS Los sntomas pueden ocurrir inmediatamente despus de que se desencadena el asma o muchas horas ms tarde. [place] sntomas  son:  Sibilancias.  Tos excesiva durante la noche o temprano por la maana.  Tos frecuente o intensa durante un resfro comn.  Opresin en el pecho.  Falta de aire. DIAGNSTICO  El diagnstico se realiza mediante la revisin de su historia clnica y de un examen fsico. Es posible que le indiquen algunos estudios. Estos pueden incluir:  Estudios de la funcin pulmonar. Estas pruebas indican cunto aire inhala y exhala.  Pruebas de alergia.  Estudios de diagnstico por imgenes, como radiografas. TRATAMIENTO  El asma no puede curarse, pero puede controlarse. El Tax inspector identificar y Product/process development scientist los factores desencadenantes del asma. Tambin incluye medicamentos. Hay dos tipos de medicamentos utilizados en el tratamiento para el asma:   Medicamentos de control del asma. Impiden que aparezcan los sntomas. Generalmente se [employer].  Medicamentos de [place] o de rescate. Alivian los sntomas rpidamente. Se utilizan cuando es necesario y proporcionan alivio a Control and instrumentation engineer. El mdico lo ayudar a Animal nutritionist de accin para el asma, que es una planificacin por escrito para el control y el tratamiento de las crisis de asma. Incluye una lista de los factores desencadenantes y el modo en que pueden evitarse. Tambin incluye informacin acerca del momento en que se deben Apple [employer] y cundo se debe cambiar la dosis. Un plan de accin tambin incluye el uso de un dispositivo llamado espirmetro. El espirmetro es un dispositivo que mide el funcionamiento de los pulmones. Lo ayuda a controlar la enfermedad. [place] los medicamentos solamente como se lo haya indicado el mdico. Hable con el mdico si tiene preguntas acerca de cmo o cundo tomar los medicamentos.  Use un espirmetro de acuerdo  con las indicaciones del mdico. Anote y lleve un registro de los [place] Rapidsvalores.  [place] y Goodrich Corporationutilice el plan de accin para ayudar  a minimizar o detener una crisis de asma sin necesidad de buscar atencin mdica.  Controle el ambiente de su hogar de la siguiente manera para prevenir las crisis de asma:  No fume. Evite la exposicin al humo de otros fumadores.  Cambie regularmente el filtro de la calefaccin y del aire acondicionado.  Limite el uso de hogares o estufas a lea.  Elimine las plagas (como cucarachas, ratones) y sus excrementos.  Elimine las plantas si observa moho en ellas.  Limpie habitualmente los pisos y el polvo. Utilice productos sin perfume.  Intente que otra persona pase la aspiradora con regularidad. Permanezca fuera de las habitaciones mientras son aspiradas y por algn tiempo despus. Si usted pasa la Jaclyn Primeaspiradora, use una mscara para polvo de las que se consiguen en la [place], una bolsa de aspiradora de doble capa o microfiltro o una aspiradora con un filtro HEPA.  Reemplace las alfombras por pisos de [place]adera, baldosas o vinilo. Las alfombras pueden retener la caspa de los animales y [place] polvo.  Use almohadas, mantas y cubre colchones antialrgicos.  Lave las sbanas y las mantas todas las semanas con agua caliente y squelas con aire caliente.  Use mantas de polister o algodn.  Limpie baos y cocinas con lavandina. Si fuera posible, pdale a alguien que vuelva a pintar las paredes de estas habitaciones con Neomia Dearuna pintura resistente a los hongos. Aljese de las habitaciones que se estn limpiando y pintando.  Lvese las manos con frecuencia. SOLICITE ATENCIN MDICA SI:   Respira con dificultad an cuando toma el medicamento para prevenir las crisis.  La mucosidad coloreada que expectora cuando tose (esputo) es ms espesa que lo habitual.  Su esputo cambia de un color claro o blanco a un color amarillo, verde, gris o sanguinolento.  Tiene trastornos ocasionados por el medicamento que est tomando (como urticaria, picazn, hinchazn o dificultades respiratorias).  Necesita un  medicamento aliviador ms de 2 o 3 veces por semana.  Su flujo espiratorio mximo se mantiene entre el 50% y el 79% de su Arts administratormejor valor personal, despus de seguir el plan de accin durante 1hora.  Tiene fiebre. SOLICITE ATENCIN MDICA DE INMEDIATO SI:   Usted parece empeorar y no responde al tratamiento durante una crisis de asma.  Le falta el aire, incluso en reposo.  Le falta el aire an cuando hace muy poca actividad fsica.  Tiene dificultad para comer, beber o hablar debido a los sntomas del asma.  Siente dolor en el pecho.  Se le acelera la frecuencia cardaca.  Tiene los labios o las uas de tono Taylorazulado.  Siente que est por desvanecerse, est mareado o se desmaya.  Su flujo mximo es Adult nursemenor del 50% del Arts administratormejor valor personal.   Esta informacin no tiene Theme park managercomo fin reemplazar el consejo del mdico. Asegrese de hacerle al mdico cualquier pregunta que tenga.   Document Released: 04/10/2005 Document Revised: 12/30/2014 Elsevier Interactive Patient Education 2016 [employer]  Cuidado de la incisin  (Incision Care)    La incisin es el corte que el cirujano realiza en el cuerpo. Despus de la [place], es necesario brindarle el cuidado necesario para evitar que se infecte.  CMO CUIDAR DE LA INCISIN  Tome los medicamentos solamente como se lo haya indicado el mdico.  Hay muchas maneras distintas de cerrar y Leonia Reevescubrir una incisin, como puntos, pegamento para la piel  y [place]. Siga todas las indicaciones del mdico respecto a lo siguiente:  Cuidar de la incisin.  Cambiar y Advertising account planner el vendaje.  Quitar el cierre de la incisin. No tome baos de inmersin, no nade ni use el jacuzzi hasta que el mdico lo autorice. Puede ducharse como se lo haya indicado el mdico.  Reanude su dieta y sus actividades habituales como se lo hayan indicado.  Use un medicamento para la picazn (por ejemplo, un antihistamnico) como se lo haya indicado el mdico. La incisin puede  picar mientras cicatriza. No se toque ni se rasque la incisin.  Beba suficiente lquido para Photographer orina clara o de color amarillo plido. SOLICITE ATENCIN MDICA SI:  Hay secrecin, enrojecimiento, hinchazn o dolor en el lugar de la incisin.  Siente dolores musculares, escalofros o una sensacin general de Dentist.  Advierte un olor ftido que proviene de la incisin o del vendaje.  Los bordes de la incisin se separan despus de que le hayan quitado las suturas, las grapas o las tiras [place] para la piel.  Tiene nuseas o vmitos persistentes.  Tiene fiebre.  Tiene mareos. SOLICITE ATENCIN MDICA DE INMEDIATO SI:  Tiene una erupcin cutnea.  Se desmaya.  Tiene dificultad para respirar. ASEGRESE DE QUE:  Comprende estas instrucciones.  Controlar su afeccin.  Recibir ayuda de inmediato si no mejora o si empeora. Esta informacin no tiene Theme park manager el consejo del mdico. Asegrese de hacerle al mdico cualquier pregunta que tenga.  Document Released: 02/04/2009 Document Revised: 08/25/2014  Elsevier Interactive Patient Education [employer]

## 2015-04-19 NOTE — ED Provider Notes (Signed)
CSN: [id number]     Arrival date & time 04/19/15  1152 History   First MD Initiated Contact with Patient 04/19/15 1424     Chief Complaint  Patient presents with  . Cough  . Abdominal Pain   HPI  Lisa Moreno is a 39 year old female with PMHx of asthma, HTN, DM and GERD presenting with cough and abdominal pain. Patient had a C-section on December 13 and reports developing a cough 3 days later. She has had a persistent cough with increased wheezing since then. She reports every time she coughs her abdominal pain over her C-section wound increases. She states that it feels extremely sore. She is concerned because she feels that her C-section wound is beginning to open. Denies erythema of the skin surrounding the wound or purulent drainage. She was evaluated by an emergency department in [facility] for this complaint. They diagnosed her with bronchitis and discharged her with an albuterol inhaler and a Z-Pak. She reports that her cough has been persistent since and has not improved with the medications. Denies fevers, chills, headaches, dizziness, syncope, chest pain, shortness of breath, nausea or vomiting. She has an appointment with her OB/GYN tomorrow. She is currently breast-feeding and supplementing with formula.  Past Medical History  Diagnosis Date  . Asthma   . Hypertension   . Diabetes mellitus without complication ([place])   . GERD (gastroesophageal reflux disease)   . Gestational diabetes    Past Surgical History  Procedure Laterality Date  . Breast reduction surgery    . Tonsillectomy    . Abdominal hysterectomy     Family History  Problem Relation Age of Onset  . Diabetes Mother   . Stroke Mother   . Hypertension Mother   . Depression Mother   . Diabetes Father   . Stroke Father   . Kidney disease Father    Social History  Substance Use Topics  . Smoking status: Never Smoker   . Smokeless tobacco: Never Used  . Alcohol Use: No   OB History    Gravida  Para Term Preterm AB TAB SAB Ectopic Multiple Living   3 2 2  0 0 0 0 0 0 2     Review of Systems  Constitutional: Negative for fever and chills.  Respiratory: Positive for cough and wheezing. Negative for shortness of breath.   Cardiovascular: Negative for chest pain.  Gastrointestinal: Positive for abdominal pain. Negative for nausea and vomiting.  All other systems reviewed and are negative.     Allergies  Decadron  Home Medications   Prior to Admission medications   Medication Sig Start Date End Date Taking? Authorizing Provider  ACCU-CHEK FASTCLIX LANCETS MISC 1 Units by Percutaneous route 4 (four) times daily. 02/18/15  Yes Osborne Oman, MD  albuterol (PROVENTIL HFA;VENTOLIN HFA) 108 (90 BASE) MCG/ACT inhaler Inhale 2 puffs into the lungs every 6 (six) hours as needed for wheezing. 12/07/14  Yes Tanna Savoy Stinson, DO  azithromycin (ZITHROMAX Z-PAK) 250 MG tablet Take 250-500 mg by mouth taper from 4 doses each day to 1 dose and stop. 04/14/15  Yes Historical Provider, MD  Blood Glucose Monitoring Suppl (ACCU-CHEK NANO SMARTVIEW) W/DEVICE KIT 1 Device by Does not apply route 4 (four) times daily. 02/15/15  Yes Peggy Constant, MD  ferrous sulfate 325 (65 FE) MG EC tablet Take 325 mg by mouth 3 (three) times daily. 04/09/15 07/08/15 Yes Historical Provider, MD  gabapentin (NEURONTIN) 300 MG capsule Take 1 capsule (300  mg total) by mouth at bedtime. 01/18/15  Yes Caren Macadam, MD  glucose blood test strip Use as instructed to check blood glucose 4 times daily. 12/09/14  Yes Donnamae Jude, MD  glyBURIDE (DIABETA) 2.5 MG tablet Take 1 1/2 tablet in the morning 03/01/15  Yes Caren Macadam, MD  ibuprofen (ADVIL) 200 MG tablet Take 800 mg by mouth 3 (three) times daily as needed for moderate pain.  04/09/15 05/09/15 Yes Historical Provider, MD  metFORMIN (GLUCOPHAGE) 1000 MG tablet Take 1 tablet (1,000 mg total) by mouth 2 (two) times daily with a meal. 03/01/15  Yes Caren Macadam, MD  oxyCODONE-acetaminophen (PERCOCET/ROXICET) 5-325 MG tablet Take 1-2 tablets by mouth every 6 (six) hours as needed for severe pain. 03/15/15  Yes Peggy Constant, MD  phenylephrine-shark liver oil-mineral oil-petrolatum (PREPARATION H) 0.25-3-14-71.9 % rectal ointment Place 1 application rectally 4 (four) times daily as needed for hemorrhoids.   Yes Historical Provider, MD  polyethylene glycol powder (GLYCOLAX/MIRALAX) powder Take 17-34 g by mouth daily as needed for moderate constipation.   Yes Historical Provider, MD  ranitidine (ZANTAC) 150 MG tablet Take 1 tablet (150 mg total) by mouth 2 (two) times daily. 11/12/14  Yes Lisa A Leftwich-Kirby, CNM  sorbitol 70 % solution Take 15 mLs by mouth every 4 (four) hours as needed (Until you have regular bowel movements.). 03/20/15  Yes York Ram Melancon, MD  glucose blood test strip Use as instructed Patient not taking: Reported on 04/19/2015 02/18/15   Osborne Oman, MD  magnesium citrate SOLN Take 296 mLs (1 Bottle total) by mouth once. Patient not taking: Reported on 04/19/2015 03/20/15   Aquilla Hacker, MD  predniSONE (DELTASONE) 50 MG tablet Take 1 tablet a day for 5 days 04/19/15   Ethelyn Cerniglia, PA-C   BP 128/78 mmHg  Pulse 77  Temp(Src) 98.6 F (37 C) (Oral)  Resp 15  Ht 5' 4"  (1.626 m)  Wt 107.049 kg  BMI 40.49 kg/m2  SpO2 100%  LMP 08/05/2014  Breastfeeding? Unknown Physical Exam  Constitutional: She appears well-developed and well-nourished. No distress.  Nontoxic-appearing  HENT:  Head: Normocephalic and atraumatic.  Mouth/Throat: Oropharynx is clear and moist.  Eyes: Conjunctivae are normal. Right eye exhibits no discharge. Left eye exhibits no discharge. No scleral icterus.  Neck: Normal range of motion. Neck supple.  Cardiovascular: Normal rate, regular rhythm and normal heart sounds.   Pulmonary/Chest: Effort normal. No respiratory distress. She has wheezes.  Breathing unlabored. Diffuse wheezing in  the lower lung fields.  Abdominal: Soft. There is tenderness. There is no rebound and no guarding.  Well-healing vertical C-section scar over the abdomen. Small amount of dehiscence at the inferior aspect of the scar. No purulent drainage or erythema surrounding the wound. Firm hematoma felt over the superior aspect of the scar which was noted in the emergency department visit 5 days ago. Remaining abdomen is soft, nontender. No peritoneal signs.  Musculoskeletal: Normal range of motion.  Neurological: She is alert. Coordination normal.  Skin: Skin is warm and dry.  Psychiatric: She has a normal mood and affect. Her behavior is normal.  Nursing note and vitals reviewed.   ED Course  Procedures (including critical care time) Labs Review Labs Reviewed  COMPREHENSIVE METABOLIC PANEL - Abnormal; Notable for the following:    Glucose, Bld 157 (*)    Total Protein 6.3 (*)    Albumin 3.0 (*)    AST 14 (*)    ALT 10 (*)  All other components within normal limits  CBC - Abnormal; Notable for the following:    WBC 13.2 (*)    RBC 3.54 (*)    Hemoglobin 8.6 (*)    HCT 28.1 (*)    MCH 24.3 (*)    RDW 15.8 (*)    Platelets 428 (*)    All other components within normal limits  LIPASE, BLOOD    Imaging Review Dg Chest 2 View  04/19/2015  CLINICAL DATA:  Cough for server toes. No improvement with inhalers for antibiotics. Recent delivery 12/13 EXAM: CHEST - 2 VIEW COMPARISON:  Two-view chest x-ray 09/18/2012. FINDINGS: The heart size is normal. Minimal bibasilar airspace opacity likely reflects atelectasis. No other focal airspace disease is present. There is no edema or effusion. The visualized soft tissues and bony thorax are unremarkable. IMPRESSION: 1. Mild bibasilar atelectasis without other significant airspace disease. Electronically Signed   By: San Morelle M.D.   On: 04/19/2015 16:27   I have personally reviewed and evaluated these images and lab results as part of my  medical decision-making.   EKG Interpretation None      MDM   Final diagnoses:  Asthma exacerbation  Visit for wound check   39 year old female presenting with cough and wheezing and is also concerned about her C-section wound. Patient presentation consistent with asthma exacerbation. Treated with duoneb in ED with significant improvement in lung sounds and symptoms. There are no current signs of respiratory distress. Abdomen is soft without peritoneal signs. Well-healing vertical C-section scar. Small amount of dehiscence without signs of infection. Instructed patient on proper wound care. Patient already has a scheduled follow-up with her OB/GYN tomorrow. Prednisone given in the ED and pt will be discharged with 5 day burst. Pt has albuterol at home. Instructed patient to feed infant formula until follow-up with OB/GYN to discuss prednisone and breast-feeding. Pt states they are breathing at baseline before discharge. Pt has been instructed to follow up with their PCP regarding today's ED visit. Return precautions given in discharge paperwork and discussed with pt at bedside. Pt stable for discharge   Josephina Gip, PA-C 04/19/15 2033  Orlie Dakin, MD 04/21/15 [phone]

## 2015-05-05 ENCOUNTER — Encounter: Payer: Self-pay | Admitting: Cardiology

## 2015-05-05 ENCOUNTER — Ambulatory Visit (INDEPENDENT_AMBULATORY_CARE_PROVIDER_SITE_OTHER): Payer: Medicaid Other | Admitting: Cardiology

## 2015-05-05 VITALS — BP 110/70 | HR 80 | Ht 64.0 in | Wt 221.1 lb

## 2015-05-05 DIAGNOSIS — R0602 Shortness of breath: Secondary | ICD-10-CM | POA: Diagnosis not present

## 2015-05-05 DIAGNOSIS — R0600 Dyspnea, unspecified: Secondary | ICD-10-CM

## 2015-05-05 DIAGNOSIS — R1310 Dysphagia, unspecified: Secondary | ICD-10-CM | POA: Diagnosis not present

## 2015-05-05 DIAGNOSIS — R5383 Other fatigue: Secondary | ICD-10-CM | POA: Diagnosis not present

## 2015-05-05 LAB — CBC WITH DIFFERENTIAL/PLATELET
Basophils Absolute: 0 10*3/uL (ref 0.0–0.1)
Basophils Relative: 0 % (ref 0–1)
Eosinophils Absolute: 0.3 10*3/uL (ref 0.0–0.7)
Eosinophils Relative: 3 % (ref 0–5)
HCT: 30.3 % — ABNORMAL LOW (ref 36.0–46.0)
Hemoglobin: 9.3 g/dL — ABNORMAL LOW (ref 12.0–15.0)
Lymphocytes Relative: 25 % (ref 12–46)
Lymphs Abs: 2.9 10*3/uL (ref 0.7–4.0)
MCH: 23 pg — ABNORMAL LOW (ref 26.0–34.0)
MCHC: 30.7 g/dL (ref 30.0–36.0)
MCV: 74.8 fL — ABNORMAL LOW (ref 78.0–100.0)
MPV: 10 fL (ref 8.6–12.4)
Monocytes Absolute: 0.7 10*3/uL (ref 0.1–1.0)
Monocytes Relative: 6 % (ref 3–12)
Neutro Abs: 7.5 10*3/uL (ref 1.7–7.7)
Neutrophils Relative %: 66 % (ref 43–77)
Platelets: 388 10*3/uL (ref 150–400)
RBC: 4.05 MIL/uL (ref 3.87–5.11)
RDW: 15.8 % — ABNORMAL HIGH (ref 11.5–15.5)
WBC: 11.4 10*3/uL — ABNORMAL HIGH (ref 4.0–10.5)

## 2015-05-05 LAB — COMPREHENSIVE METABOLIC PANEL
ALT: 7 U/L (ref 6–29)
AST: 11 U/L (ref 10–30)
Albumin: 3.9 g/dL (ref 3.6–5.1)
Alkaline Phosphatase: 93 U/L (ref 33–115)
BUN: 11 mg/dL (ref 7–25)
CO2: 24 mmol/L (ref 20–31)
Calcium: 9.1 mg/dL (ref 8.6–10.2)
Chloride: 105 mmol/L (ref 98–110)
Creat: 0.67 mg/dL (ref 0.50–1.10)
Glucose, Bld: 119 mg/dL — ABNORMAL HIGH (ref 65–99)
Potassium: 3.7 mmol/L (ref 3.5–5.3)
Sodium: 140 mmol/L (ref 135–146)
Total Bilirubin: 0.3 mg/dL (ref 0.2–1.2)
Total Protein: 7 g/dL (ref 6.1–8.1)

## 2015-05-05 LAB — BRAIN NATRIURETIC PEPTIDE: Brain Natriuretic Peptide: 14.8 pg/mL (ref 0.0–100.0)

## 2015-05-05 LAB — T4, FREE: Free T4: 0.99 ng/dL (ref 0.80–1.80)

## 2015-05-05 LAB — TSH: TSH: 0.95 u[IU]/mL (ref 0.350–4.500)

## 2015-05-05 NOTE — Progress Notes (Signed)
Patient ID: Lisa Moreno, female   DOB: [birth year], 40 y.o.   MRN: [id number]      Cardiology Office Note  Date:  05/05/2015   ID:  Lisa Moreno, [place] [birth year], MRN [id number]  PCP:  Jeanann LewandowskyJEGEDE, OLUGBEMIGA, MD  Cardiologist: Lars MassonNELSON, Osha Rane H, MD   Chief complain: SOB   History of Present Illness: Lisa Moreno is a 40 y.o. female who presents for evaluation of chest pain and dyspnea on exertion. She has developed worsening DOE during the pregnancy, currently in 3418. Week. She has chest pain and neck pain for about a week and feels like her occipital area is burning. No syncope. She had hypertension prior to the pregnancy and was using lisinopril that was discontinued. Her BP gets high at the time but was ok on the last two doctors visits. She doesn't have BP cuff at home.  No LE edema, orthopnea, PND.   01/22/2015 - 6 weeks follow up, she continues to have SOB - minimal at rest, worse on exertion, unable to walk more than 1 flight of stairs, has to rest. 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 No palpitations or syncope, minimal LE edema, no orthopnea or PND.   03/16/2015 - she is now 32 weeks, 3 days pregnant, no complications, denies chest pain, stable DOE, no orthopnea, LE edema, PND. No palpitations or syncope, no hypertension.   05/05/2015 - the patient is coming after 2 months, she delivered a healthy baby boy [name] on 04/09/15 via C section followed by hysterectomy. She has been feeling profoundly tired. She was found to be anemic, Hb 8.6 on 04/19/15, not on iron as she has severe constipation requiring mechanical removal in the ER in the past. She feels tired and SOB, she feels like there is a mechanical obstruction on swallowing in her throat. She has PND. Feeds baby every hour at night.No palpitations, syncope or chest pain.   Past Medical History  Diagnosis Date  .  Asthma   . Hypertension   . Diabetes mellitus without complication ([facility])   . GERD (gastroesophageal reflux disease)   . Gestational diabetes    Past Surgical History  Procedure Laterality Date  . Breast reduction surgery    . Tonsillectomy    . Abdominal hysterectomy     Current Outpatient Prescriptions  Medication Sig Dispense Refill  . ACCU-CHEK FASTCLIX LANCETS MISC 1 Units by Percutaneous route 4 (four) times daily. 100 each 12  . albuterol (PROVENTIL HFA;VENTOLIN HFA) 108 (90 BASE) MCG/ACT inhaler Inhale 2 puffs into the lungs every 6 (six) hours as needed for wheezing. 1 Inhaler 11  . gabapentin (NEURONTIN) 300 MG capsule Take 1 capsule (300 mg total) by mouth at bedtime. 30 capsule 11  . glucose blood test strip Use as instructed to check blood glucose 4 times daily. 100 each 12  . glyBURIDE (DIABETA) 2.5 MG tablet Take by mouth daily. Take 1 1/2 tablets by mouth once daily in the morning    . ibuprofen (ADVIL) 200 MG tablet Take 800 mg by mouth 3 (three) times daily as needed for moderate pain.     Marland Kitchen. ibuprofen (ADVIL,MOTRIN) 800 MG tablet Take 1 tablet by mouth 2 (two) times daily. pain  0  . magnesium citrate SOLN Take 296 mLs (1 Bottle total) by mouth once. 195 mL 0  . metFORMIN (GLUCOPHAGE) 1000 MG tablet Take 1  tablet (1,000 mg total) by mouth 2 (two) times daily with a meal. 30 tablet 3  . phenylephrine-shark liver oil-mineral oil-petrolatum (PREPARATION H) 0.25-3-14-71.9 % rectal ointment Place 1 application rectally 4 (four) times daily as needed for hemorrhoids.    . polyethylene glycol powder (GLYCOLAX/MIRALAX) powder Take 17-34 g by mouth daily as needed for moderate constipation.    . ranitidine (ZANTAC) 150 MG tablet Take 1 tablet (150 mg total) by mouth 2 (two) times daily. 60 tablet 2   No current facility-administered medications for this visit.   Allergies:   Decadron   Social History:  The patient  reports that she has never smoked. She has never used smokeless  tobacco. She reports that she does not drink alcohol or use illicit drugs.   Family History:  The patient's family history includes Depression in her mother; Diabetes in her father and mother; Hypertension in her father and mother; Kidney disease in her father; Stroke in her father and mother.   ROS:  Please see the history of present illness.   Otherwise, review of systems are positive for none.   All other systems are reviewed and negative.   PHYSICAL EXAM: VS:  BP 110/70 mmHg  Pulse 80  Ht 5\' 4"  (1.626 m)  Wt 221 lb 1.9 oz (100.299 kg)  BMI 37.94 kg/m2  LMP 08/05/2014 , BMI Body mass index is 37.94 kg/(m^2). GEN: Well nourished, well developed, in no acute distress HEENT: normal Neck: no JVD, carotid bruits, thyroid appears enlarged. Cardiac: RRR; no murmurs, rubs, or gallops,no edema  Respiratory:  clear to auscultation bilaterally, normal work of breathing GI: soft, nontender, consistent with 3.trimester of pregnancy MS: no deformity or atrophy Skin: warm and dry, no rash Neuro:  Strength and sensation are intact Psych: euthymic mood, full affect  EKG:  EKG is not ordered today. The ekg ordered today demonstrates SR, iRBBB  Recent Labs: 04/19/2015: ALT 10*; BUN 9; Creatinine, Ser 0.63; Hemoglobin 8.6*; Platelets 428*; Potassium 3.7; Sodium 143   Lipid Panel    Component Value Date/Time   CHOL 148 03/30/2014 1547   TRIG 101 03/30/2014 1547   HDL 38* 03/30/2014 1547   CHOLHDL 3.9 03/30/2014 1547   VLDL 20 03/30/2014 1547   LDLCALC 90 03/30/2014 1547   Wt Readings from Last 3 Encounters:  05/05/15 221 lb 1.9 oz (100.299 kg)  04/19/15 236 lb (107.049 kg)  03/30/15 236 lb 6.4 oz (107.23 kg)    TTE: 12/17/2014  - Left ventricle: The cavity size was mildly dilated. Wall thickness was normal. Systolic function was normal. The estimated ejection fraction was in the range of 55% to 60%. Wall motion was normal; there were no regional wall motion abnormalities.  Left ventricular diastolic function parameters were normal. - Left atrium: The atrium was mildly dilated.  Impressions: - Normal LV systolic and diastolic function; mild LVE and LAE; trace MR and TR.    ASSESSMENT AND PLAN:  1. SOB, PND - seems very atypical, echocardiogram in 8/16 mildly dilated LV, normal LVEF, we will repeat now. This is possibly sec to severe anemia.  2. Fatigue - we will check echo, possible causes - combination of anemia and post hysterectomy, plus postpartum exhaustion and lack of sleep. Check CBC, CMP today.  3. Dysphasia - refer to ENT, thyroid appears enlarged, we will check TSH and free T4 today.  4. Hypertension - currently normotensive   Follow up in 2 months.  Signed, Lars Masson, MD  05/05/2015 10:15 AM  [facility] [place], [place], [place]  [address] Phone: [phone]; Fax: [phone]

## 2015-05-05 NOTE — Patient Instructions (Signed)
Medication Instructions:   Your physician recommends that you continue on your current medications as directed. Please refer to the Current Medication list given to you today.   Labwork:  TODAY--CMET, CBC W DIFF, TSH, T 4 FREE, BNP   Testing/Procedures:  Your physician has requested that you have an echocardiogram. Echocardiography is a painless test that uses sound waves to create images of your heart. It provides your doctor with information about the size and shape of your heart and how well your heart's chambers and valves are working. This procedure takes approximately one hour. There are no restrictions for this procedure.   You have been referred to ENT FOR PAROXYMAL NOCTURNAL DYSPNEA    Follow-Up:  2 MONTHS WITH DR Delton SeeNELSON     If you need a refill on your cardiac medications before your next appointment, please call your pharmacy.

## 2015-05-06 ENCOUNTER — Telehealth: Payer: Self-pay | Admitting: Cardiology

## 2015-05-06 NOTE — Telephone Encounter (Signed)
[facility] at [facility] calling to inform Dr Delton SeeNelson that the pt is scheduled to come in to see one of their ENTs, as referred by Dr Delton SeeNelson on 05/05/15, and the pt will have to pay $200 before she can be seen at that visit.  Per Rose, the cost is due to the pt having pregnancy Medicaid, and no other supplement insurance.  Per Rose the cost would be the same, even if her PCP were to refer her to their services.  Per Rose, she has left a message for the pt to call their office back to inform her of the cost of that visit, and to see if she agrees to pay this before seeing a ENT MD.  Okey Dupreose just wanted to keep Dr Delton SeeNelson in the loop of this, for the pt may not be able to afford the cost of seeing a ENT specialist at this time, until she uses different insurance services.  Informed [facility] at [facility] that I will route this message to Dr Delton SeeNelson to make her aware of this.  Asked Rose to give our office a call if the pt decides to proceed with paying the cost to see a ENT MD.  Okey Dupreose verbalized understanding and agrees with this plan.

## 2015-05-06 NOTE — Telephone Encounter (Signed)
New message    Referral to ENT will be cancel due to patient has pregnancy Medicaid . If the patient wants to be seen it will be $ 200.00 that she will need to bring to the visit.

## 2015-05-06 NOTE — Telephone Encounter (Signed)
I think that she needs it, but I understand the financial constraints.

## 2015-05-12 ENCOUNTER — Telehealth: Payer: Self-pay | Admitting: *Deleted

## 2015-05-12 NOTE — Telephone Encounter (Signed)
Lisa Moreno left a message on voicemail 05/11/15 2pm stating she would like someone who speaks Spanish to call  Her back about her postpartum situation- there was some difficulty understanding message due to accent.   Called Lisa Moreno with [employer] [place] and left a messge we are returning your call- please call back.

## 2015-05-13 NOTE — Telephone Encounter (Signed)
Called patient using [employer] interpreter [phone]. Was able to reach her at her home number. I stated I was returning her call in regards to a problem she was having. She stated that she didn't want to appear rude, but she was in an appointment and needed me to call her back in about 20 minutes. She said she really did want to talk about a problem she was experiencing but she was busy at this time. I told her I would try her back in a few minutes.

## 2015-05-13 NOTE — Telephone Encounter (Signed)
Returned patient call using [employer] interpreter [phone]. Spoke with patient at length about multiple complaints. She had her baby at [facility] 5 weeks ago. She had issue with pain and weakness in pelvis and legs but was discharged. Since then she has been having problems getting around due to pain in her hips and pelvis. She has also had referrals to cardiology and to ENT but is having issues with being seen due to not having insurance. She is having swallowing issues and was told she is anemic. She stated that she was told she would get PT after she came home but no one has called her. Patient has an appointment on 1/27. After speaking with patient at length, I asked if she would be agreeable to coming in at an earlier date. She agreed. I spoke with K. Rassette RN who was able to schedule her to come in to see a provider at 2:45 on 1/23. However the patient decided she could not come in at that time due to a conflicting appointment. She stated she would just come in on 1/27 as previously scheduled.

## 2015-05-17 ENCOUNTER — Ambulatory Visit: Payer: Medicaid Other | Admitting: Obstetrics & Gynecology

## 2015-05-20 ENCOUNTER — Other Ambulatory Visit: Payer: Self-pay

## 2015-05-20 ENCOUNTER — Ambulatory Visit (HOSPITAL_COMMUNITY): Payer: Medicaid Other | Attending: Cardiology

## 2015-05-20 DIAGNOSIS — R0602 Shortness of breath: Secondary | ICD-10-CM | POA: Diagnosis not present

## 2015-05-20 DIAGNOSIS — I10 Essential (primary) hypertension: Secondary | ICD-10-CM | POA: Diagnosis not present

## 2015-05-20 DIAGNOSIS — E119 Type 2 diabetes mellitus without complications: Secondary | ICD-10-CM | POA: Insufficient documentation

## 2015-05-20 DIAGNOSIS — R0600 Dyspnea, unspecified: Secondary | ICD-10-CM

## 2015-05-20 DIAGNOSIS — R5383 Other fatigue: Secondary | ICD-10-CM

## 2015-05-20 DIAGNOSIS — M797 Fibromyalgia: Secondary | ICD-10-CM | POA: Diagnosis not present

## 2015-05-20 DIAGNOSIS — Z6837 Body mass index (BMI) 37.0-37.9, adult: Secondary | ICD-10-CM | POA: Insufficient documentation

## 2015-05-21 ENCOUNTER — Ambulatory Visit (INDEPENDENT_AMBULATORY_CARE_PROVIDER_SITE_OTHER): Payer: Medicaid Other | Admitting: Obstetrics and Gynecology

## 2015-05-21 ENCOUNTER — Ambulatory Visit: Payer: Medicaid Other | Admitting: Family Medicine

## 2015-05-21 VITALS — BP 129/82 | HR 72 | Temp 98.7°F | Ht 60.0 in | Wt 225.7 lb

## 2015-05-21 DIAGNOSIS — Z789 Other specified health status: Secondary | ICD-10-CM

## 2015-05-21 DIAGNOSIS — E119 Type 2 diabetes mellitus without complications: Secondary | ICD-10-CM

## 2015-05-21 DIAGNOSIS — M5430 Sciatica, unspecified side: Secondary | ICD-10-CM | POA: Diagnosis not present

## 2015-05-21 DIAGNOSIS — Z794 Long term (current) use of insulin: Secondary | ICD-10-CM

## 2015-05-21 MED ORDER — METFORMIN HCL 1000 MG PO TABS: 500.0000 mg | ORAL_TABLET | Freq: Every day | ORAL | Status: DC

## 2015-05-21 MED ORDER — RANITIDINE HCL 150 MG PO TABS: 150.0000 mg | ORAL_TABLET | Freq: Two times a day (BID) | ORAL | Status: DC

## 2015-05-21 NOTE — Progress Notes (Signed)
Subjective:     Lisa Moreno is a 40 y.o. female who presents for a postpartum visit. 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 I have fully reviewed the prenatal and intrapartum course. The delivery was at 35.3 gestational weeks. 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 Anesthesia: general. Postpartum course has been complicated by bilateral lower extremity numbness for which she was followed by a physical therapist. Baby's course has been by prolonged hospitalization secondary to issues related to prematurity. Baby is feeding by bottle - Similac Alimentum. Bleeding no bleeding. Bowel function is normal. Bladder function is normal. Patient is not sexually active. Contraception method is none. Postpartum depression screening: negative.     Review of Systems Pertinent items are noted in HPI.   Objective:    BP 129/82 mmHg  Pulse 72  Temp(Src) 98.7 F (37.1 C) (Oral)  Ht 5' (1.524 m)  Wt 225 lb 11.2 oz (102.377 kg)  BMI 44.08 kg/m2  LMP 08/05/2014  General:  alert, cooperative and no distress   Breasts:  inspection negative, no nipple discharge or bleeding, no masses or nodularity palpable  Lungs: clear to auscultation bilaterally  Heart:  regular rate and rhythm  Abdomen: soft, non-tender; bowel sounds normal; no masses,  no organomegaly and incision: completely healed   Vulva:  normal  Vagina: normal vagina, no discharge, exudate, lesion, or erythema and vaginal vault intact  Cervix:  absent  Corpus: uterus absent  Adnexa:  normal adnexa and no mass, fullness, tenderness  Rectal Exam: Not performed.        Assessment:     Normal postpartum exam. Pap smear not done at today's visit.   Plan:    1. Contraception: none 2. Patient is medically cleared to resume all activities of daily living 3. Patient will  be referred to physical therapy as she expresses some difficulty with ambulation. Referred with a PCP as well 4. Rx Metformin provided as she has not been taking it postpartum 5. Follow up as needed.

## 2015-05-21 NOTE — Addendum Note (Signed)
Addended by: Faythe Casa on: 05/21/2015 12:01 PM   Modules accepted: Orders

## 2015-05-21 NOTE — Progress Notes (Signed)
[name] appt scheduled for Feb 2, @ 1145.  Pt is aware to bring in $200 to the appt for the evaluation.  [facility] contact info given to pt.

## 2015-05-26 NOTE — Addendum Note (Signed)
Addended by: Faythe Casa on: 05/26/2015 11:37 AM   Modules accepted: Orders

## 2015-05-26 NOTE — Addendum Note (Signed)
Addended by: Faythe Casa on: 05/26/2015 11:44 AM   Modules accepted: Orders

## 2015-05-27 ENCOUNTER — Ambulatory Visit: Payer: Medicaid Other

## 2015-06-08 ENCOUNTER — Other Ambulatory Visit: Payer: Self-pay | Admitting: Obstetrics and Gynecology

## 2015-06-10 ENCOUNTER — Ambulatory Visit: Payer: Medicaid Other | Attending: Internal Medicine | Admitting: Physical Therapy

## 2015-06-11 ENCOUNTER — Telehealth: Payer: Self-pay | Admitting: General Practice

## 2015-06-11 NOTE — Telephone Encounter (Signed)
Patient called into front office and left message with Darl Pikes stating she needed multiple refills on ibuprofen, gabapentin, and her strips and lancets as her insurance is about to run out. Called patient with pacific interpreter [phone] and discussed sending limited refills to pharmacy until she could get in with [facility]. Patient states she cannot go there because she couldn't get assistance due to income level. Discussed with patient making an appt with them & she could have the financial piece reevaluated as she has not seen them since 2015. Patient kept arguing about her insurance running out and needing help with medications. Patient states she has to go because her child is heading into surgery. Patient then hung up phone. Called [facility] and made appt for patient for 2/22 @ 430. [facility] states patient still has active medicaid through the end of the month if not longer & patient should attend appt with or without insurance. [facility] states they can look into the financial piece when she comes to that appt and she should ask for [facility]. Called patient back and informed her of appt and importance of going no matter what and that she should ask for Porfirio Mylar that day to talk about assistance. Patient verbalized understanding & had no questions

## 2015-06-16 ENCOUNTER — Ambulatory Visit: Payer: Medicaid Other | Attending: Family Medicine | Admitting: Family Medicine

## 2015-06-16 ENCOUNTER — Encounter: Payer: Self-pay | Admitting: Family Medicine

## 2015-06-16 ENCOUNTER — Encounter: Payer: Self-pay | Admitting: *Deleted

## 2015-06-16 VITALS — BP 121/80 | HR 68 | Temp 98.3°F | Resp 18 | Ht 64.0 in | Wt 227.0 lb

## 2015-06-16 DIAGNOSIS — M25552 Pain in left hip: Secondary | ICD-10-CM | POA: Insufficient documentation

## 2015-06-16 DIAGNOSIS — E119 Type 2 diabetes mellitus without complications: Secondary | ICD-10-CM | POA: Diagnosis present

## 2015-06-16 DIAGNOSIS — Z90710 Acquired absence of both cervix and uterus: Secondary | ICD-10-CM | POA: Insufficient documentation

## 2015-06-16 DIAGNOSIS — O24112 Pre-existing diabetes mellitus, type 2, in pregnancy, second trimester: Secondary | ICD-10-CM

## 2015-06-16 DIAGNOSIS — M797 Fibromyalgia: Secondary | ICD-10-CM | POA: Diagnosis present

## 2015-06-16 DIAGNOSIS — G8929 Other chronic pain: Secondary | ICD-10-CM | POA: Diagnosis not present

## 2015-06-16 DIAGNOSIS — R059 Cough, unspecified: Secondary | ICD-10-CM

## 2015-06-16 DIAGNOSIS — D509 Iron deficiency anemia, unspecified: Secondary | ICD-10-CM | POA: Insufficient documentation

## 2015-06-16 DIAGNOSIS — I10 Essential (primary) hypertension: Secondary | ICD-10-CM | POA: Insufficient documentation

## 2015-06-16 DIAGNOSIS — F329 Major depressive disorder, single episode, unspecified: Secondary | ICD-10-CM | POA: Insufficient documentation

## 2015-06-16 DIAGNOSIS — R05 Cough: Secondary | ICD-10-CM | POA: Diagnosis not present

## 2015-06-16 DIAGNOSIS — M25551 Pain in right hip: Secondary | ICD-10-CM | POA: Insufficient documentation

## 2015-06-16 HISTORY — DX: Cough, unspecified: R05.9

## 2015-06-16 LAB — POCT GLYCOSYLATED HEMOGLOBIN (HGB A1C): HEMOGLOBIN A1C: 5.5

## 2015-06-16 LAB — GLUCOSE, POCT (MANUAL RESULT ENTRY): POC GLUCOSE: 143 mg/dL — AB (ref 70–99)

## 2015-06-16 MED ORDER — FLUOXETINE HCL 20 MG PO TABS: 20.0000 mg | ORAL_TABLET | Freq: Every day | ORAL | Status: DC

## 2015-06-16 MED ORDER — METFORMIN HCL 500 MG PO TABS
500.0000 mg | ORAL_TABLET | Freq: Two times a day (BID) | ORAL | Status: DC
Start: 2015-06-16 — End: 2017-10-15

## 2015-06-16 MED ORDER — AZITHROMYCIN 250 MG PO TABS: ORAL_TABLET | ORAL | Status: DC

## 2015-06-16 NOTE — Patient Instructions (Signed)
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   Para la tos:   1. Placa del pecho.  2. Tratamiento para una bronquitis con AZITHROMYCIN  tabletas, tome 2 tabletas el primer dia, despues 1 tableta diaria por 4 dias mas.   Para el dolor, y la depresion:   1. FLUOXETINE , tome una tableta por boca, por la manana.  2. Regresar para una cita en 2 a 3 semanas.   Para la anemia:  1. Estamos chequeando un conteo de sangre y un perfil de hierro hoy.   FOLLOW UP WITH PRIMARY PHYSICIAN IN 2 to 3 WEEKS.  CSW

## 2015-06-16 NOTE — Progress Notes (Signed)
Patient is here to [facility]  Patient complains of Bilateral Hip and lower back pain. Pain is scaled currently at a 7. Patient has had this pain since delivery of her child and Hysterectomy in Dec 2016.  Patient declined the flu shot today.

## 2015-06-16 NOTE — Progress Notes (Signed)
   Subjective:    Patient ID: Lisa Moreno, female    DOB: May 02, 1975, 40 y.o.   MRN: [id number]  HPI Patient with history of DM type 2, fibromyalgia, HTN, here for re-establishment of care.  She had c-section delivery December [facility] and total hysterectomy at that time, pregnancy complicated by placenta accretia. Infant son (born [birth year] 06[birth year] 2015) with significant disabilities and cared for at [facility].  Patient with several concerns:  1. She is exhausted, reports feeling depressive sxs because of a lack of rest. Husband helps when he can, works and has other demands. Child's disabilities make it hard to find outside help.  Patient does not sleep much.  Denies SI or HI.  Has had issues with depression "as a child" but not previously on antidepressive medications.  Was on zolpidem at one point.  No other substances.   2. Continued bilateral hip pain, incisional pain at the site of C-section since the surgery. Has addressed with her OB/GYN, continues to be a problem.  Previously was on gabapentin, however cannot take due to sedative properties.   3. DM2, was controlled with metformin  at bedtime. Feels this is too strong. A1C 5.5% today.   4. Cough productive of sputum since mid-December. Had subjective fever 1 month ago, then fever resolved. COntinues with cough. Nasal dsicharge. Coughing paroxysms that are heavy.  [place], did not help.   5. Anemia, microcytic. Worried about this. No bleeding at this time.   Social Hx Not breastfeeding regularly (occasionally puts to breast to calm).   Review of Systems No fevers present;y; poor sleep, no chest pain. Depression without SI/SA, HI or HA.  Some constipation, no blood per rectum.      Objective:   Physical Exam Well, no distress.  HEENT Neck supple. No conjunctival pallor. MMM No frontal or maxillary sinus tenderness. TMs clear. Oropharynx clear. Watery nasal dsicharge.  COR  Regular S1S2 PULM Clear bilaterally with good air movement.  ABD Incision site clean and dry, intact. Tenderness with palpation laterally to both sides of incision. No masses or megaly.  MSK tenderness bilateral hips with SLR.        Assessment & Plan:  1. Depression; chronic pain; trial SSRI for pain management as well as antidepressant effect. CSW, to consider counseling options as well. Patient has many doctor appointments in [place] for her son, which makes this difficult for her.   2. DM2_ A1C is excellent today. To reduce dose, make BID metformin. Not taking Sulfonylurea any longer. Taken off med list.   3. Anemia, microcytic. CBC, iron and ferritin.  May benefit from iron supplementatino.   4. Prolonged period of coughing paroxysms. Macrolide, CXR.   For follow up on SSRI effects in 2 to 3 weeks.   Paula Compton, MD

## 2015-06-17 LAB — MICROALBUMIN / CREATININE URINE RATIO
CREATININE, URINE: 268 mg/dL (ref 20–320)
Microalb Creat Ratio: 6 mcg/mg creat (ref <30)
Microalb, Ur: 1.7 mg/dL

## 2015-06-17 LAB — CBC
HEMATOCRIT: 29.6 % — AB (ref 36.0–46.0)
HEMOGLOBIN: 8.8 g/dL — AB (ref 12.0–15.0)
MCH: 20.9 pg — AB (ref 26.0–34.0)
MCHC: 29.7 g/dL — AB (ref 30.0–36.0)
MCV: 70.3 fL — AB (ref 78.0–100.0)
MPV: 9.8 fL (ref 8.6–12.4)
Platelets: 362 10*3/uL (ref 150–400)
RBC: 4.21 MIL/uL (ref 3.87–5.11)
RDW: 16.6 % — AB (ref 11.5–15.5)
WBC: 10.7 10*3/uL — AB (ref 4.0–10.5)

## 2015-06-17 LAB — IRON: IRON: 14 ug/dL — AB (ref 40–190)

## 2015-06-17 LAB — FERRITIN: Ferritin: 17 ng/mL (ref 10–232)

## 2015-06-22 ENCOUNTER — Telehealth: Payer: Self-pay | Admitting: *Deleted

## 2015-06-22 NOTE — Telephone Encounter (Signed)
Medical Assistant used [employer] to contact patient.  Interpreter Name: Interpreter #: [phone]  Medical Assistant left message on patient's home and cell voicemail. Voicemail states to give a call back to [place] with [facility] at [phone].

## 2015-06-22 NOTE — Telephone Encounter (Signed)
Pt. Returned call. Please f/u with pt. °

## 2015-06-22 NOTE — Telephone Encounter (Signed)
-----   Message from Barbaraann Barthel, MD sent at 06/17/2015  2:11 PM EST ----- Please let patient know hemoglobin 8.8 (close to previous value of 9.3); iron is low.  For follow up with primary physician regarding need for further workup for iron deficiency anemia.  JB

## 2015-07-08 NOTE — Telephone Encounter (Signed)
Medical Assistant used [employer] to contact patient.  Interpreter Name:Jose Interpreter #: [id number] Medical Assistant left message on patient's home and cell voicemail. Voicemail states to give a call back to Shelina Luo with [facility] at [phone].  

## 2015-07-09 ENCOUNTER — Encounter: Payer: Self-pay | Admitting: *Deleted

## 2015-07-12 NOTE — Telephone Encounter (Signed)
Completed-  called to review glucose readings per Dr. Alvester MorinNewton. Pt c/o Nausea, vomitting, poor appetitie. Glucose readings FBS 110-141 2hpp 84-202. Told pt I will having clinic staff call her to f/u. Appt 11/21 @ 10:30.

## 2015-07-15 ENCOUNTER — Ambulatory Visit: Payer: Medicaid Other | Admitting: Cardiology

## 2016-04-20 IMAGING — US US MFM FETAL BPP W/O NON-STRESS
1 series · 12 of 24 positions shown · non-contrast
Comparison: none

[Series 1: us mfm fetal bpp w/o non-stress · 24 acquisitions, 12 frames shown]
[im 2/24]
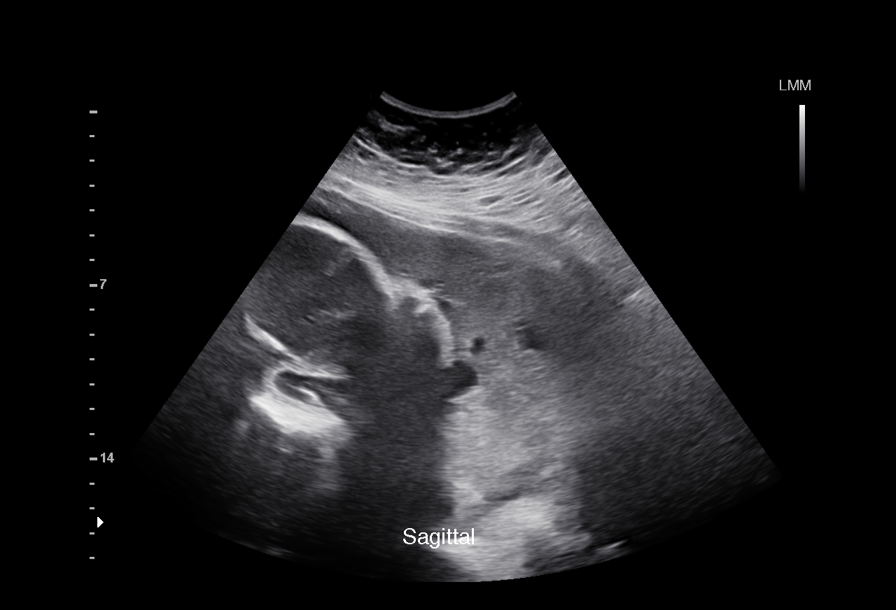
[im 4/24]
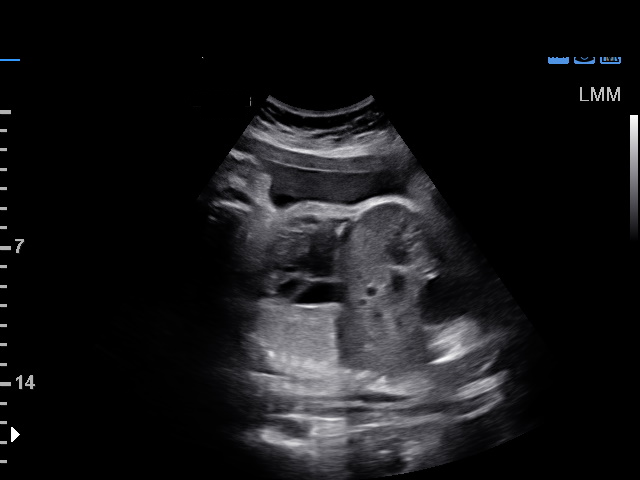
[im 6/24]
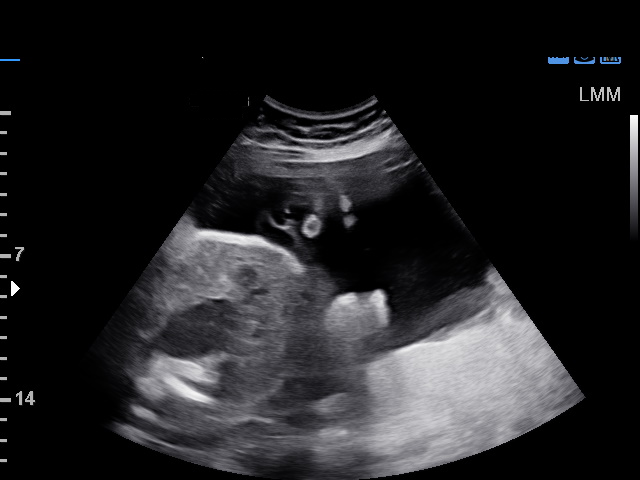
[im 8/24]
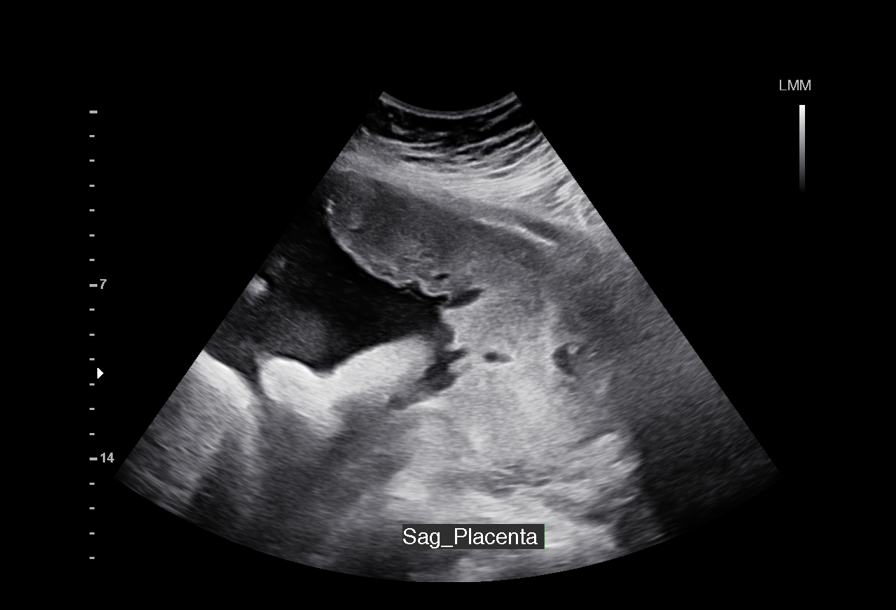
[im 10/24]
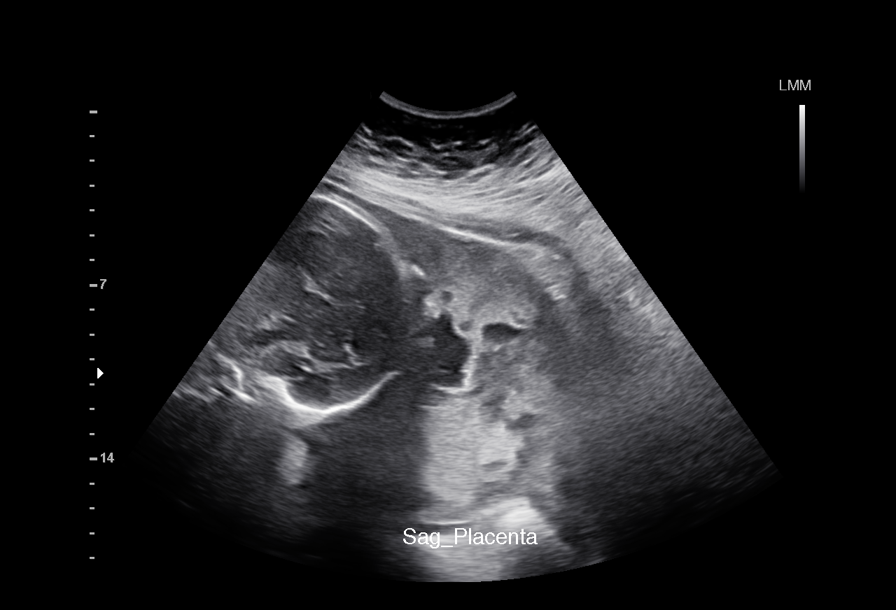
[im 12/24]
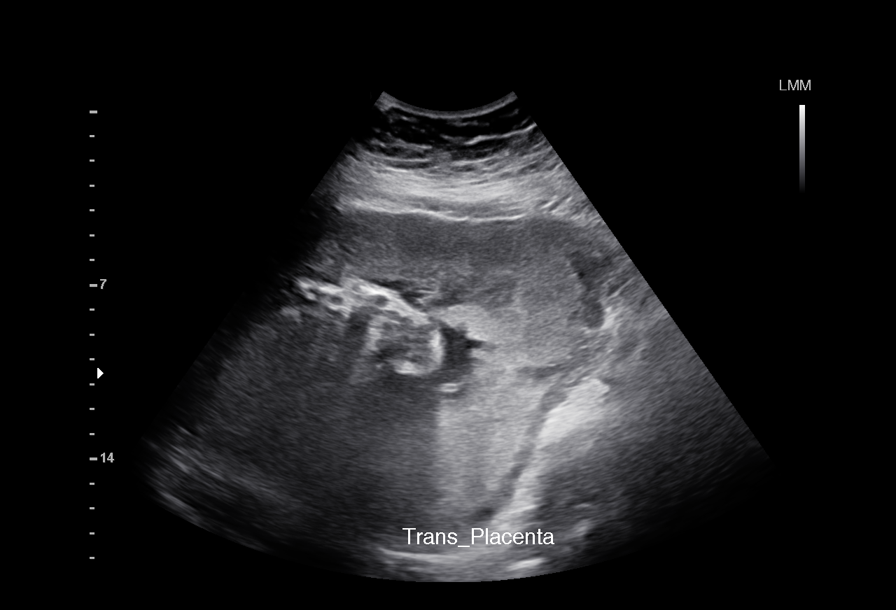
[im 14/24]
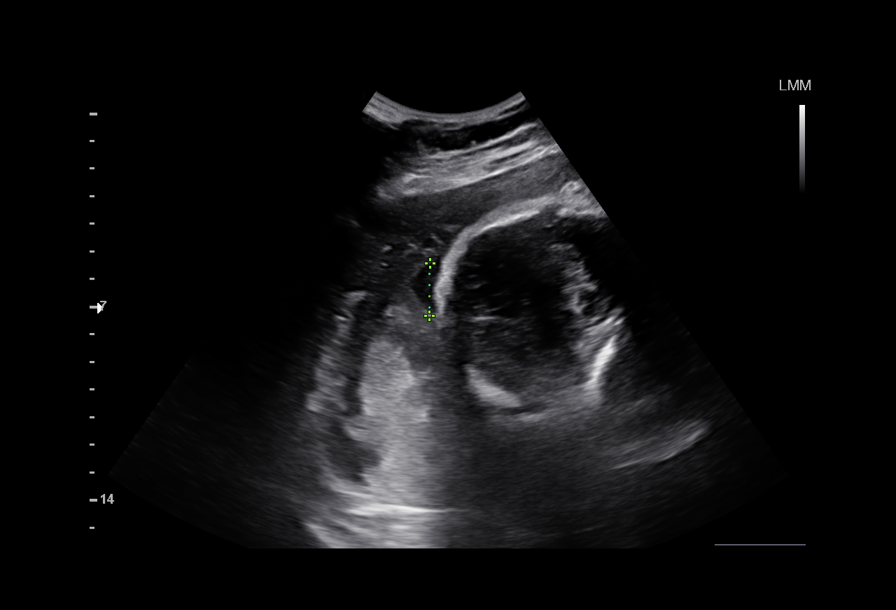
[im 16/24]
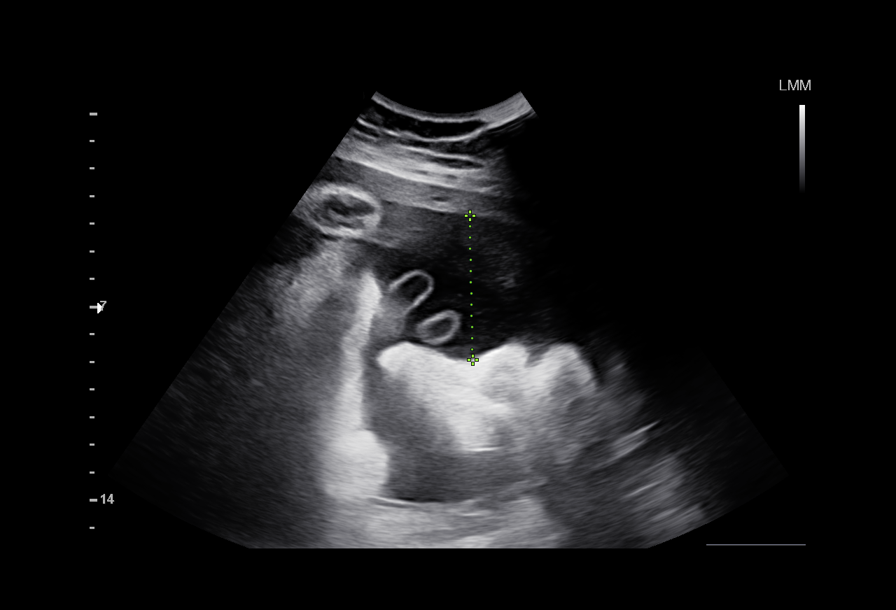
[im 18/24]
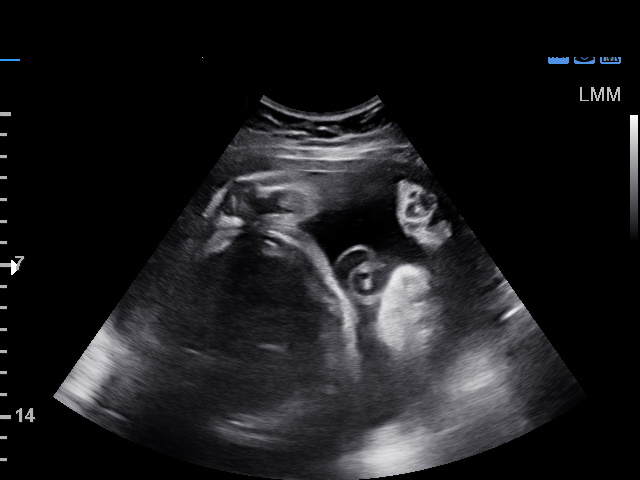
[im 20/24]
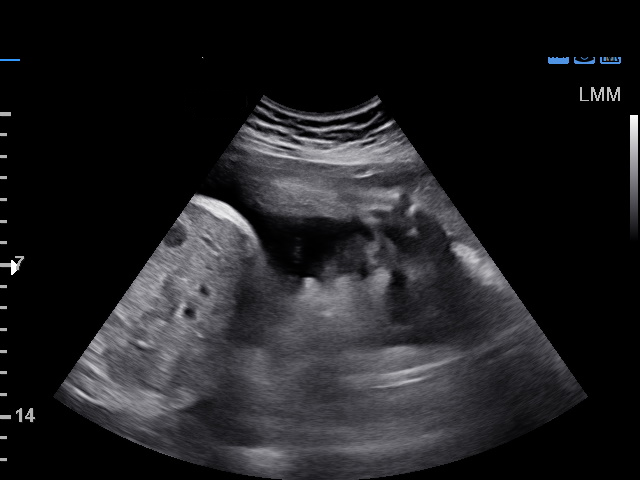
[im 22/24]
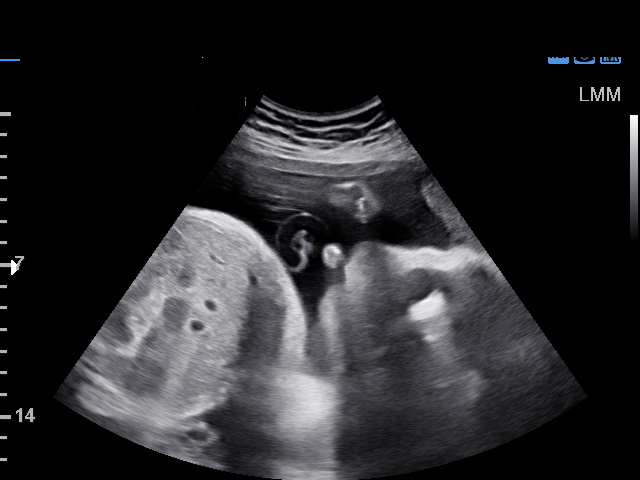
[im 24/24]
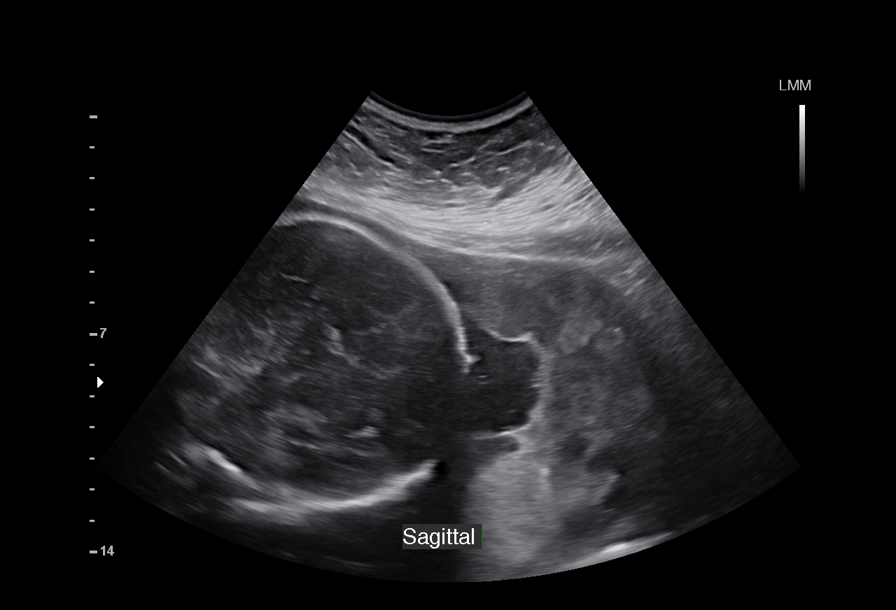

[12 of 24 positions shown; findings below may reference images not displayed]

am)

Name:       WINNIE TIGER                    Visit  03/30/2015 [DATE]
CLOSET DE QC                               Date:

Faculty Physician
[facility]inic
[REDACTED]

1  GUY LAURENT BABAR            [id number]       [id number]     [id number]
Indications

Fetal abnormality - other known or
suspected (bilateral club feet, abnormal
lumbar spine)
Placenta previa specified as without
hemorrhage, third trimester; possible
accreta
Diabetes - Pregestational, 3rd trimester
(glyburide, metformin)
Advanced maternal age multigravida 39,
third trimester - declined NIPS & amnio
Obesity complicating pregnancy, third
trimester
Hypertension - Chronic/Pre-existing (no
meds)
34 weeks gestation of pregnancy
OB History
Gravidity:     2         Term:  1        Prem:    0        SAB:   0
TOP:           0       Ectopic  0        Living:  1
:
Fetal Evaluation

Num Of Fetuses:      1
Fetal Heart          143
Rate(bpm):
Cardiac Activity:    Observed
Presentation:        Cephalic
Placenta:            Central previa

Amniotic Fluid
AFI FV:      Subjectively within normal limits
AFI Sum:     18.9     cm      70  %Tile     Larg Pckt:      6.4  cm
RUQ:   5.4     cm    RLQ:   1.9     cm   LUQ:    5.2     cm   LLQ:    6.4    cm
Biophysical Evaluation

Amniotic F.V:   Pocket => 2 cm two          F. Tone:        Observed
planes
F. Movement:    Observed                    Score:          [DATE]
F. Breathing:   Observed
Gestational Age

LMP:           33w 6d        Date:  08/05/14                  EDD:   05/12/15
Best:          34w 3d    Det. By:   Early Ultrasound          EDD:   05/08/15
(10/17/14)
Impression

SIUP at 34+3 weeks
Normal amniotic fluid volume
BPP [DATE]
Recommendations

Delivery is planned for next week

## 2017-05-21 ENCOUNTER — Encounter (HOSPITAL_COMMUNITY): Payer: Self-pay | Admitting: *Deleted

## 2017-05-21 ENCOUNTER — Emergency Department (HOSPITAL_COMMUNITY)
Admission: EM | Admit: 2017-05-21 | Discharge: 2017-05-21 | Disposition: A | Discharge status: Home / Self Care | Payer: Self-pay | Attending: Emergency Medicine | Admitting: Emergency Medicine

## 2017-05-21 ENCOUNTER — Ambulatory Visit (HOSPITAL_COMMUNITY): Payer: Self-pay

## 2017-05-21 ENCOUNTER — Other Ambulatory Visit: Payer: Self-pay

## 2017-05-21 DIAGNOSIS — I10 Essential (primary) hypertension: Secondary | ICD-10-CM | POA: Insufficient documentation

## 2017-05-21 DIAGNOSIS — Z79899 Other long term (current) drug therapy: Secondary | ICD-10-CM | POA: Insufficient documentation

## 2017-05-21 DIAGNOSIS — J45909 Unspecified asthma, uncomplicated: Secondary | ICD-10-CM | POA: Insufficient documentation

## 2017-05-21 DIAGNOSIS — R102 Pelvic and perineal pain: Secondary | ICD-10-CM | POA: Insufficient documentation

## 2017-05-21 DIAGNOSIS — E119 Type 2 diabetes mellitus without complications: Secondary | ICD-10-CM | POA: Insufficient documentation

## 2017-05-21 DIAGNOSIS — Z7984 Long term (current) use of oral hypoglycemic drugs: Secondary | ICD-10-CM | POA: Insufficient documentation

## 2017-05-21 DIAGNOSIS — R10814 Left lower quadrant abdominal tenderness: Secondary | ICD-10-CM | POA: Insufficient documentation

## 2017-05-21 DIAGNOSIS — R10813 Right lower quadrant abdominal tenderness: Secondary | ICD-10-CM | POA: Insufficient documentation

## 2017-05-21 LAB — CBC WITH DIFFERENTIAL/PLATELET
BASOS ABS: 0 10*3/uL (ref 0.0–0.1)
Basophils Relative: 0 %
Eosinophils Absolute: 0.3 10*3/uL (ref 0.0–0.7)
Eosinophils Relative: 3 %
HEMATOCRIT: 35.2 % — AB (ref 36.0–46.0)
Hemoglobin: 11.4 g/dL — ABNORMAL LOW (ref 12.0–15.0)
Lymphocytes Relative: 29 %
Lymphs Abs: 2.6 10*3/uL (ref 0.7–4.0)
MCH: 24.6 pg — ABNORMAL LOW (ref 26.0–34.0)
MCHC: 32.4 g/dL (ref 30.0–36.0)
MCV: 75.9 fL — ABNORMAL LOW (ref 78.0–100.0)
MONO ABS: 0.7 10*3/uL (ref 0.1–1.0)
Monocytes Relative: 8 %
NEUTROS ABS: 5.3 10*3/uL (ref 1.7–7.7)
Neutrophils Relative %: 60 %
Platelets: 216 10*3/uL (ref 150–400)
RBC: 4.64 MIL/uL (ref 3.87–5.11)
RDW: 14.7 % (ref 11.5–15.5)
WBC: 8.9 10*3/uL (ref 4.0–10.5)

## 2017-05-21 LAB — COMPREHENSIVE METABOLIC PANEL
ALBUMIN: 3.1 g/dL — AB (ref 3.5–5.0)
ALT: 14 U/L (ref 14–54)
AST: 23 U/L (ref 15–41)
Alkaline Phosphatase: 101 U/L (ref 38–126)
Anion gap: 11 (ref 5–15)
BILIRUBIN TOTAL: 0.5 mg/dL (ref 0.3–1.2)
BUN: 8 mg/dL (ref 6–20)
CHLORIDE: 106 mmol/L (ref 101–111)
CO2: 21 mmol/L — ABNORMAL LOW (ref 22–32)
Calcium: 8.6 mg/dL — ABNORMAL LOW (ref 8.9–10.3)
Creatinine, Ser: 0.55 mg/dL (ref 0.44–1.00)
GFR calc Af Amer: >60 mL/min (ref >60)
GFR calc non Af Amer: >60 mL/min (ref >60)
GLUCOSE: 199 mg/dL — AB (ref 65–99)
POTASSIUM: 4 mmol/L (ref 3.5–5.1)
Sodium: 138 mmol/L (ref 135–145)
TOTAL PROTEIN: 6.7 g/dL (ref 6.5–8.1)

## 2017-05-21 LAB — URINALYSIS, ROUTINE W REFLEX MICROSCOPIC
Bilirubin Urine: NEGATIVE
GLUCOSE, UA: NEGATIVE mg/dL
HGB URINE DIPSTICK: NEGATIVE
Ketones, ur: NEGATIVE mg/dL
Leukocytes, UA: NEGATIVE
Nitrite: NEGATIVE
Protein, ur: NEGATIVE mg/dL
SPECIFIC GRAVITY, URINE: 1.021 (ref 1.005–1.030)
pH: 6 (ref 5.0–8.0)

## 2017-05-21 MED ORDER — HYDROMORPHONE HCL 1 MG/ML IJ SOLN
1.0000 mg | Freq: Once | INTRAMUSCULAR | Status: AC
  Administered 2017-05-21: 1 mg via INTRAVENOUS
  Filled 2017-05-21: qty 1

## 2017-05-21 MED ORDER — TRAMADOL HCL 50 MG PO TABS: 50.0000 mg | ORAL_TABLET | Freq: Four times a day (QID) | ORAL | 0 refills | Status: DC | PRN

## 2017-05-21 MED ORDER — ONDANSETRON HCL 4 MG/2ML IJ SOLN
4.0000 mg | Freq: Once | INTRAMUSCULAR | Status: AC
  Administered 2017-05-21: 4 mg via INTRAVENOUS
  Filled 2017-05-21: qty 2

## 2017-05-21 NOTE — ED Provider Notes (Signed)
[name] DEPART[facility] Provider Note   CSN: [id number] Arrival date & time: 05/21/17  1158     History   Chief Complaint Chief Complaint  Patient presents with  . Abdominal Pain    HPI Lisa Moreno is a 42 y.o. female.  Patient states that she is having pain in her right lower quadrant left lower quadrant.  She had similar discomfort with her ovaries before and has had a ruptured cyst and a twisted ovary.  No fevers no chills   The history is provided by the patient.  Abdominal Pain   This is a new problem. The current episode started 2 days ago. The problem occurs constantly. The problem has not changed since onset.The pain is associated with an unknown factor. Pertinent negatives include diarrhea, frequency, hematuria and headaches.    Past Medical History:  Diagnosis Date  . Asthma   . Diabetes mellitus without complication ([place])   . GERD (gastroesophageal reflux disease)   . Gestational diabetes   . Hypertension     Patient Active Problem List   Diagnosis Date Noted  . Microcytic anemia 06/16/2015  . Cough 06/16/2015  . SOB (shortness of breath) 05/05/2015  . PND (paroxysmal nocturnal dyspnea) 05/05/2015  . Fatigue 05/05/2015  . HTN (hypertension) 12/11/2014  . Placenta previa centralis 12/10/2014  . Abnormal findings on antenatal screening   . Advanced maternal age in multigravida   . Supervision of high risk pregnancy, antepartum 12/07/2014  . Pregnancy with type 2 diabetes mellitus in second trimester 11/09/2014  . Hypertension affecting pregnancy, antepartum 11/09/2014  . Rapid heart beat 11/09/2014  . Type 2 diabetes mellitus without complication ([place]) 12/87/8676  . Morbid obesity ([place]) 03/30/2014  . Fibromyalgia 01/08/2013    Past Surgical History:  Procedure Laterality Date  . ABDOMINAL HYSTERECTOMY    . BREAST REDUCTION SURGERY    . ovarian torsion    . TONSILLECTOMY      OB History    Gravida Para  Term Preterm AB Living   _0 0 0 2   SAB TAB Ectopic Multiple Live Births   0 0 0 0 1       Home Medications    Prior to Admission medications   Medication Sig Start Date End Date Taking? Authorizing Provider  ACCU-CHEK FASTCLIX LANCETS MISC 1 Units by Percutaneous route 4 (four) times daily. 02/18/15  Yes Anyanwu, Sallyanne Havers, MD  albuterol (PROVENTIL HFA;VENTOLIN HFA) 108 (90 BASE) MCG/ACT inhaler Inhale 2 puffs into the lungs every 6 (six) hours as needed for wheezing. 12/07/14  Yes Truett Mainland, DO  azithromycin (ZITHROMAX) 250 MG tablet Take 2 tablets by mouth on day one, then 1 tablet daily for 4 days 06/16/15   Willeen Niece, MD  Blood Glucose Monitoring Suppl (ACCU-CHEK NANO SMARTVIEW) w/Device KIT USE AS DIRECTED FOUR  TIMES  DAILY 06/08/15   Constant, Peggy, MD  FLUoxetine (PROZAC) 20 MG tablet Take 1 tablet (20 mg total) by mouth daily. 06/16/15   Willeen Niece, MD  gabapentin (NEURONTIN) 300 MG capsule Take 1 capsule (300 mg total) by mouth at bedtime. 01/18/15   Caren Macadam, MD  glucose blood test strip Use as instructed to check blood glucose 4 times daily. 12/09/14   Donnamae Jude, MD  ibuprofen (ADVIL,MOTRIN) 800 MG tablet Take 1 tablet by mouth 2 (two) times daily. Reported on 05/21/2015 04/09/15   [provider]  magnesium citrate SOLN Take 296  mLs (1 Bottle total) by mouth once. 03/20/15   Melancon, York Ram, MD  metFORMIN (GLUCOPHAGE) 500 MG tablet Take 1 tablet (500 mg total) by mouth 2 (two) times daily with a meal. 06/16/15   Willeen Niece, MD  phenylephrine-shark liver oil-mineral oil-petrolatum (PREPARATION H) 0.25-3-14-71.9 % rectal ointment Place 1 application rectally 4 (four) times daily as needed for hemorrhoids. Reported on 05/21/2015    [provider]  polyethylene glycol powder (GLYCOLAX/MIRALAX) powder Take 17-34 g by mouth daily as needed for moderate constipation. Reported on 05/21/2015    [provider]  ranitidine  (ZANTAC) 150 MG tablet Take 1 tablet (150 mg total) by mouth 2 (two) times daily. 05/21/15   Constant, Peggy, MD  traMADol (ULTRAM) 50 MG tablet Take 1 tablet (50 mg total) by mouth every 6 (six) hours as needed. 05/21/17   Milton Ferguson, MD    Family History Family History  Problem Relation Age of Onset  . Diabetes Mother   . Stroke Mother   . Hypertension Mother   . Depression Mother   . Diabetes Father   . Stroke Father   . Kidney disease Father   . Hypertension Father     Social History Social History   Tobacco Use  . Smoking status: Never Smoker  . Smokeless tobacco: Never Used  Substance Use Topics  . Alcohol use: No  . Drug use: No     Allergies   Decadron [dexamethasone]   Review of Systems Review of Systems  Constitutional: Negative for appetite change and fatigue.  HENT: Negative for congestion, ear discharge and sinus pressure.   Eyes: Negative for discharge.  Respiratory: Negative for cough.   Cardiovascular: Negative for chest pain.  Gastrointestinal: Positive for abdominal pain. Negative for diarrhea.  Genitourinary: Negative for frequency and hematuria.  Musculoskeletal: Negative for back pain.  Skin: Negative for rash.  Neurological: Negative for seizures and headaches.  Psychiatric/Behavioral: Negative for hallucinations.     Physical Exam Updated Vital Signs BP (!) 125/50   Pulse 67   Temp 97.7 F (36.5 C) (Oral)   Resp 18   LMP 08/05/2014   SpO2 96%   Physical Exam  Constitutional: She is oriented to person, place, and time. She appears well-developed.  HENT:  Head: Normocephalic.  Eyes: Conjunctivae and EOM are normal. No scleral icterus.  Neck: Neck supple. No thyromegaly present.  Cardiovascular: Normal rate and regular rhythm. Exam reveals no gallop and no friction rub.  No murmur heard. Pulmonary/Chest: No stridor. She has no wheezes. She has no rales. She exhibits no tenderness.  Abdominal: She exhibits no distension. There  is tenderness. There is no rebound.  Mild tenderness right lower quadrant and left lower quadrant  Musculoskeletal: Normal range of motion. She exhibits no edema.  Lymphadenopathy:    She has no cervical adenopathy.  Neurological: She is oriented to person, place, and time. She exhibits normal muscle tone. Coordination normal.  Skin: No rash noted. No erythema.  Psychiatric: She has a normal mood and affect. Her behavior is normal.     ED Treatments / Results  Labs (all labs ordered are listed, but only abnormal results are displayed) Labs Reviewed  CBC WITH DIFFERENTIAL/PLATELET - Abnormal; Notable for the following components:      Result Value   Hemoglobin 11.4 (*)    HCT 35.2 (*)    MCV 75.9 (*)    MCH 24.6 (*)    All other components within normal limits  COMPREHENSIVE METABOLIC  PANEL - Abnormal; Notable for the following components:   CO2 21 (*)    Glucose, Bld 199 (*)    Calcium 8.6 (*)    Albumin 3.1 (*)    All other components within normal limits  URINALYSIS, ROUTINE W REFLEX MICROSCOPIC    EKG  EKG Interpretation None       Radiology US Pelvis Transvanginal Non-ob (tv Only)  Result Date: 05/21/2017 CLINICAL DATA:  42 year old female with pelvic pain. History of hysterectomy. EXAM: TRANSABDOMINAL AND TRANSVAGINAL ULTRASOUND OF PELVIS DOPPLER ULTRASOUND OF OVARIES TECHNIQUE: Both transabdominal and transvaginal ultrasound examinations of the pelvis were performed. Transabdominal technique was performed for global imaging of the pelvis including ovaries, adnexal regions, and pelvic cul-de-sac. It was necessary to proceed with endovaginal exam following the transabdominal exam to visualize the adnexal regions. Color and duplex Doppler ultrasound was utilized to evaluate blood flow to the ovaries. COMPARISON:  None. FINDINGS: Uterus Not visualized compatible with hysterectomy. Right ovary Not visualized on transabdominal nor transvaginal evaluation. Left ovary  Measurements: 3 x 2.5 x 2.5 cm. A 2 cm left ovarian cyst/follicle is noted. No other significant abnormalities noted. Pulsed Doppler evaluation of the left ovary demonstrates normal low-resistance arterial and venous waveforms. Other findings No abnormal free fluid. IMPRESSION: 1. Unremarkable left ovary.  No evidence of left ovarian torsion. 2. Right ovary could not be visualized on either transabdominal or transvaginal scans. 3. Status post hysterectomy Electronically Signed   By: Margarette Canada M.D.   On: 05/21/2017 15:41   US Pelvis Complete  Result Date: 05/21/2017 CLINICAL DATA:  42 year old female with pelvic pain. History of hysterectomy. EXAM: TRANSABDOMINAL AND TRANSVAGINAL ULTRASOUND OF PELVIS DOPPLER ULTRASOUND OF OVARIES TECHNIQUE: Both transabdominal and transvaginal ultrasound examinations of the pelvis were performed. Transabdominal technique was performed for global imaging of the pelvis including ovaries, adnexal regions, and pelvic cul-de-sac. It was necessary to proceed with endovaginal exam following the transabdominal exam to visualize the adnexal regions. Color and duplex Doppler ultrasound was utilized to evaluate blood flow to the ovaries. COMPARISON:  None. FINDINGS: Uterus Not visualized compatible with hysterectomy. Right ovary Not visualized on transabdominal nor transvaginal evaluation. Left ovary Measurements: 3 x 2.5 x 2.5 cm. A 2 cm left ovarian cyst/follicle is noted. No other significant abnormalities noted. Pulsed Doppler evaluation of the left ovary demonstrates normal low-resistance arterial and venous waveforms. Other findings No abnormal free fluid. IMPRESSION: 1. Unremarkable left ovary.  No evidence of left ovarian torsion. 2. Right ovary could not be visualized on either transabdominal or transvaginal scans. 3. Status post hysterectomy Electronically Signed   By: Margarette Canada M.D.   On: 05/21/2017 15:41   US Pelvic Doppler (torsion R/o Or Mass Arterial Flow)  Result  Date: 05/21/2017 CLINICAL DATA:  42 year old female with pelvic pain. History of hysterectomy. EXAM: TRANSABDOMINAL AND TRANSVAGINAL ULTRASOUND OF PELVIS DOPPLER ULTRASOUND OF OVARIES TECHNIQUE: Both transabdominal and transvaginal ultrasound examinations of the pelvis were performed. Transabdominal technique was performed for global imaging of the pelvis including ovaries, adnexal regions, and pelvic cul-de-sac. It was necessary to proceed with endovaginal exam following the transabdominal exam to visualize the adnexal regions. Color and duplex Doppler ultrasound was utilized to evaluate blood flow to the ovaries. COMPARISON:  None. FINDINGS: Uterus Not visualized compatible with hysterectomy. Right ovary Not visualized on transabdominal nor transvaginal evaluation. Left ovary Measurements: 3 x 2.5 x 2.5 cm. A 2 cm left ovarian cyst/follicle is noted. No other significant abnormalities noted. Pulsed Doppler evaluation of the left ovary  demonstrates normal low-resistance arterial and venous waveforms. Other findings No abnormal free fluid. IMPRESSION: 1. Unremarkable left ovary.  No evidence of left ovarian torsion. 2. Right ovary could not be visualized on either transabdominal or transvaginal scans. 3. Status post hysterectomy Electronically Signed   By: Margarette Canada M.D.   On: 05/21/2017 15:41    Procedures Procedures (including critical care time)  Medications Ordered in ED Medications  HYDROmorphone (DILAUDID) injection 1 mg (1 mg Intravenous Given 05/21/17 1253)  ondansetron (ZOFRAN) injection 4 mg (4 mg Intravenous Given 05/21/17 1253)     Initial Impression / Assessment and Plan / ED Course  I have reviewed the triage vital signs and the nursing notes.  Pertinent labs & imaging results that were available during my care of the patient were reviewed by me and considered in my medical decision making (see chart for details).    Patient with pelvic pain.  Ultrasound showed left ovary to be  unremarkable with the right ovary was not seen.  Patient will be put on Ultram and asked to follow-up with the women's clinic  Final Clinical Impressions(s) / ED Diagnoses   Final diagnoses:  Pelvic pain    ED Discharge Orders        Ordered    traMADol (ULTRAM) 50 MG tablet  Every 6 hours PRN     05/21/17 1601       Milton Ferguson, MD 05/21/17 1604

## 2017-05-21 NOTE — Discharge Instructions (Signed)
Follow-up at the women's clinic later this week or next week for recheck

## 2017-05-21 NOTE — ED Notes (Signed)
Patient transported to Ultrasound 

## 2017-05-21 NOTE — ED Triage Notes (Addendum)
Pt has no uterus and states she has both ovaries and she has bilateral pain and states before she had internal hemorrhaging before.  Has been fixed surgery.  Pt is having significant pain.  Pt states she has had ovarian torsion and had to have it fixed emergently in the past.

## 2017-06-25 ENCOUNTER — Encounter: Payer: Self-pay | Admitting: *Deleted

## 2017-10-14 ENCOUNTER — Emergency Department (HOSPITAL_COMMUNITY): Payer: Self-pay

## 2017-10-14 ENCOUNTER — Other Ambulatory Visit: Payer: Self-pay

## 2017-10-14 ENCOUNTER — Encounter (HOSPITAL_COMMUNITY): Payer: Self-pay | Admitting: Radiology

## 2017-10-14 ENCOUNTER — Observation Stay (HOSPITAL_COMMUNITY)
Admission: EM | Admit: 2017-10-14 | Discharge: 2017-10-15 | Disposition: A | Discharge status: Home / Self Care | Payer: Self-pay | Attending: Emergency Medicine | Admitting: Emergency Medicine

## 2017-10-14 DIAGNOSIS — R42 Dizziness and giddiness: Secondary | ICD-10-CM

## 2017-10-14 DIAGNOSIS — R55 Syncope and collapse: Secondary | ICD-10-CM | POA: Insufficient documentation

## 2017-10-14 DIAGNOSIS — E1165 Type 2 diabetes mellitus with hyperglycemia: Secondary | ICD-10-CM

## 2017-10-14 DIAGNOSIS — G459 Transient cerebral ischemic attack, unspecified: Principal | ICD-10-CM

## 2017-10-14 DIAGNOSIS — J45909 Unspecified asthma, uncomplicated: Secondary | ICD-10-CM | POA: Insufficient documentation

## 2017-10-14 DIAGNOSIS — Z79899 Other long term (current) drug therapy: Secondary | ICD-10-CM | POA: Insufficient documentation

## 2017-10-14 DIAGNOSIS — O24112 Pre-existing diabetes mellitus, type 2, in pregnancy, second trimester: Secondary | ICD-10-CM

## 2017-10-14 DIAGNOSIS — E0590 Thyrotoxicosis, unspecified without thyrotoxic crisis or storm: Secondary | ICD-10-CM | POA: Insufficient documentation

## 2017-10-14 DIAGNOSIS — I10 Essential (primary) hypertension: Secondary | ICD-10-CM | POA: Insufficient documentation

## 2017-10-14 DIAGNOSIS — E1110 Type 2 diabetes mellitus with ketoacidosis without coma: Secondary | ICD-10-CM | POA: Insufficient documentation

## 2017-10-14 DIAGNOSIS — Z7984 Long term (current) use of oral hypoglycemic drugs: Secondary | ICD-10-CM | POA: Insufficient documentation

## 2017-10-14 DIAGNOSIS — E1142 Type 2 diabetes mellitus with diabetic polyneuropathy: Secondary | ICD-10-CM

## 2017-10-14 HISTORY — DX: Unspecified maternal hypertension, unspecified trimester: O16.9

## 2017-10-14 HISTORY — DX: Cough: R05

## 2017-10-14 HISTORY — DX: Tachycardia, unspecified: R00.0

## 2017-10-14 HISTORY — DX: Pre-existing type 2 diabetes mellitus, in pregnancy, second trimester: O24.112

## 2017-10-14 HISTORY — DX: Complete placenta previa nos or without hemorrhage, unspecified trimester: O44.00

## 2017-10-14 HISTORY — DX: Supervision of elderly multigravida, unspecified trimester: O09.529

## 2017-10-14 HISTORY — DX: Supervision of high risk pregnancy, unspecified, unspecified trimester: O09.90

## 2017-10-14 LAB — COMPREHENSIVE METABOLIC PANEL
ALT: 16 U/L (ref 14–54)
ANION GAP: 8 (ref 5–15)
AST: 15 U/L (ref 15–41)
Albumin: 3.2 g/dL — ABNORMAL LOW (ref 3.5–5.0)
Alkaline Phosphatase: 110 U/L (ref 38–126)
BUN: 11 mg/dL (ref 6–20)
CALCIUM: 9.2 mg/dL (ref 8.9–10.3)
CO2: 25 mmol/L (ref 22–32)
Chloride: 105 mmol/L (ref 101–111)
Creatinine, Ser: 0.51 mg/dL (ref 0.44–1.00)
GLUCOSE: 230 mg/dL — AB (ref 65–99)
Potassium: 3.5 mmol/L (ref 3.5–5.1)
SODIUM: 138 mmol/L (ref 135–145)
TOTAL PROTEIN: 7.3 g/dL (ref 6.5–8.1)
Total Bilirubin: 0.7 mg/dL (ref 0.3–1.2)

## 2017-10-14 LAB — GLUCOSE, CAPILLARY
Glucose-Capillary: 114 mg/dL — ABNORMAL HIGH (ref 65–99)
Glucose-Capillary: 138 mg/dL — ABNORMAL HIGH (ref 65–99)

## 2017-10-14 LAB — PROTIME-INR
INR: 1.08
PROTHROMBIN TIME: 13.9 s (ref 11.4–15.2)

## 2017-10-14 LAB — CBG MONITORING, ED: Glucose-Capillary: 116 mg/dL — ABNORMAL HIGH (ref 65–99)

## 2017-10-14 LAB — I-STAT BETA HCG BLOOD, ED (MC, WL, AP ONLY): I-stat hCG, quantitative: <5 m[IU]/mL (ref <5)

## 2017-10-14 LAB — DIFFERENTIAL
Abs Immature Granulocytes: 0 10*3/uL (ref 0.0–0.1)
BASOS PCT: 0 %
Basophils Absolute: 0 10*3/uL (ref 0.0–0.1)
EOS ABS: 0.2 10*3/uL (ref 0.0–0.7)
EOS PCT: 2 %
Immature Granulocytes: 0 %
Lymphocytes Relative: 24 %
Lymphs Abs: 2.5 10*3/uL (ref 0.7–4.0)
MONO ABS: 0.6 10*3/uL (ref 0.1–1.0)
Monocytes Relative: 6 %
NEUTROS PCT: 68 %
Neutro Abs: 7 10*3/uL (ref 1.7–7.7)

## 2017-10-14 LAB — HEMOGLOBIN A1C
HEMOGLOBIN A1C: 6.9 % — AB (ref 4.8–5.6)
MEAN PLASMA GLUCOSE: 151.33 mg/dL

## 2017-10-14 LAB — CBC
HCT: 37.7 % (ref 36.0–46.0)
HEMOGLOBIN: 11.7 g/dL — AB (ref 12.0–15.0)
MCH: 23.8 pg — ABNORMAL LOW (ref 26.0–34.0)
MCHC: 31 g/dL (ref 30.0–36.0)
MCV: 76.8 fL — ABNORMAL LOW (ref 78.0–100.0)
PLATELETS: 266 10*3/uL (ref 150–400)
RBC: 4.91 MIL/uL (ref 3.87–5.11)
RDW: 13.9 % (ref 11.5–15.5)
WBC: 10.4 10*3/uL (ref 4.0–10.5)

## 2017-10-14 LAB — I-STAT CHEM 8, ED
BUN: 10 mg/dL (ref 6–20)
CALCIUM ION: 1.21 mmol/L (ref 1.15–1.40)
CHLORIDE: 102 mmol/L (ref 101–111)
CREATININE: 0.4 mg/dL — AB (ref 0.44–1.00)
GLUCOSE: 226 mg/dL — AB (ref 65–99)
HCT: 35 % — ABNORMAL LOW (ref 36.0–46.0)
Hemoglobin: 11.9 g/dL — ABNORMAL LOW (ref 12.0–15.0)
POTASSIUM: 3.4 mmol/L — AB (ref 3.5–5.1)
Sodium: 140 mmol/L (ref 135–145)
TCO2: 25 mmol/L (ref 22–32)

## 2017-10-14 LAB — LIPID PANEL
Cholesterol: 123 mg/dL (ref 0–200)
HDL: 34 mg/dL — AB (ref >40)
LDL Cholesterol: 72 mg/dL (ref 0–99)
Total CHOL/HDL Ratio: 3.6 RATIO
Triglycerides: 87 mg/dL (ref <150)
VLDL: 17 mg/dL (ref 0–40)

## 2017-10-14 LAB — APTT: APTT: 32 s (ref 24–36)

## 2017-10-14 LAB — I-STAT TROPONIN, ED: Troponin i, poc: 0.01 ng/mL (ref 0.00–0.08)

## 2017-10-14 MED ORDER — INSULIN ASPART 100 UNIT/ML ~~LOC~~ SOLN: 0.0000 [IU] | Freq: Three times a day (TID) | SUBCUTANEOUS | Status: DC

## 2017-10-14 MED ORDER — ACETAMINOPHEN 650 MG RE SUPP: 650.0000 mg | Freq: Four times a day (QID) | RECTAL | Status: DC | PRN

## 2017-10-14 MED ORDER — MECLIZINE HCL 25 MG PO TABS
25.0000 mg | ORAL_TABLET | Freq: Once | ORAL | Status: AC
  Administered 2017-10-14: 25 mg via ORAL
  Filled 2017-10-14: qty 1

## 2017-10-14 MED ORDER — INSULIN ASPART 100 UNIT/ML ~~LOC~~ SOLN: 0.0000 [IU] | Freq: Every day | SUBCUTANEOUS | Status: DC

## 2017-10-14 MED ORDER — ASPIRIN 81 MG PO CHEW
81.0000 mg | CHEWABLE_TABLET | Freq: Every day | ORAL | Status: DC
  Administered 2017-10-15: 81 mg via ORAL
  Filled 2017-10-14: qty 1

## 2017-10-14 MED ORDER — IOPAMIDOL (ISOVUE-370) INJECTION 76%
50.0000 mL | Freq: Once | INTRAVENOUS | Status: AC | PRN
  Administered 2017-10-14: 50 mL via INTRAVENOUS

## 2017-10-14 MED ORDER — ACETAMINOPHEN 500 MG PO TABS
1000.0000 mg | ORAL_TABLET | Freq: Once | ORAL | Status: AC
  Administered 2017-10-14: 1000 mg via ORAL
  Filled 2017-10-14: qty 2

## 2017-10-14 MED ORDER — GABAPENTIN 300 MG PO CAPS
300.0000 mg | ORAL_CAPSULE | Freq: Every day | ORAL | Status: DC
  Administered 2017-10-14: 300 mg via ORAL
  Filled 2017-10-14: qty 1

## 2017-10-14 MED ORDER — ACETAMINOPHEN 325 MG PO TABS: 650.0000 mg | ORAL_TABLET | Freq: Four times a day (QID) | ORAL | Status: DC | PRN

## 2017-10-14 MED ORDER — FAMOTIDINE 20 MG PO TABS
20.0000 mg | ORAL_TABLET | Freq: Two times a day (BID) | ORAL | Status: DC
  Administered 2017-10-14 – 2017-10-15 (×2): 20 mg via ORAL
  Filled 2017-10-14: qty 2
  Filled 2017-10-14: qty 1

## 2017-10-14 MED ORDER — ENOXAPARIN SODIUM 40 MG/0.4ML ~~LOC~~ SOLN
40.0000 mg | SUBCUTANEOUS | Status: DC
  Administered 2017-10-14: 40 mg via SUBCUTANEOUS
  Filled 2017-10-14: qty 0.4

## 2017-10-14 MED ORDER — IOPAMIDOL (ISOVUE-370) INJECTION 76%
INTRAVENOUS | Status: AC
  Filled 2017-10-14: qty 50

## 2017-10-14 MED ORDER — ASPIRIN 325 MG PO TABS
325.0000 mg | ORAL_TABLET | Freq: Every day | ORAL | Status: DC
  Administered 2017-10-14: 325 mg via ORAL
  Filled 2017-10-14: qty 1

## 2017-10-14 NOTE — Consult Note (Addendum)
Referring Physician: ER Consult Reason: Code Stroke Chief Complaint: "dizzy; left hand tingles"  HPI: Lisa Moreno is an 42 y.o. female who has HTN, DM2, Asthma, GERD presents to ER via POV. While sitting in church today pt states she suddenly felt sleepy, dizzy and had blurred vision. When she got up to go to the bathroom she fell into someone along the way. There was no fall to ground or injury, she simply describes not "walking right". She and husband state this started at 1100. Upon exam in CT scanner, she becomes very dizzy upon sitting/standing. She is able to ambulate few feet and gait is not currently ataxic. She has no weakness or focal deficits. She describes left hand only as "ants crawling" in tingling sensation, no sensory deficit. Emergent evaluation of CTH showed no bleed and no acute changes. 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 No LVO. There are no CI to IV tPA and this was d/w pt and family given the presenting symptoms possibly being a posterior circulation stroke. However, this was declined by pt after further discussion and it was determined to not use IV tPA for remaining mild symptoms as well as diagnostic uncertainty. She will need admission for stroke/TIA wk up.  Date last known well: 10/13/17 Time last known well: 1100  tPA Given: No. Mild symptoms/symptoms resolved.  Past Medical History Past Medical History:  Diagnosis Date  . Asthma   . Diabetes mellitus without complication (HCC)   . GERD (gastroesophageal reflux disease)   . Gestational diabetes   . Hypertension     Surgical History Past Surgical History:  Procedure Laterality Date  . ABDOMINAL HYSTERECTOMY    . BREAST REDUCTION SURGERY    . ovarian torsion    . TONSILLECTOMY      Family History  Family History  Problem Relation Age of Onset  . Diabetes Mother   . Stroke Mother   . Hypertension Mother   .  Depression Mother   . Diabetes Father   . Stroke Father   . Kidney disease Father   . Hypertension Father     Social History:   reports that she has never smoked. She has never used smokeless tobacco. She reports that she does not drink alcohol or use drugs.  Allergies:  Allergies  Allergen Reactions  . Decadron [Dexamethasone] Other (See Comments)    Intense burning vaginal/rectal area.     Home Medications:   (Not in a hospital admission)  Hospital Medications . iopamidol      . meclizine  25 mg Oral Once    ROS:  History obtained from pt and husband  General ROS: negative for - chills, fatigue, fever, night sweats, weight gain or weight loss Psychological ROS: negative for - behavioral disorder, hallucinations, memory difficulties, mood swings or suicidal ideation Ophthalmic ROS: negative for double vision, eye pain or loss of vision; + blurry vision ENT ROS: negative for - epistaxis, nasal discharge, oral lesions, sore throat, tinnitus or vertigo Allergy and Immunology ROS: negative for - hives or itchy/watery eyes Hematological and Lymphatic ROS: negative for - bleeding problems, bruising or swollen lymph nodes Endocrine ROS: negative for - galactorrhea, hair pattern changes, polydipsia/polyuria or temperature intolerance Respiratory ROS: negative for - cough, hemoptysis, shortness of breath or wheezing Cardiovascular ROS: negative for - chest pain, dyspnea on exertion, edema or irregular heartbeat Gastrointestinal ROS: negative for - abdominal pain, diarrhea,  hematemesis, nausea/vomiting or stool incontinence Genito-Urinary ROS: negative for - dysuria, hematuria, incontinence or urinary frequency/urgency Musculoskeletal ROS: negative for - joint swelling or muscular weakness Neurological ROS: as noted in HPI Dermatological ROS: negative for rash and skin lesion changes   Physical Examination:  Vitals:   10/14/17 1139 10/14/17 1243 10/14/17 1245 10/14/17 1300  BP:  (!) 143/69 140/61 (!) 135/59 (!) 143/59  Pulse: 78 82 71 78  Resp: 18 (!) 22 20 19   Temp: 98 F (36.7 C) (!) 97.3 F (36.3 C)    TempSrc: Oral Temporal    SpO2: 97% 100% 99% 97%  Weight: 180 lb (81.6 kg)     Height: 5\' 4"  (1.626 m)       General - obese, mod distress Heart - Regular rate and rhythm - no murmer appreciated Lungs - Clear to auscultation  Abdomen - Soft - non tender Extremities - Distal pulses intact - no edema Skin - Warm and dry   Neurologic Examination:  Alert, oriented, thought content appropriate.  Speech fluent without evidence of aphasia. Able to follow 3 step commands without difficulty. Cranial Nerves: II: Discs not visualized; Visual fields grossly normal, pupils equal, round, reactive to light. No nystagmus. III,IV, VI: ptosis not present, extra-ocular motions intact bilaterally V,VII: smile symmetric, facial light touch sensation normal bilaterally VIII: hearing normal bilaterally IX,X: gag reflex present XI: bilateral shoulder shrug XII: midline tongue extension Motor: RUE - 5/5    LUE - 5/5   RLE - 5/5    LLE - 5/5 Tone and bulk:normal tone throughout; no atrophy noted Sensory: Light touch intact throughout, bilaterally; describes left hand as tingling  Deep Tendon Reflexes: 2+ and symmetric throughout Plantars: Right: downgoing   Left: downgoing Cerebellar: normal finger-to-nose and normal heel-to-shin test Gait: Walked forward/backwards with hesitancy, but not clearly ataxic.c/o dizziness when ambulating   NIHSS 1a Level of Conscious:0 1b LOC Questions: 0 1c LOC Commands:0  2 Best Gaze: 0 3 Visual: 0 4 Facial Palsy:0  5a Motor Arm - left:0  5b Motor Arm - Right:0  6a Motor Leg - Left: 0 6b Motor Leg - Right:0  7 Limb Ataxia: 0 8 Sensory: 0 9 Best Language:0  10 Dysarthria:0 11 Extinct. and Inattention:0 TOTAL: 0   LABORATORY STUDIES:  Basic Metabolic Panel: Recent Labs  Lab 10/14/17 1152 10/14/17 1200  NA 138 140  K  3.5 3.4*  CL 105 102  CO2 25  --   GLUCOSE 230* 226*  BUN 11 10  CREATININE 0.51 0.40*  CALCIUM 9.2  --     Liver Function Tests: Recent Labs  Lab 10/14/17 1152  AST 15  ALT 16  ALKPHOS 110  BILITOT 0.7  PROT 7.3  ALBUMIN 3.2*   No results for input(s): LIPASE, AMYLASE in the last 168 hours. No results for input(s): AMMONIA in the last 168 hours.  CBC: Recent Labs  Lab 10/14/17 1152 10/14/17 1200  WBC 10.4  --   NEUTROABS 7.0  --   HGB 11.7* 11.9*  HCT 37.7 35.0*  MCV 76.8*  --   PLT 266  --     Cardiac Enzymes: No results for input(s): CKTOTAL, CKMB, CKMBINDEX, TROPONINI in the last 168 hours.  BNP: Invalid input(s): POCBNP  CBG: No results for input(s): GLUCAP in the last 168 hours.  Microbiology:   Coagulation Studies: Recent Labs    10/14/17 1152  LABPROT 13.9  INR 1.08    Urinalysis: No results for input(s): COLORURINE, LABSPEC, PHURINE, GLUCOSEU,  HGBUR, BILIRUBINUR, KETONESUR, PROTEINUR, UROBILINOGEN, NITRITE, LEUKOCYTESUR in the last 168 hours.  Invalid input(s): APPERANCEUR  Lipid Panel:     Component Value Date/Time   CHOL 148 03/30/2014 1547   TRIG 101 03/30/2014 1547   HDL 38 (L) 03/30/2014 1547   CHOLHDL 3.9 03/30/2014 1547   VLDL 20 03/30/2014 1547   LDLCALC 90 03/30/2014 1547    HgbA1C:  Lab Results  Component Value Date   HGBA1C 5.5 06/16/2015    Urine Drug Screen:  No results found for: LABOPIA, COCAINSCRNUR, LABBENZ, AMPHETMU, THCU, LABBARB   Alcohol Level:  No results for input(s): ETH in the last 168 hours.  Miscellaneous labs:  EKG  EKG   IMAGING: Ct Angio Head W Or Wo Contrast  Result Date: 10/14/2017 CLINICAL DATA:  Dizziness, blurred vision, and unsteady gait. EXAM: CT ANGIOGRAPHY HEAD AND NECK TECHNIQUE: Multidetector CT imaging of the head and neck was performed using the standard protocol during bolus administration of intravenous contrast. Multiplanar CT image reconstructions and MIPs were obtained  to evaluate the vascular anatomy. Carotid stenosis measurements (when applicable) are obtained utilizing NASCET criteria, using the distal internal carotid diameter as the denominator. CONTRAST:  50mL ISOVUE-370 IOPAMIDOL (ISOVUE-370) INJECTION 76% COMPARISON:  Head CT 10/14/2017 and MRI 08/25/2014. No prior angiographic imaging. FINDINGS: CTA NECK FINDINGS Aortic arch: Standard 3 vessel aortic arch. Widely patent arch vessel origins. Widely patent brachiocephalic and subclavian arteries. Right carotid system: Patent without evidence of stenosis, dissection, or significant atherosclerosis. Mildly tortuous mid to distal cervical ICA. Left carotid system: Patent without evidence of stenosis, dissection, or significant atherosclerosis. Vertebral arteries: Patent without evidence of stenosis, dissection, or significant atherosclerosis. Dominant left vertebral artery. Skeleton: No acute osseous abnormality or suspicious osseous lesion. Other neck: Mild diffuse prominence of the thyroid gland without focal abnormality identified. Chronic prominence of the posterior nasopharyngeal soft tissues bilaterally with a calcification noted in the midline and with a suspected subcentimeter cyst in this location on the prior MRI. Mildly enlarged level IIa lymph nodes measuring up to 1.5 cm in short axis bilaterally. Upper chest: Small amount of homogeneous low-density soft tissue anteriorly in the mediastinum, likely thymus. Clear lung apices. Review of the MIP images confirms the above findings CTA HEAD FINDINGS Anterior circulation: The internal carotid arteries are widely patent from skull base to carotid termini. ACAs and MCAs are patent without evidence of proximal branch occlusion or significant proximal stenosis. No aneurysm is identified. Posterior circulation: Intracranial vertebral arteries are patent to the basilar with the left being dominant. The right vertebral artery is hypoplastic distal to the PICA origin. Patent  PICA and SCA origins are identified bilaterally. The left AICA is dominant. The basilar artery is widely patent. The PCAs are patent without evidence of significant proximal stenosis. There is a fetal origin of the left PCA. No aneurysm is identified. Venous sinuses: Patent. Anatomic variants: Fetal origin of the left PCA. Review of the MIP images confirms the above findings IMPRESSION: 1. No emergent large vessel occlusion or stenosis. 2. Mild bilateral level II lymphadenopathy, nonspecific though may be reactive. No primary neck mass evident. These results were communicated to Dr. Laurence Slate at 12:32 pm on 10/14/2017 by text page via the [facility] messaging system. Electronically Signed   By: Sebastian Ache M.D.   On: 10/14/2017 13:14   Ct Angio Neck W Or Wo Contrast  Result Date: 10/14/2017 CLINICAL DATA:  Dizziness, blurred vision, and unsteady gait. EXAM: CT ANGIOGRAPHY HEAD AND NECK TECHNIQUE: Multidetector CT imaging of  the head and neck was performed using the standard protocol during bolus administration of intravenous contrast. Multiplanar CT image reconstructions and MIPs were obtained to evaluate the vascular anatomy. Carotid stenosis measurements (when applicable) are obtained utilizing NASCET criteria, using the distal internal carotid diameter as the denominator. CONTRAST:  50mL ISOVUE-370 IOPAMIDOL (ISOVUE-370) INJECTION 76% COMPARISON:  Head CT 10/14/2017 and MRI 08/25/2014. No prior angiographic imaging. FINDINGS: CTA NECK FINDINGS Aortic arch: Standard 3 vessel aortic arch. Widely patent arch vessel origins. Widely patent brachiocephalic and subclavian arteries. Right carotid system: Patent without evidence of stenosis, dissection, or significant atherosclerosis. Mildly tortuous mid to distal cervical ICA. Left carotid system: Patent without evidence of stenosis, dissection, or significant atherosclerosis. Vertebral arteries: Patent without evidence of stenosis, dissection, or significant atherosclerosis.  Dominant left vertebral artery. Skeleton: No acute osseous abnormality or suspicious osseous lesion. Other neck: Mild diffuse prominence of the thyroid gland without focal abnormality identified. Chronic prominence of the posterior nasopharyngeal soft tissues bilaterally with a calcification noted in the midline and with a suspected subcentimeter cyst in this location on the prior MRI. Mildly enlarged level IIa lymph nodes measuring up to 1.5 cm in short axis bilaterally. Upper chest: Small amount of homogeneous low-density soft tissue anteriorly in the mediastinum, likely thymus. Clear lung apices. Review of the MIP images confirms the above findings CTA HEAD FINDINGS Anterior circulation: The internal carotid arteries are widely patent from skull base to carotid termini. ACAs and MCAs are patent without evidence of proximal branch occlusion or significant proximal stenosis. No aneurysm is identified. Posterior circulation: Intracranial vertebral arteries are patent to the basilar with the left being dominant. The right vertebral artery is hypoplastic distal to the PICA origin. Patent PICA and SCA origins are identified bilaterally. The left AICA is dominant. The basilar artery is widely patent. The PCAs are patent without evidence of significant proximal stenosis. There is a fetal origin of the left PCA. No aneurysm is identified. Venous sinuses: Patent. Anatomic variants: Fetal origin of the left PCA. Review of the MIP images confirms the above findings IMPRESSION: 1. No emergent large vessel occlusion or stenosis. 2. Mild bilateral level II lymphadenopathy, nonspecific though may be reactive. No primary neck mass evident. These results were communicated to Dr. Laurence Slate at 12:32 pm on 10/14/2017 by text page via the [facility] messaging system. Electronically Signed   By: Sebastian Ache M.D.   On: 10/14/2017 13:14   Ct Head Code Stroke Wo Contrast  Result Date: 10/14/2017 CLINICAL DATA:  Code stroke.  Ataxia.   Dizziness.  Blurred vision. EXAM: CT HEAD WITHOUT CONTRAST TECHNIQUE: Contiguous axial images were obtained from the base of the skull through the vertex without intravenous contrast. COMPARISON:  08/25/2014 brain MRI FINDINGS: Brain: There is no evidence of acute infarct, intracranial hemorrhage, mass, midline shift, or extra-axial fluid collection. The ventricles and sulci are normal. Vascular: No hyperdense vessel. Skull: No fracture or focal osseous lesion. Sinuses/Orbits: Large left maxillary sinus fluid level. Mild mucosal thickening scattered elsewhere in the paranasal sinuses. Clear mastoid air cells. Unremarkable orbits. Other: None. ASPECTS [facility] Stroke Program Early CT Score) Not scored given posterior circulation symptoms. IMPRESSION: 1. Unremarkable CT appearance of the brain. 2. Left maxillary sinusitis. These results were communicated to Dr. Laurence Slate at 12:16 pm on 10/14/2017 by text page via the [facility] messaging system. Electronically Signed   By: Sebastian Ache M.D.   On: 10/14/2017 12:17      Assessment: 42 y.o. female with sudden onset blurred vision, dizziness,  sleepy. Largely resolved at time of evaluation. Current c/o left hand tingling and dizziness upon standing Stroke Risk Factors - strong family history as both parents died of stroke, obesity, HTN, DM2  # Transient neurologic symptoms- ddx TIA, stroke, BPPV # Dizziness, blurred vision, sleepy- now resolved except for dizziness upon standing. This is c/w posterior circulation deficit. CTA reviewed and shows hypoplastic R vert, but no stenosis.  # HTN-permissive for now till further eval w/MRI # GERD- pepcid prn # DM2 w/hyperglycemia- A1c pending. SSI while here.    Plan:  HgbA1c, fasting lipid panel  MRI brain  PT consult, OT consult, Speech consult not needed. She is clear to swallow upon my eval  Echocardiogram  ASA 325mg  now PO, then daily  Allow for permissive hypertension for the first 24-48h - only treat PRN if  SBP >220 mmHg. Blood pressures can be gradually normalized to SBP<140 upon discharge  Statin Therapy if LDL not at goal < 70  Risk factor modification  Telemetry monitoring  Frequent neuro checks  Fall Precautions  DVT prophelaxis  Discussed with Dr Laurence Slate. Attending Neurologist's note to follow    NEUROHOSPITALIST ADDENDUM Seen and examined the patient today.   History is concerning for a posterior circulation stroke.  Patient has plenty of vascular risk factors and complaint of sudden onset feeling of sleepiness, loss of vision in both eyes for a few seconds and then when she went to get up had gait imbalance and fell on some.  Symptoms started while she was sitting down in church.    No nystagmus was seen.  Cover-uncover test was also negative.On examination she definitely had a unsteady gait.  We consider TPA despite NIHSS of 0, gait seemed to improve on repeat exam.  Patient was able to stand without losing balance for greater than 10 seconds and could walk by holding one hand.  For this reason we decided not to give TPA.  Admit for TIA stroke work-up.  Patient became quite hysterical when he told this could be a stroke as close family member died from a stroke.   I have reviewed the contents of history and physical exam as documented by PA/ARNP/Resident and agree with above documentation.  I have discussed and formulated the above plan as documented. Edits to the note have been made as needed.    Georgiana Spinner Aroor MD [facility] [phone]   If 7pm to 7am, please call on call as listed on AMION.

## 2017-10-14 NOTE — ED Provider Notes (Signed)
Patient placed in Quick Look pathway, seen and evaluated   Chief Complaint: Dizziness  HPI:   Presents with complaint of dizziness that began suddenly this morning while sitting in church.  Last normal 11 AM this morning.  Patient describes the sensation as a "waviness" and as if she is moving.  Accompanied by sensation of extreme fatigue, sleepiness, and numbness in the left hand.  Denies chest pain, shortness of breath, syncope, vomiting, headache, vision loss.  ROS: Dizziness  Physical Exam:   Gen: No distress  Neuro: Awake and Alert  Skin: Warm    Focused Exam:   No diaphoresis.  No pallor.  Pulmonary: No increased work of breathing.  Speaks in full sentences without difficulty.  No tachypnea.  Cardiac: Normal rate and regular. Peripheral pulses intact.  Neurologic:  Positive Romberg.  Patient unable to ambulate due to unsteadiness. Sensation grossly intact in the extremities. No noted speech deficits. No aphasia. Patient handles oral secretions without difficulty. No noted swallowing defects. Equal grip strength bilaterally. No arm drift. Strength 5/5 in the upper extremities. Strength 5/5 in lower extremities. Finger to nose testing normal. No facial droop.   Sudden onset neurologic deficit with last seen normal within the required time period. Code stroke activated.  Initiation of care has begun. The patient has been counseled on the process, plan, and necessity for staying for the completion/evaluation, and the remainder of the medical screening examination   Concepcion LivingJoy, Shawn C, PA-C 10/14/17 1200    Benjiman CorePickering, Nathan, MD 10/14/17 1530

## 2017-10-14 NOTE — ED Notes (Signed)
Patient transported to MRI 

## 2017-10-14 NOTE — ED Triage Notes (Signed)
Patient to ED from church c/o sudden onset sleepiness, "like I took a sleeping pill," and dizziness. Patient states she feels like she's "in the ocean riding waves." Dizziness worse with movement, patient very unsteady on feet. Also endorsing some tingling to both hands, more in the left hand, and blurred vision, but can't determine if in one eye or both. Patient A&O x 4, no facial droop, no speech changes. Grip strength equal and strong.

## 2017-10-14 NOTE — ED Notes (Signed)
Report called  

## 2017-10-14 NOTE — ED Provider Notes (Signed)
[facility] EMERGENCY DEPARTMENT Provider Note   CSN: [id number] Arrival date & time: 10/14/17  1134   An emergency department physician performed an initial assessment on this suspected stroke patient at 1153.  History   Chief Complaint Chief Complaint  Patient presents with  . Code Stroke    HPI Lisa Moreno is a 42 y.o. female.  Pt presents to the ED today with a sudden onset of sleepiness and dizziness.  She said her whole body feels heavy. The pt said sx started while she was at church, around 11:15.  She denies weakness worse on one side of the body, but did have some left sided numbness.  She has never had anything like this happen in the past.  Due to language barrier, an video interpreter was present during the history-taking and subsequent discussion (and for part of the physical exam) with this patient.  A code stroke was called and neurology met pt in the CT scanner.     Past Medical History:  Diagnosis Date  . Asthma   . Diabetes mellitus without complication ([place])   . GERD (gastroesophageal reflux disease)   . Gestational diabetes   . Hypertension     Patient Active Problem List   Diagnosis Date Noted  . Microcytic anemia 06/16/2015  . Cough 06/16/2015  . SOB (shortness of breath) 05/05/2015  . PND (paroxysmal nocturnal dyspnea) 05/05/2015  . Fatigue 05/05/2015  . HTN (hypertension) 12/11/2014  . Placenta previa centralis 12/10/2014  . Abnormal findings on antenatal screening   . Advanced maternal age in multigravida   . Supervision of high risk pregnancy, antepartum 12/07/2014  . Pregnancy with type 2 diabetes mellitus in second trimester 11/09/2014  . Hypertension affecting pregnancy, antepartum 11/09/2014  . Rapid heart beat 11/09/2014  . Type 2 diabetes mellitus without complication ([place]) 44/06/4740  . Morbid obesity ([place]) 03/30/2014  . Fibromyalgia 01/08/2013    Past Surgical History:  Procedure Laterality  Date  . ABDOMINAL HYSTERECTOMY    . BREAST REDUCTION SURGERY    . ovarian torsion    . TONSILLECTOMY       OB History    Gravida  3   Para  2   Term  2   Preterm  0   AB  0   Living  2     SAB  0   TAB  0   Ectopic  0   Multiple  0   Live Births  1            Home Medications    Prior to Admission medications   Medication Sig Start Date End Date Taking? Authorizing Provider  ACCU-CHEK FASTCLIX LANCETS MISC 1 Units by Percutaneous route 4 (four) times daily. 02/18/15   Anyanwu, Sallyanne Havers, Lisa Moreno  albuterol (PROVENTIL HFA;VENTOLIN HFA) 108 (90 BASE) MCG/ACT inhaler Inhale 2 puffs into the lungs every 6 (six) hours as needed for wheezing. 12/07/14   Truett Mainland, DO  azithromycin (ZITHROMAX) 250 MG tablet Take 2 tablets by mouth on day one, then 1 tablet daily for 4 days 06/16/15   Willeen Niece, Lisa Moreno  Blood Glucose Monitoring Suppl (ACCU-CHEK NANO SMARTVIEW) w/Device KIT USE AS DIRECTED FOUR  TIMES  DAILY 06/08/15   Constant, Peggy, Lisa Moreno  FLUoxetine (PROZAC) 20 MG tablet Take 1 tablet (20 mg total) by mouth daily. 06/16/15   Willeen Niece, Lisa Moreno  gabapentin (NEURONTIN) 300 MG capsule Take 1 capsule (300 mg total) by mouth  at bedtime. 01/18/15   Caren Macadam, Lisa Moreno  glucose blood test strip Use as instructed to check blood glucose 4 times daily. 12/09/14   Donnamae Jude, Lisa Moreno  ibuprofen (ADVIL,MOTRIN) 800 MG tablet Take 1 tablet by mouth 2 (two) times daily. Reported on 05/21/2015 04/09/15   Provider, Historical, Lisa Moreno  magnesium citrate SOLN Take 296 mLs (1 Bottle total) by mouth once. 03/20/15   [place]ancon, York Ram, Lisa Moreno  metFORMIN (GLUCOPHAGE) 500 MG tablet Take 1 tablet (500 mg total) by mouth 2 (two) times daily with a meal. 06/16/15   Willeen Niece, Lisa Moreno  phenylephrine-shark liver oil-mineral oil-petrolatum (PREPARATION H) 0.25-3-14-71.9 % rectal ointment Place 1 application rectally 4 (four) times daily as needed for hemorrhoids. Reported on 05/21/2015    Provider, Historical,  Lisa Moreno  polyethylene glycol powder (GLYCOLAX/MIRALAX) powder Take 17-34 g by mouth daily as needed for moderate constipation. Reported on 05/21/2015    Provider, Historical, Lisa Moreno  ranitidine (ZANTAC) 150 MG tablet Take 1 tablet (150 mg total) by mouth 2 (two) times daily. 05/21/15   Constant, Peggy, Lisa Moreno  traMADol (ULTRAM) 50 MG tablet Take 1 tablet (50 mg total) by mouth every 6 (six) hours as needed. 05/21/17   Milton Ferguson, Lisa Moreno    Family History Family History  Problem Relation Age of Onset  . Diabetes Mother   . Stroke Mother   . Hypertension Mother   . Depression Mother   . Diabetes Father   . Stroke Father   . Kidney disease Father   . Hypertension Father     Social History Social History   Tobacco Use  . Smoking status: Never Smoker  . Smokeless tobacco: Never Used  Substance Use Topics  . Alcohol use: No  . Drug use: No     Allergies   Decadron [dexamethasone]   Review of Systems Review of Systems  Neurological: Positive for dizziness.  All other systems reviewed and are negative.    Physical Exam Updated Vital Signs BP 127/72   Pulse 72   Temp (!) 97.3 F (36.3 C) (Temporal)   Resp 17   Ht 5' 4"  (1.626 m)   Wt 81.6 kg (180 lb)   LMP 08/05/2014   SpO2 98%   BMI 30.90 kg/m   Physical Exam  Constitutional: She is oriented to person, place, and time. She appears well-developed and well-nourished.  HENT:  Head: Normocephalic and atraumatic.  Right Ear: External ear normal.  Left Ear: External ear normal.  Nose: Nose normal.  Mouth/Throat: Oropharynx is clear and moist.  Eyes: Pupils are equal, round, and reactive to light. Conjunctivae and EOM are normal.  Neck: Normal range of motion. Neck supple.  Cardiovascular: Normal rate, regular rhythm, normal heart sounds and intact distal pulses.  Pulmonary/Chest: Effort normal and breath sounds normal.  Abdominal: Soft. Bowel sounds are normal.  Musculoskeletal: Normal range of motion.  Neurological: She is  alert and oriented to person, place, and time.  Skin: Skin is warm. Capillary refill takes less than 2 seconds.  Psychiatric: She has a normal mood and affect. Her behavior is normal. Judgment and thought content normal.  Nursing note and vitals reviewed.    ED Treatments / Results  Labs (all labs ordered are listed, but only abnormal results are displayed) Labs Reviewed  CBC - Abnormal; Notable for the following components:      Result Value   Hemoglobin 11.7 (*)    MCV 76.8 (*)    MCH 23.8 (*)  All other components within normal limits  COMPREHENSIVE METABOLIC PANEL - Abnormal; Notable for the following components:   Glucose, Bld 230 (*)    Albumin 3.2 (*)    All other components within normal limits  HEMOGLOBIN A1C - Abnormal; Notable for the following components:   Hgb A1c MFr Bld 6.9 (*)    All other components within normal limits  LIPID PANEL - Abnormal; Notable for the following components:   HDL 34 (*)    All other components within normal limits  I-STAT CHEM 8, ED - Abnormal; Notable for the following components:   Potassium 3.4 (*)    Creatinine, Ser 0.40 (*)    Glucose, Bld 226 (*)    Hemoglobin 11.9 (*)    HCT 35.0 (*)    All other components within normal limits  PROTIME-INR  APTT  DIFFERENTIAL  I-STAT TROPONIN, ED  I-STAT BETA HCG BLOOD, ED ([facility], [facility], [facility] ONLY)  CBG MONITORING, ED    EKG None  Radiology Ct Angio Head W Or Wo Contrast  Result Date: 10/14/2017 CLINICAL DATA:  Dizziness, blurred vision, and unsteady gait. EXAM: CT ANGIOGRAPHY HEAD AND NECK TECHNIQUE: Multidetector CT imaging of the head and neck was performed using the standard protocol during bolus administration of intravenous contrast. Multiplanar CT image reconstructions and MIPs were obtained to evaluate the vascular anatomy. Carotid stenosis measurements (when applicable) are obtained utilizing NASCET criteria, using the distal internal carotid diameter as the denominator. CONTRAST:   76m ISOVUE-370 IOPAMIDOL (ISOVUE-370) INJECTION 76% COMPARISON:  Head CT 10/14/2017 and MRI 08/25/2014. No prior angiographic imaging. FINDINGS: CTA NECK FINDINGS Aortic arch: Standard 3 vessel aortic arch. Widely patent arch vessel origins. Widely patent brachiocephalic and subclavian arteries. Right carotid system: Patent without evidence of stenosis, dissection, or significant atherosclerosis. Mildly tortuous mid to distal cervical ICA. Left carotid system: Patent without evidence of stenosis, dissection, or significant atherosclerosis. Vertebral arteries: Patent without evidence of stenosis, dissection, or significant atherosclerosis. Dominant left vertebral artery. Skeleton: No acute osseous abnormality or suspicious osseous lesion. Other neck: Mild diffuse prominence of the thyroid gland without focal abnormality identified. Chronic prominence of the posterior nasopharyngeal soft tissues bilaterally with a calcification noted in the midline and with a suspected subcentimeter cyst in this location on the prior MRI. Mildly enlarged level IIa lymph nodes measuring up to 1.5 cm in short axis bilaterally. Upper chest: Small amount of homogeneous low-density soft tissue anteriorly in the mediastinum, likely thymus. Clear lung apices. Review of the MIP images confirms the above findings CTA HEAD FINDINGS Anterior circulation: The internal carotid arteries are widely patent from skull base to carotid termini. ACAs and MCAs are patent without evidence of proximal branch occlusion or significant proximal stenosis. No aneurysm is identified. Posterior circulation: Intracranial vertebral arteries are patent to the basilar with the left being dominant. The right vertebral artery is hypoplastic distal to the PICA origin. Patent PICA and SCA origins are identified bilaterally. The left AICA is dominant. The basilar artery is widely patent. The PCAs are patent without evidence of significant proximal stenosis. There is a  fetal origin of the left PCA. No aneurysm is identified. Venous sinuses: Patent. Anatomic variants: Fetal origin of the left PCA. Review of the MIP images confirms the above findings IMPRESSION: 1. No emergent large vessel occlusion or stenosis. 2. Mild bilateral level II lymphadenopathy, nonspecific though may be reactive. No primary neck mass evident. These results were communicated to Dr. ALorraine Laxat 12:32 pm on 10/14/2017 by text page via  the [employer] system. Electronically Signed   By: Logan Bores M.D.   On: 10/14/2017 13:14   Ct Angio Neck W Or Wo Contrast  Result Date: 10/14/2017 CLINICAL DATA:  Dizziness, blurred vision, and unsteady gait. EXAM: CT ANGIOGRAPHY HEAD AND NECK TECHNIQUE: Multidetector CT imaging of the head and neck was performed using the standard protocol during bolus administration of intravenous contrast. Multiplanar CT image reconstructions and MIPs were obtained to evaluate the vascular anatomy. Carotid stenosis measurements (when applicable) are obtained utilizing NASCET criteria, using the distal internal carotid diameter as the denominator. CONTRAST:  70m ISOVUE-370 IOPAMIDOL (ISOVUE-370) INJECTION 76% COMPARISON:  Head CT 10/14/2017 and MRI 08/25/2014. No prior angiographic imaging. FINDINGS: CTA NECK FINDINGS Aortic arch: Standard 3 vessel aortic arch. Widely patent arch vessel origins. Widely patent brachiocephalic and subclavian arteries. Right carotid system: Patent without evidence of stenosis, dissection, or significant atherosclerosis. Mildly tortuous mid to distal cervical ICA. Left carotid system: Patent without evidence of stenosis, dissection, or significant atherosclerosis. Vertebral arteries: Patent without evidence of stenosis, dissection, or significant atherosclerosis. Dominant left vertebral artery. Skeleton: No acute osseous abnormality or suspicious osseous lesion. Other neck: Mild diffuse prominence of the thyroid gland without focal abnormality  identified. Chronic prominence of the posterior nasopharyngeal soft tissues bilaterally with a calcification noted in the midline and with a suspected subcentimeter cyst in this location on the prior MRI. Mildly enlarged level IIa lymph nodes measuring up to 1.5 cm in short axis bilaterally. Upper chest: Small amount of homogeneous low-density soft tissue anteriorly in the mediastinum, likely thymus. Clear lung apices. Review of the MIP images confirms the above findings CTA HEAD FINDINGS Anterior circulation: The internal carotid arteries are widely patent from skull base to carotid termini. ACAs and MCAs are patent without evidence of proximal branch occlusion or significant proximal stenosis. No aneurysm is identified. Posterior circulation: Intracranial vertebral arteries are patent to the basilar with the left being dominant. The right vertebral artery is hypoplastic distal to the PICA origin. Patent PICA and SCA origins are identified bilaterally. The left AICA is dominant. The basilar artery is widely patent. The PCAs are patent without evidence of significant proximal stenosis. There is a fetal origin of the left PCA. No aneurysm is identified. Venous sinuses: Patent. Anatomic variants: Fetal origin of the left PCA. Review of the MIP images confirms the above findings IMPRESSION: 1. No emergent large vessel occlusion or stenosis. 2. Mild bilateral level II lymphadenopathy, nonspecific though may be reactive. No primary neck mass evident. These results were communicated to Dr. ALorraine Laxat 12:32 pm on 10/14/2017 by text page [facility]. Electronically Signed   By: ALogan BoresM.D.   On: 10/14/2017 13:14   Ct Head Code Stroke Wo Contrast  Result Date: 10/14/2017 CLINICAL DATA:  Code stroke.  Ataxia.  Dizziness.  Blurred vision. EXAM: CT HEAD WITHOUT CONTRAST TECHNIQUE: Contiguous axial images were obtained from the base of the skull through the vertex without intravenous contrast.  COMPARISON:  08/25/2014 brain MRI FINDINGS: Brain: There is no evidence of acute infarct, intracranial hemorrhage, mass, midline shift, or extra-axial fluid collection. The ventricles and sulci are normal. Vascular: No hyperdense vessel. Skull: No fracture or focal osseous lesion. Sinuses/Orbits: Large left maxillary sinus fluid level. Mild mucosal thickening scattered elsewhere in the paranasal sinuses. Clear mastoid air cells. Unremarkable orbits. Other: None. ASPECTS ([facility]) Not scored given posterior circulation symptoms. IMPRESSION: 1. Unremarkable CT appearance of the brain. 2. Left  maxillary sinusitis. These results were communicated to Dr. Lorraine Lax at 12:16 pm on 10/14/2017 by text page via the [facility] messaging system. Electronically Signed   By: Logan Bores M.D.   On: 10/14/2017 12:17    Procedures Procedures (including critical care time)  Medications Ordered in ED Medications  iopamidol (ISOVUE-370) 76 % injection (has no administration in time range)  aspirin tablet 325 mg (has no administration in time range)  iopamidol (ISOVUE-370) 76 % injection 50 mL (50 mLs Intravenous Contrast Given 10/14/17 1223)  meclizine (ANTIVERT) tablet 25 mg (25 mg Oral Given 10/14/17 1315)     Initial Impression / Assessment and Plan / ED Course  I have reviewed the triage vital signs and the nursing notes.  Pertinent labs & imaging results that were available during my care of the patient were reviewed by me and considered in my medical decision making (see chart for details).    Pt is feeling a little better.  Neurology recommends admission for stroke work up due to risk factors.  Pt d/w IMTS for admission.  Final Clinical Impressions(s) / ED Diagnoses   Final diagnoses:  TIA (transient ischemic attack)  Poorly controlled type 2 diabetes mellitus [facility])    ED Discharge Orders    None       Lisa Pence, Lisa Moreno 10/14/17 1417

## 2017-10-14 NOTE — ED Notes (Signed)
Dizziness is better after meclizine ,

## 2017-10-14 NOTE — H&P (Addendum)
Date: 10/14/2017               Patient Name:  Lisa Moreno MRN: [id number]  DOB: 07/10/1975 Age / Sex: 42 y.o., female   PCP: System, Pcp Not In         Medical Service: Internal Medicine Teaching Service         Attending Physician: Dr. Oswaldo Done, Marquita Palms, *    First Contact: Dr. Crista Elliot Pager: [phone]  Second Contact: Dr. Obie Dredge Pager: [phone]       After Hours (After 5p/  First Contact Pager: [phone]  weekends / holidays): Second Contact Pager: [phone]   Chief Complaint: dizziness and sleepiness   History of Present Illness: Lisa Moreno is a 42 yo F w/ a PMHx notable for HTN, insulin independent DMII with peripheral neuropathy, GERD and asthma who presents with sudden onset dizziness, blurred vision and "walking toward the right" at 1115 hours on 10/14/2017. She stated that she was sitting in church when she began to feel the dizziness and sleepy. She stood up to go to the bathroom as she was feeling unwell and stated that she ran into someone because she was "so off balance" which prompted her to sit. She was in her usual state of health earlier this week and earlier today. She relates the feeling to how it feels when she has taken a sleeping pill in the past. After sitting she started to feel as though she was rocking in the ocean. There was associated transient central visual field loss that lasted no more than 4 seconds as well as a generalized heaviness that seemed to be bilateral. Since the time of presentation her symptoms have improved with only the feeling of rocking in water remaining if she rapidly moves her eyes or head.   She denies recent symptoms of URI, sick contacts ever having felt this way before, fever, chills, nausea, vomiting, palpitations, myalgias, joint aches, dysuria, urinary frequency or chest pain.  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 EKG w/ NSR, CBC remarkable for persistent anemia of 11.7, CMP with K+ 3.5, Na 138, and Gluc 230 as wellas Cr 0.51. The stroke team initiated the standard evaluation and placed the appropriate orders for such prior to requesting general admission.   Meds:  She does not take any prescription medications. Started taking multivitamins called nutrifort from [place] 4 days ago.   Allergies: Allergies as of 10/14/2017 - Review Complete 10/14/2017  Allergen Reaction Noted  . Decadron [dexamethasone] Other (See Comments) 11/16/2014   Past Medical History:  Diagnosis Date  . Asthma   . Diabetes mellitus without complication ([facility])   . GERD (gastroesophageal reflux disease)   . Gestational diabetes   . Hypertension    Family History:  Mother - CVA x 61  Father - CVA x1   Social History:  Denied tobacco, EtOH, methamphetamine, marijuana, cocaine or opioid use  Review of Systems: A complete ROS was negative except as per HPI.   Physical Exam: Blood pressure (!) 129/48, pulse 78, temperature (!) 97.3 F (36.3 C), temperature source Temporal, resp. rate 20, height 5\' 4"  (1.626 m), weight 180 lb (81.6 kg), last menstrual period 08/05/2014, SpO2 99 %, unknown if currently breastfeeding. Physical Exam  Constitutional: She is oriented to person, place, and time. She appears well-developed and  well-nourished. No distress.  HENT:  Head: Normocephalic and atraumatic.  Eyes: Pupils are equal, round, and reactive to light. EOM are normal.  Neck: Thyromegaly present.  Cardiovascular: Normal rate and regular rhythm.  No murmur heard. Pulmonary/Chest: Effort normal and breath sounds normal. No stridor. No respiratory distress.  Abdominal: Soft. Bowel sounds are normal. She exhibits no distension. There is no tenderness.  Musculoskeletal: She exhibits no edema or tenderness.  Neurological: She is alert and oriented to person, place, and time.  She has normal strength. No cranial nerve deficit or sensory deficit. She exhibits normal muscle tone. She displays no seizure activity. Coordination normal.  Skin: Skin is warm. Capillary refill takes less than 2 seconds. She is not diaphoretic.  Psychiatric: She has a normal mood and affect.  Vitals reviewed.  EKG: personally reviewed my interpretation is NSR  CXR: personally reviewed my interpretation is n/a  Assessment & Plan by Problem: Active Problems:   Dizziness  Assessment:  Lisa Moreno is a 42 yo F w/ a PMHx notable for HTN, insulin independent DMII with peripheral neuropathy, GERD and asthma who presents with sudden onset dizziness, blurred vision and "walking toward the right" at 1115 hours on 10/14/2017. Her presentation is most concerning for a TIA, CVA, as well as vertigo. Initial evaluation failed to identify a source of her symptoms. TPA was considered but ultimately unused due to the improvement in her symptoms and following a discussion with the patient who opted against use. Code stroke was called and the standard evaluation was initiated.   Plan: Dizziness: Concerning for TIA, CVA or vertigo. No mass noted on CT or vascular lesion likely to contribute to such symptoms noted on CTA.  --MRI brain ordered --Echocardiogram ordered --A1c 6.9% --Lipid profile with total 123, HDL 34, LDL 72, consider statin, ASCVD 45110yr risk <5% --ASA 325 given in ED, ASA 81mg  continued daily --Admitted to tele --Q4 hour neuro checks, fall precautions ordered --PT/OT evaluations ordered  DMII, insulin independent: On Metformin at home. Will monitor while admitted with CBG's WC & QHS and hold metformin.  --Start SSI sensitive  HTN: Not currently on treatment at home. Has previously required treatment. Will appropriately defer BP treatment to an outpatient clinic if her BP remains <140's systolic. Neurology requesting permissive hypertension for 24-48 hours as well.     Diet; Carb modified Code: Full Fluids: N/a GI PPX: Famotidine 20mg   VTE PPX: Enoxaparin  Dispo: Admit patient to Observation with expected length of stay less than 2 midnights.  Signed: Lanelle BalHarbrecht, Ilea Hilton, MD 10/14/2017, 4:17 PM  Pager: Pager# [phone]

## 2017-10-14 NOTE — ED Notes (Signed)
Pt back from MRI . Pt unable to complete test , she states that she will have to be put to "sleep" for test

## 2017-10-14 NOTE — ED Notes (Signed)
Activated code stroke with carelinkSelena Batten- Moreno.

## 2017-10-14 NOTE — ED Notes (Signed)
Admitting MD at bedside.

## 2017-10-15 ENCOUNTER — Observation Stay (HOSPITAL_COMMUNITY): Payer: Self-pay

## 2017-10-15 ENCOUNTER — Observation Stay (HOSPITAL_BASED_OUTPATIENT_CLINIC_OR_DEPARTMENT_OTHER): Payer: Self-pay

## 2017-10-15 ENCOUNTER — Telehealth: Payer: Self-pay

## 2017-10-15 DIAGNOSIS — R42 Dizziness and giddiness: Secondary | ICD-10-CM

## 2017-10-15 DIAGNOSIS — R55 Syncope and collapse: Secondary | ICD-10-CM

## 2017-10-15 DIAGNOSIS — E1110 Type 2 diabetes mellitus with ketoacidosis without coma: Secondary | ICD-10-CM | POA: Insufficient documentation

## 2017-10-15 DIAGNOSIS — E0590 Thyrotoxicosis, unspecified without thyrotoxic crisis or storm: Secondary | ICD-10-CM

## 2017-10-15 DIAGNOSIS — G459 Transient cerebral ischemic attack, unspecified: Secondary | ICD-10-CM

## 2017-10-15 LAB — BASIC METABOLIC PANEL
Anion gap: 7 (ref 5–15)
BUN: 7 mg/dL (ref 6–20)
CALCIUM: 8.9 mg/dL (ref 8.9–10.3)
CO2: 26 mmol/L (ref 22–32)
CREATININE: 0.44 mg/dL (ref 0.44–1.00)
Chloride: 106 mmol/L (ref 101–111)
GFR calc non Af Amer: >60 mL/min (ref >60)
Glucose, Bld: 111 mg/dL — ABNORMAL HIGH (ref 65–99)
Potassium: 3.8 mmol/L (ref 3.5–5.1)
SODIUM: 139 mmol/L (ref 135–145)

## 2017-10-15 LAB — CBC
HCT: 35.3 % — ABNORMAL LOW (ref 36.0–46.0)
HEMOGLOBIN: 11.1 g/dL — AB (ref 12.0–15.0)
MCH: 23.8 pg — AB (ref 26.0–34.0)
MCHC: 31.4 g/dL (ref 30.0–36.0)
MCV: 75.8 fL — ABNORMAL LOW (ref 78.0–100.0)
Platelets: 291 10*3/uL (ref 150–400)
RBC: 4.66 MIL/uL (ref 3.87–5.11)
RDW: 14 % (ref 11.5–15.5)
WBC: 12.1 10*3/uL — AB (ref 4.0–10.5)

## 2017-10-15 LAB — HIV ANTIBODY (ROUTINE TESTING W REFLEX): HIV SCREEN 4TH GENERATION: NONREACTIVE

## 2017-10-15 LAB — GLUCOSE, CAPILLARY
GLUCOSE-CAPILLARY: 138 mg/dL — AB (ref 65–99)
Glucose-Capillary: 105 mg/dL — ABNORMAL HIGH (ref 65–99)
Glucose-Capillary: 102 mg/dL — ABNORMAL HIGH (ref 65–99)

## 2017-10-15 LAB — ECHOCARDIOGRAM COMPLETE
Height: 64 in
WEIGHTICAEL: 3555.58 [oz_av]

## 2017-10-15 LAB — TSH: TSH: <0.01 u[IU]/mL — ABNORMAL LOW (ref 0.350–4.500)

## 2017-10-15 LAB — T4, FREE: Free T4: 2.13 ng/dL — ABNORMAL HIGH (ref 0.82–1.77)

## 2017-10-15 MED ORDER — METHIMAZOLE 5 MG PO TABS
15.0000 mg | ORAL_TABLET | Freq: Every day | ORAL | Status: DC
  Administered 2017-10-15: 15 mg via ORAL
  Filled 2017-10-15: qty 1

## 2017-10-15 MED ORDER — ATENOLOL 25 MG PO TABS
12.5000 mg | ORAL_TABLET | Freq: Every day | ORAL | Status: DC
  Administered 2017-10-15: 12.5 mg via ORAL
  Filled 2017-10-15: qty 1

## 2017-10-15 MED ORDER — LORAZEPAM 2 MG/ML IJ SOLN
1.0000 mg | Freq: Once | INTRAMUSCULAR | Status: AC
  Administered 2017-10-15: 1 mg via INTRAVENOUS
  Filled 2017-10-15: qty 1

## 2017-10-15 MED ORDER — HALOPERIDOL LACTATE 5 MG/ML IJ SOLN
1.0000 mg | Freq: Once | INTRAMUSCULAR | Status: AC
  Administered 2017-10-15: 1 mg via INTRAVENOUS
  Filled 2017-10-15: qty 1

## 2017-10-15 MED ORDER — METFORMIN HCL 500 MG PO TABS: 500.0000 mg | ORAL_TABLET | Freq: Two times a day (BID) | ORAL | 0 refills | Status: DC

## 2017-10-15 MED ORDER — METHIMAZOLE 5 MG PO TABS: 15.0000 mg | ORAL_TABLET | Freq: Every day | ORAL | 0 refills | Status: DC

## 2017-10-15 MED ORDER — ATENOLOL 25 MG PO TABS: 12.5000 mg | ORAL_TABLET | Freq: Every day | ORAL | 0 refills | Status: DC

## 2017-10-15 MED ORDER — GABAPENTIN 300 MG PO CAPS
300.0000 mg | ORAL_CAPSULE | Freq: Every day | ORAL | 0 refills | Status: DC
Start: 2017-10-15 — End: 2017-11-08

## 2017-10-15 MED FILL — GABAPENTIN 300 MG CAPSULE: 300 | 30 days supply | Qty: 30 | Fill #0

## 2017-10-15 MED FILL — methIMAzole 5 MG TABS: 5 | 30 days supply | Qty: 90 | Fill #0

## 2017-10-15 MED FILL — metFORMIN HCL 500 MG TABS: 500 | 15 days supply | Qty: 30 | Fill #0

## 2017-10-15 MED FILL — ATENOLOL 25 MG TABLET: 25 | 30 days supply | Qty: 15 | Fill #0

## 2017-10-15 NOTE — Discharge Summary (Signed)
Name: Lisa Moreno MRN: [id number] DOB: 08/30/1975 42 y.o. PCP: System, Pcp Not In  Date of Admission: 10/14/2017 11:39 AM Date of Discharge: 10/15/2017 Attending Physician: Dr. Evette Doffing  Discharge Diagnosis: 1. Hyperthyroidism 2. Diabetes mellitus  Discharge Medications: Allergies as of 10/15/2017      Reactions   Decadron [dexamethasone] Other (See Comments)   Intense burning vaginal/rectal area.       Medication List    STOP taking these medications   ACCU-CHEK FASTCLIX LANCETS Misc   ACCU-CHEK NANO SMARTVIEW w/Device Kit   albuterol 108 (90 Base) MCG/ACT inhaler Commonly known as:  PROVENTIL HFA;VENTOLIN HFA   azithromycin 250 MG tablet Commonly known as:  ZITHROMAX   FLUoxetine 20 MG tablet Commonly known as:  PROZAC   glucose blood test strip   ibuprofen 800 MG tablet Commonly known as:  ADVIL,MOTRIN   magnesium citrate Soln   phenylephrine-shark liver oil-mineral oil-petrolatum 0.25-3-14-71.9 % rectal ointment Commonly known as:  PREPARATION H   polyethylene glycol powder powder Commonly known as:  GLYCOLAX/MIRALAX   ranitidine 150 MG tablet Commonly known as:  ZANTAC   traMADol 50 MG tablet Commonly known as:  ULTRAM     TAKE these medications   atenolol 25 MG tablet Commonly known as:  TENORMIN Take 0.5 tablets (12.5 mg total) by mouth daily.   gabapentin 300 MG capsule Commonly known as:  NEURONTIN Take 1 capsule (300 mg total) by mouth at bedtime.   metFORMIN 500 MG tablet Commonly known as:  GLUCOPHAGE Take 1 tablet (500 mg total) by mouth 2 (two) times daily with a meal.   methimazole 5 MG tablet Commonly known as:  TAPAZOLE Take 3 tablets (15 mg total) by mouth daily.       Disposition and follow-up:   [name] was discharged from [facility] in Stable condition.  At the hospital follow up visit please address:  1.  Hyperthyroidism with associated presyncope: Patient admitted  with concerns for stroke. Thought to be presyncope 2/2 hyperthyroidism.   2.  Labs / imaging needed at time of follow-up: n/a  3.  Pending labs/ test needing follow-up: Repeat TSH, T3 results  Follow-up Appointments:   Hospital Course by problem list: Hyperthyroidism: Lisa Moreno is a 42 yo F w/ a PMHx notable for HTN and DMII neither currently treated for lack of insurance and affordability of a primary care physician or medications who presented for acute onset dizziness.  Her symptoms were persistent for greater than 3 hours but progressively improved and had resolved overnight.  Initial evaluation with CT and CT angio of the brain and neck failed to reveal clear etiology without notable mass or structural abnormalities consistent with her presentation.  Stroke evaluation was initiated but tPA was appropriately not indicated. Her hemoglobin A1c is 6.9% with a lipid profile with total cholesterol of 123, HDL 39, LDL 72.  TSH revealed a level less than 0.010 with reflex T3 and free T4 ordered. MRI brain was normal without acute findings. Her presentation was felt to be secondary to hyperthyroidism with secondary presyncopal symptoms. The patient did not demonstrate nystagmus, her dizziness lasted longer than expected with vertigo and was without focal deficits. TIA was unlikely given her symptoms.   DMII: Blood glucose ranged from 138-102 during admission. A1c was 6.9%. Please assess for appropriateness of therapy.   Discharge Vitals:   BP (!) 122/57 (BP Location: Left Arm)   Pulse 78   Temp 98.4 F (  36.9 C) (Oral)   Resp 18   Ht 5' 4"  (1.626 m)   Wt 222 lb 3.6 oz (100.8 kg)   LMP 08/05/2014   SpO2 97%   BMI 38.14 kg/m   Pertinent Labs, Studies, and Procedures:  CBC Latest Ref Rng & Units 10/15/2017 10/14/2017 10/14/2017  WBC 4.0 - 10.5 K/uL 12.1(H) - 10.4  Hemoglobin 12.0 - 15.0 g/dL 11.1(L) 11.9(L) 11.7(L)  Hematocrit 36.0 - 46.0 % 35.3(L) 35.0(L) 37.7  Platelets 150 - 400 K/uL  291 - 266   CMP Latest Ref Rng & Units 10/15/2017 10/14/2017 10/14/2017  Glucose 65 - 99 mg/dL 111(H) 226(H) 230(H)  BUN 6 - 20 mg/dL 7 10 11   Creatinine 0.44 - 1.00 mg/dL 0.44 0.40(L) 0.51  Sodium 135 - 145 mmol/L 139 140 138  Potassium 3.5 - 5.1 mmol/L 3.8 3.4(L) 3.5  Chloride 101 - 111 mmol/L 106 102 105  CO2 22 - 32 mmol/L 26 - 25  Calcium 8.9 - 10.3 mg/dL 8.9 - 9.2  Total Protein 6.5 - 8.1 g/dL - - 7.3  Total Bilirubin 0.3 - 1.2 mg/dL - - 0.7  Alkaline Phos 38 - 126 U/L - - 110  AST 15 - 41 U/L - - 15  ALT 14 - 54 U/L - - 16   CT Head: IMPRESSION: 1. Unremarkable CT appearance of the brain. 2. Left maxillary sinusitis.  CT Angio Head/Neck: IMPRESSION: 1. No emergent large vessel occlusion or stenosis. 2. Mild bilateral level II lymphadenopathy, nonspecific though may be reactive. No primary neck mass evident.  MRI Brain: IMPRESSION: Normal MRI of the brain Sinus mucosal disease with air-fluid level left maxillary sinus.  Echocardiogram: Study Conclusions - Left ventricle: The cavity size was normal. Systolic function was   normal. The estimated ejection fraction was in the range of 60%   to 65%. Wall motion was normal; there were no regional wall   motion abnormalities. Doppler parameters are consistent with   abnormal left ventricular relaxation (grade 1 diastolic   dysfunction). - Aortic valve: Valve area (VTI): 2.68 cm^2. Valve area (Vmax):   2.94 cm^2. Valve area (Vmean): 2.89 cm^2. - Left atrium: The atrium was mildly dilated.  Discharge Instructions: Discharge Instructions    Call MD for:  extreme fatigue   Complete by:  As directed    Call MD for:  persistant dizziness or light-headedness   Complete by:  As directed    Diet - low sodium heart healthy   Complete by:  As directed    Increase activity slowly   Complete by:  As directed      Signed: Kathi Ludwig, MD 10/15/2017, 4:08 PM   Pager: Pager# [phone]

## 2017-10-15 NOTE — Progress Notes (Signed)
Pt discharged education and instructions completed and pt voices understanding; denies any questions. Pt IV and telemetry removed; pt advised to pick up electronically sent prescriptions from outpatient pharmacy on file. Pt to have family drop her off at outpatient pharmacy to pick up medications. Pt transported off unit via wheelchair with belongings to the side. Dionne BucyP. Amo Deangelo Berns RN

## 2017-10-15 NOTE — Telephone Encounter (Signed)
Hospital TOC per Dr Crista ElliotHarbrecht, discharge 10/15/2017 appt 10/22/2017.

## 2017-10-15 NOTE — Progress Notes (Signed)
  Echocardiogram 2D Echocardiogram has been performed.  Lisa ChimesWendy  Jah Moreno 10/15/2017, 3:28 PM

## 2017-10-15 NOTE — Evaluation (Signed)
Occupational Therapy Evaluation and Discharge Patient Details Name: Lisa Moreno MRN: [id number] DOB: July 21, 1975 Today's Date: 10/15/2017    History of Present Illness Patient is a 42 y/o female presenting with sudden onset of dizziness, blurred vision, and gait disturbances. Associated transient central vision loss and generalized heaviness. 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 Patient with a PMH significant for asthma, DM, HTN.    Clinical Impression   Patient evaluated by Occupational Therapy with no further acute OT needs identified. She is completing ADL at independent PLOF in hospital setting. With functional vision assessment, no deficits noted. All education has been completed and the patient has no further questions. See below for any follow-up Occupational Therapy or equipment needs. OT to sign off. Thank you for referral.      Follow Up Recommendations  No OT follow up    Equipment Recommendations  None recommended by OT    Recommendations for Other Services       Precautions / Restrictions Precautions Precautions: Fall Restrictions Weight Bearing Restrictions: No      Mobility Bed Mobility Overal bed mobility: Modified Independent             General bed mobility comments: OOB in bathroom on my arrival.   Transfers Overall transfer level: Modified independent Equipment used: None Transfers: Sit to/from Stand Sit to Stand: Supervision         General transfer comment: At times steadying herself on furniture.     Balance Overall balance assessment: Mild deficits observed, not formally tested                                         ADL either performed or assessed with clinical judgement   ADL Overall ADL's : Independent                                             Vision Baseline Vision/History:  No visual deficits Patient Visual Report: Other (comment)(blurring and central impairment yesterday, resolved) Vision Assessment?: Yes Eye Alignment: Within Functional Limits Ocular Range of Motion: Within Functional Limits Alignment/Gaze Preference: Within Defined Limits Tracking/Visual Pursuits: Able to track stimulus in all quads without difficulty Saccades: Within functional limits Convergence: Within functional limits Visual Fields: No apparent deficits Additional Comments: No functional visual deficits.      Perception     Praxis      Pertinent Vitals/Pain Pain Assessment: No/denies pain     Hand Dominance     Extremity/Trunk Assessment Upper Extremity Assessment Upper Extremity Assessment: Overall WFL for tasks assessed   Lower Extremity Assessment Lower Extremity Assessment: Overall WFL for tasks assessed   Cervical / Trunk Assessment Cervical / Trunk Assessment: Normal   Communication Communication Communication: No difficulties   Cognition Arousal/Alertness: Awake/alert Behavior During Therapy: WFL for tasks assessed/performed Overall Cognitive Status: Within Functional Limits for tasks assessed                                     General Comments  Educated pt concerning safety precautions in hospital setting.     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, 1 with a disability; pt homeschools her children        OT  Problem List: Impaired vision/perception;Impaired balance (sitting and/or standing)      OT Treatment/Interventions:      OT Goals(Current goals can be found in the care plan section) Acute Rehab OT Goals Patient Stated Goal: return home OT Goal Formulation: With patient  OT Frequency:     Barriers to D/C:            Co-evaluation              AM-PAC PT "6 Clicks" Daily Activity     Outcome Measure Help from another person eating meals?: None Help from another person taking care of personal grooming?: None Help from another person toileting, which includes using toliet, bedpan, or urinal?: None Help from another person bathing (including washing, rinsing, drying)?: None Help from another person to put on and taking off regular upper body clothing?: None Help from another person to put on and taking off regular lower body clothing?: None 6 Click Score: 24   End of Session    Activity Tolerance: Patient tolerated treatment well Patient left: in chair;with call bell/phone within reach  OT Visit Diagnosis: Other abnormalities of gait and mobility (R26.89)                Time: 1610-9604: 0837-0845 OT Time Calculation (min): 8 min Charges:  OT General Charges $OT Visit: 1 Visit OT Evaluation $OT Eval Low Complexity: 1 Low G-Codes:     Doristine Sectionharity A Saydie Gerdts, MS OTR/L  Pager: [phone]   Dashton Czerwinski A Saoirse Legere 10/15/2017, 9:38 AM

## 2017-10-15 NOTE — Progress Notes (Addendum)
STROKE TEAM PROGRESS NOTE  HPI:( Dr Lisa Moreno)  Lisa CrumbleJohanna Luyando [name] is an 42 y.o. female who has HTN, DM2, Asthma, GERD presents to ER via POV. While sitting in church today pt states she suddenly felt sleepy, dizzy and had blurred vision. When she got up to go to the bathroom she fell into someone along the way. There was no fall to ground or injury, she simply describes not "walking right". She and husband state this started at 1100. Upon exam in CT scanner, she becomes very dizzy upon sitting/standing. She is able to ambulate few feet and gait is not currently ataxic. She has no weakness or focal deficits. She describes left hand only as "ants crawling" in tingling sensation, no sensory deficit. Emergent evaluation of CTH showed no bleed and no acute changes. 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 No LVO. There are no CI to IV tPA and this was d/w pt and family given the presenting symptoms possibly being a posterior circulation stroke. However, this was declined by pt after further discussion and it was determined to not use IV tPA for remaining mild symptoms as well as diagnostic uncertainty. She will need admission for stroke/TIA wk up.  Date last known well: 10/13/17 Time last known well: 1100  tPA Given: No. Mild symptoms/symptoms resolved.    INTERVAL HISTORY No family is at the bedside.  She recounted HPI with team - sx onset at church, feeling slow and unsteady, like when you take Nyquil. Has hx of vertigo. Denies room spinning, complains of L hand tingling x 2 hrs, no tingling in the face. Leaned to the R.  She requests sedation for MRI as she is intolerant.  Vitals:   10/14/17 1738 10/14/17 2009 10/15/17 0000 10/15/17 0308  BP: 112/76 (!) 131/44 (!) 112/49 (!) 122/57  Pulse: 68 73 73 78  Resp: 20 18  18   Temp: 98.2 F (36.8 C) 98.2 F (36.8 C) 98.4 F (36.9 C) 98.4 F (36.9 C)  TempSrc:  Oral  Oral Oral  SpO2: 100% 98% 98% 97%  Weight:      Height:        CBC:  Recent Labs  Lab 10/14/17 1152 10/14/17 1200 10/15/17 0538  WBC 10.4  --  12.1*  NEUTROABS 7.0  --   --   HGB 11.7* 11.9* 11.1*  HCT 37.7 35.0* 35.3*  MCV 76.8*  --  75.8*  PLT 266  --  291    Basic Metabolic Panel:  Recent Labs  Lab 10/14/17 1152 10/14/17 1200 10/15/17 0538  NA 138 140 139  K 3.5 3.4* 3.8  CL 105 102 106  CO2 25  --  26  GLUCOSE 230* 226* 111*  BUN 11 10 7   CREATININE 0.51 0.40* 0.44  CALCIUM 9.2  --  8.9   Lipid Panel:     Component Value Date/Time   CHOL 123 10/14/2017 1152   TRIG 87 10/14/2017 1152   HDL 34 (L) 10/14/2017 1152   CHOLHDL 3.6 10/14/2017 1152   VLDL 17 10/14/2017 1152   LDLCALC 72 10/14/2017 1152   HgbA1c:  Lab Results  Component Value Date   HGBA1C 6.9 (H) 10/14/2017   Urine Drug Screen: No results found for: LABOPIA, COCAINSCRNUR, LABBENZ, AMPHETMU, THCU, LABBARB  Alcohol Level No results found for: ETH  IMAGING  Ct Head Code Stroke Wo Contrast 10/14/2017 1. Unremarkable CT appearance of the brain.  2. Left maxillary sinusitis.   Ct Angio Head W Or Wo Contrast Ct Angio Neck W Or Wo Contrast 10/14/2017 1. No emergent large vessel occlusion or stenosis. 2. Mild bilateral level II lymphadenopathy, nonspecific though may be reactive. No primary neck mass evident.   MRI pending   2D Echocardiogram  pending    PHYSICAL EXAM  Obese middle-aged Hispanic lady currently not in distress. . Afebrile. Head is nontraumatic. Neck is supple without bruit.    Cardiac exam no murmur or gallop. Lungs are clear to auscultation. Distal pulses are well felt..  Neurological Exam ;  Awake  Alert oriented x 3. Normal speech and language.eye movements full without nystagmus.fundi were not visualized. Vision acuity and fields appear normal. Hearing is normal. Palatal movements are normal. Face symmetric. Tongue midline. Normal strength, tone, reflexes and coordination.  Normal sensation. Gait deferred.   ASSESSMENT/PLAN Lisa Moreno is a 42 y.o. female with history of asthma, DB, GERD and HTN presenting with L hand tingling and dizziness.   Possible TIA  Code Stroke CT head No acute stroke. L maxillary sinusitis  CTA head & neck no ELVO. Mild lymphadenopathy  MRI  pending   2D Echo  pending   LDL 72  HgbA1c 6.9  Lovenox 40 mg sq daily for VTE prophylaxis  aspirin 81 mg daily prior to admission, now on aspirin 81 mg daily. Continue aspirin at d/c  Therapy recommendations:  No therapy needs  Disposition:  Return home  Hypertension  Stable . Permissive hypertension (OK if < 220/120) but gradually normalize in 5-7 days . Long-term BP goal normotensive  Hyperlipidemia  Home meds:  No statin  LDL 72, goal < 70  If imaging positive for stroke, add statin  Diabetes type II  HgbA1c 6.9, goal < 7.0  Controlled  Other Stroke Risk Factors  Obesity, Body mass index is 38.14 kg/m., recommend weight loss, diet and exercise as appropriate   Family hx stroke (mother in the 85s, dad a few years ago)  Other Active Problems  hyperthyroid  Hospital day # 0  Lisa Main, MSN, APRN, ANVP-BC, AGPCNP-BC Advanced Practice Stroke Nurse [facility] See Amion for Schedule & Pager information 10/15/2017 1:26 PM  I have personally examined this patient, reviewed notes, independently viewed imaging studies, participated in medical decision making and plan of care.ROS completed by me personally and pertinent positives fully documented  I have made any additions or clarifications directly to the above note. Agree with note above. He presented with sudden onset of gait ataxia, dizziness and blurred vision possibly from posterior circulation TIA CT angiogram shows no significant large vessel stenosis. MRI is pending but patient may not be able to afford it as she has no health insurance. Primary team appears to think this  may be related to hyperthyroidism. Recommend aspirin 81 mg daily and aggressive risk factor modification. Discussed with patient and medical team resident doctor. Greater than 50% time during this 35 minutes visit was spent in counseling and coordination of care about TIA and stroke risk prevention and treatment discussion.  Delia Heady, MD Medical Director [facility] Pager: [phone] 10/15/2017 3:29 PM  To contact Stroke Continuity provider, please refer to [email or web address]. After hours, contact General Neurology

## 2017-10-15 NOTE — Progress Notes (Signed)
   Subjective: Patient was resting comfortably in a chair today inside her bed.  She denies dizziness. Her dizziness, visual changes and tingling have resolved.  The patient endorsed a 20 pound plus weight gain over the past 2 months in addition to her fatigue and generalized sleepiness.  She denies cardiac palpitations, but also endorses increased anxiety and agitation in the past few months.  She is a family history of hyperthyroidism most notably in her mother.   Objective:  Vital signs in last 24 hours: Vitals:   10/14/17 1738 10/14/17 2009 10/15/17 0000 10/15/17 0308  BP: 112/76 (!) 131/44 (!) 112/49 (!) 122/57  Pulse: 68 73 73 78  Resp: 20 18  18   Temp: 98.2 F (36.8 C) 98.2 F (36.8 C) 98.4 F (36.9 C) 98.4 F (36.9 C)  TempSrc:  Oral Oral Oral  SpO2: 100% 98% 98% 97%  Weight:      Height:       Physical Exam  Constitutional: She is oriented to person, place, and time. She appears well-nourished.  Neck: Thyromegaly present.  Cardiovascular: Normal rate and regular rhythm.  No murmur heard. Pulmonary/Chest: Effort normal and breath sounds normal. No respiratory distress.  Musculoskeletal: She exhibits no edema or tenderness.  Neurological: She is alert and oriented to person, place, and time.  Skin: Skin is warm. Capillary refill takes less than 2 seconds. She is not diaphoretic.  Psychiatric: She has a normal mood and affect.   Assessment/Plan:  Active Problems:   Dizziness  Assessment: Lisa Moreno is a 42 yo F w/ a PMHx notable for HTN and DMII neither currently treated for lack of insurance and affordability of a PCP or medications who presented for acute onset dizziness.  Her symptoms were persistent for greater than 3 hours but progressively improved and had resolved overnight.  Initial evaluation with CT and CT angio of the brain and neck failed to reveal clear etiology without notable mass or structural abnormalities consistent with her presentation.  Stroke  evaluation was initiated but tPA was not indicated. Her hemoglobin A1c is 6.9% with a lipid profile with total cholesterol of 123, HDL 39, LDL 72.  TSH revealed a level less than 0.010 with reflex T3 and free T4 ordered.   Plan Hyperthyroidism: The patient symptoms are consistent with possible presyncope a notable occurrence in sever hyperthyroidism with associated palpitations common. This appears less likely a vestibular syndrome or posterior cerebellar infarct but without MRI the infarct can not be completely ruled out although it is unlikely given her risk factors assessed thus far and history/physical exam.  Given the length of time she experienced symptoms, lack of nystagmus or other findings likely consistent with vertigo I feel less inclined to associate this with the illness. -TSH undetectable  -Will likely initiate methimazole therapy pending T3-T4 -history of weight loss, agitation, anxiety -T3 free T4 ordered and pending -Echocardiogram ordered -Consider canceling MRI  HTN:  The patient's blood pressure currently well controlled at 122/47.  No indication for therapy  Diabetes mellitus type 2: This patient's blood glucose levels have ranged from approximately 100-138.  No indication for insulin therapy.  Diet: Carb modified Code: Full Fluids: N/A GI PPX: N/A VTE PPX: Enoxaparin Dispo: Anticipated discharge in approximately 0-1 day(s).   Lanelle BalHarbrecht, Natthew Marlatt, MD 10/15/2017, 6:28 AM Pager: Pager# [phone]

## 2017-10-15 NOTE — Progress Notes (Signed)
Pt back to room from MRI. P. Amo Codey Burling RN °

## 2017-10-15 NOTE — Progress Notes (Signed)
Internal medicine also scheduled an appointment for the patient. Patient aware and will cancel [facility] appt. [facility] This closes at 6 pm. Bedside RN to have prescriptions printed if pt unable to get to outpatient pharmacy by 6 pm. [facility] and [facility] is [facility]. CM also provided her a coupon for the medications.  Pts spouse to provide transportation home.

## 2017-10-15 NOTE — Progress Notes (Signed)
Pt transported off unit to MRI. P. Amo Milferd Ansell RN 

## 2017-10-15 NOTE — Care Management Note (Signed)
Case Management Note  Patient Details  Name: Alfonso RamusJohanna Luyando De La Paz MRN: [id number] Date of Birth: 12-23-1975  Subjective/Objective:       Pt admitted with dizziness. She is from home with spouse.  Pt without insurance and no PCP.             Action/Plan: Pt interested in going to one of the [facility]. CM was able to obtain [facility]. Information on the AVS. Pt also informed about using the [facility] pharmacy for assistance with her medications.  No f/u per PT/OT and no DME needs.  CM following.  Expected Discharge Date:  10/15/17               Expected Discharge Plan:  Home/Self Care  In-House Referral:     Discharge planning Services  CM Consult, Indigent Health Clinic  Post Acute Care Choice:    Choice offered to:     DME Arranged:    DME Agency:     HH Arranged:    HH Agency:     Status of Service:  In process, will continue to follow  If discussed at Long Length of Stay Meetings, dates discussed:    Additional Comments:  Kermit BaloKelli F Amauri Medellin, RN 10/15/2017, 1:34 PM

## 2017-10-15 NOTE — Discharge Instructions (Signed)
Thank you for your visit to the [facility]. If your symptoms return please notify us.   If you have any questions please call us at [phone] 24/7.   If you develop lightheadedness upon standing, dizziness, weakness or other concerns please notify our clinic.   You will need to call and cancel one of the appointments listed on the discharge paperwork. We have requested that you establish with our clinic in order to provide the discounted medications.

## 2017-10-15 NOTE — Evaluation (Signed)
Physical Therapy Evaluation Patient Details Name: Lisa Moreno MRN: [id number] DOB: December 22, 1975 Today's Date: 10/15/2017   History of Present Illness  Patient is a 42 y/o female presenting with sudden onset of dizziness, blurred vision, and gait disturbances. Associated transient central vision loss and generalized heaviness. 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 Patient with a PMH significant for asthma, DM, HTN.   Clinical Impression  Patient admitted with the above listed diagnosis. 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 Patient reporting all symptoms have resolved and is feeling at baseline level of functioning. Able to ambulate for prolonged distances as well as navigate stairs without LOB or instability - does reports slight hip discomfort, of which she reports is her normal. No further acute PT needs identified. PT to sign off. Thanks for the referral!    Follow Up Recommendations No PT follow up    Equipment Recommendations  None recommended by PT    Recommendations for Other Services       Precautions / Restrictions Precautions Precautions: Fall Restrictions Weight Bearing Restrictions: No      Mobility  Bed Mobility Overal bed mobility: Modified Independent                Transfers Overall transfer level: Needs assistance Equipment used: None Transfers: Sit to/from Stand Sit to Stand: Supervision         General transfer comment: for general safety and immediate standing balance  Ambulation/Gait Ambulation/Gait assistance: Supervision;Min guard Gait Distance (Feet): 150 Feet Assistive device: None Gait Pattern/deviations: Step-through pattern;Decreased stride length;Narrow base of support Gait velocity: decreased   General Gait Details:  slight "limp" due to hip pain of which pateint reports is a t baseline  Stairs Stairs: Yes Stairs assistance: Min guard;Supervision Stair Management: Two rails;Alternating pattern;Forwards Number of Stairs: 2 General stair comments: no instability; Min guard only for safety  Wheelchair Mobility    Modified Rankin (Stroke Patients Only)       Balance Overall balance assessment: Mild deficits observed, not formally tested                                           Pertinent Vitals/Pain Pain Assessment: No/denies pain    Home Living Family/patient expects to be discharged to:: Private residence Living Arrangements: Spouse/significant other;Children Available Help at Discharge: Family Type of Home: House Home Access: Stairs to enter Entrance Stairs-Rails: Can reach both Entrance Stairs-Number of Steps: 4 Home Layout: One level Home Equipment: None      Prior Function Level of Independence: Independent         Comments: stay at home mom for 2 children, 1 of which is disabled     Hand Dominance        Extremity/Trunk Assessment        Lower Extremity Assessment Lower Extremity Assessment: Overall WFL for tasks assessed    Cervical / Trunk Assessment Cervical / Trunk Assessment: Normal  Communication   Communication: No difficulties  Cognition Arousal/Alertness: Awake/alert Behavior During Therapy: WFL for tasks assessed/performed Overall Cognitive Status: Within Functional Limits for tasks assessed  General Comments      Exercises     Assessment/Plan    PT Assessment Patent does not need any further PT services  PT Problem List         PT Treatment Interventions      PT Goals (Current goals can be found in the Care Plan section)  Acute Rehab PT Goals Patient Stated Goal: return home PT Goal Formulation: With patient Time For Goal Achievement: 10/22/17 Potential to  Achieve Goals: Good    Frequency     Barriers to discharge        Co-evaluation               AM-PAC PT "6 Clicks" Daily Activity  Outcome Measure Difficulty turning over in bed (including adjusting bedclothes, sheets and blankets)?: A Little Difficulty moving from lying on back to sitting on the side of the bed? : A Little Difficulty sitting down on and standing up from a chair with arms (e.g., wheelchair, bedside commode, etc,.)?: A Little Help needed moving to and from a bed to chair (including a wheelchair)?: A Little Help needed walking in hospital room?: A Little Help needed climbing 3-5 steps with a railing? : A Little 6 Click Score: 18    End of Session Equipment Utilized During Treatment: Gait belt Activity Tolerance: Patient tolerated treatment well Patient left: in chair;with call bell/phone within reach Nurse Communication: Mobility status PT Visit Diagnosis: Unsteadiness on feet (R26.81);Other abnormalities of gait and mobility (R26.89)    Time: [id number] PT Time Calculation (min) (ACUTE ONLY): 15 min   Charges:   PT Evaluation $PT Eval Low Complexity: 1 Low     PT G Codes:        Kipp LaurenceStephanie R Alvino Lechuga, PT, DPT 10/15/17 9:06 AM

## 2017-10-16 ENCOUNTER — Encounter (HOSPITAL_COMMUNITY): Payer: Self-pay | Admitting: Internal Medicine

## 2017-10-16 LAB — T3: T3, Total: 204 ng/dL — ABNORMAL HIGH (ref 71–180)

## 2017-10-22 ENCOUNTER — Ambulatory Visit: Payer: Self-pay

## 2017-10-23 NOTE — Telephone Encounter (Signed)
Patient no showed HFU 10/22/2017. Lisa Moreno. [place], RN, BSN

## 2017-10-25 ENCOUNTER — Emergency Department (HOSPITAL_COMMUNITY)
Admission: EM | Admit: 2017-10-25 | Discharge: 2017-10-25 | Disposition: A | Discharge status: Home / Self Care | Payer: Self-pay | Attending: Emergency Medicine | Admitting: Emergency Medicine

## 2017-10-25 ENCOUNTER — Other Ambulatory Visit: Payer: Self-pay

## 2017-10-25 ENCOUNTER — Emergency Department (HOSPITAL_COMMUNITY): Payer: Self-pay

## 2017-10-25 DIAGNOSIS — Z7984 Long term (current) use of oral hypoglycemic drugs: Secondary | ICD-10-CM | POA: Insufficient documentation

## 2017-10-25 DIAGNOSIS — J449 Chronic obstructive pulmonary disease, unspecified: Secondary | ICD-10-CM | POA: Insufficient documentation

## 2017-10-25 DIAGNOSIS — E119 Type 2 diabetes mellitus without complications: Secondary | ICD-10-CM | POA: Insufficient documentation

## 2017-10-25 DIAGNOSIS — I10 Essential (primary) hypertension: Secondary | ICD-10-CM | POA: Insufficient documentation

## 2017-10-25 DIAGNOSIS — S99921A Unspecified injury of right foot, initial encounter: Secondary | ICD-10-CM

## 2017-10-25 DIAGNOSIS — M79674 Pain in right toe(s): Secondary | ICD-10-CM | POA: Insufficient documentation

## 2017-10-25 NOTE — ED Triage Notes (Signed)
Patient stubbed her right pinky toe yesterday against her sofa. Toe and foot is bruised and swollen. Patient alert, oriented, and ambulating independently with steady gait.

## 2017-10-25 NOTE — ED Notes (Signed)
Patient transported to X-ray 

## 2017-10-25 NOTE — ED Notes (Signed)
Patient returned from XRay

## 2017-10-25 NOTE — ED Provider Notes (Signed)
[facility] EMERGENCY DEPARTMENT Provider Note   CSN: [id number] Arrival date & time: 10/25/17  1419     History   Chief Complaint Chief Complaint  Patient presents with  . Toe Pain    HPI Staley Budzinski is a 42 y.o. female.  HPI   Dalila Arca Rolene Arbour is a 42 y.o. female, with a history of HTN and DM, presenting to the ED with right small toe injury that occurred yesterday.  States she hit the toe on her sofa.  Pain is throbbing, severe, radiating into the distal foot.  She has not tried any medications for her pain.  Denies numbness, weakness, falls, or any other complaints.      Past Medical History:  Diagnosis Date  . Advanced maternal age in multigravida   . Asthma   . Cough 06/16/2015   Over two months coughing paroxysms; no focal findings on lung exam. Feels ill. CXR ordered today. Empiric treatment atypicals with macrolide.    . Diabetes mellitus without complication (HCC)   . GERD (gastroesophageal reflux disease)   . Gestational diabetes   . Hypertension   . Hypertension affecting pregnancy, antepartum 11/09/2014   Baseline Protein Creatinine ratio =0.12;   Baseline 24 hour urine = 11 Baseline labs 24 hour urine    . Placenta previa centralis 12/10/2014   Central Previa--vaginal rest. Scheduled CS F/U US with MFM   . Pregnancy with type 2 diabetes mellitus in second trimester 11/09/2014   Class C Needs baseline labs [x]  Cr-wnl [x]  TSH-wnl in 2015 [x]  HgbA1C 5.9% [x]  24 hr urine=16 [x]  Baby ASA [x]  Fetal ECHO-8/30 normal [ ]  optho [ ]  serial u/s for growt [ ]  2x/wk testing at 32 wks     . Rapid heart beat 11/09/2014   Cardiology referral sue to heart palpitations and some chest pain. Obesity, DM, HTN   . 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  Rubella:  Immune  GTT  N/A, Class C DM RPR:   NR  Flu vaccine  HBsAg:   NEG  TDa    Patient Active Problem List   Diagnosis Date Noted  . DM (diabetes mellitus) type 2, uncontrolled, with ketoacidosis (HCC)   . Hyperthyroidism   . Postural dizziness with presyncope   . Dizziness 10/14/2017  . Microcytic anemia 06/16/2015  . SOB (shortness of breath) 05/05/2015  . PND (paroxysmal nocturnal dyspnea) 05/05/2015  . Fatigue 05/05/2015  . HTN (hypertension) 12/11/2014  . Abnormal findings on antenatal screening   . Type 2 diabetes mellitus without complication (HCC) 03/30/2014  . Morbid obesity (HCC) 03/30/2014  . Fibromyalgia 01/08/2013    Past Surgical History:  Procedure Laterality Date  . ABDOMINAL HYSTERECTOMY    . BREAST REDUCTION SURGERY    . ovarian torsion    . TONSILLECTOMY       OB History    Gravida  3   Para  2   Term  2   Preterm  0   AB  0   Living  2     SAB  0  TAB  0   Ectopic  0   Multiple  0   Live Births  1            Home Medications    Prior to Admission medications   Medication Sig Start Date End Date Taking? Authorizing Provider  atenolol (TENORMIN) 25 MG tablet Take 0.5 tablets (12.5 mg total) by mouth daily. 10/15/17   Lanelle Bal, MD  gabapentin (NEURONTIN) 300 MG capsule Take 1 capsule (300 mg total) by mouth at bedtime. 10/15/17   Lanelle Bal, MD  metFORMIN (GLUCOPHAGE) 500 MG tablet Take 1 tablet (500 mg total) by mouth 2 (two) times daily with a meal. 10/15/17   Lanelle Bal, MD  methimazole (TAPAZOLE) 5 MG tablet Take 3 tablets (15 mg total) by mouth daily. 10/15/17   Lanelle Bal, MD    Family History Family History  Problem Relation Age of Onset  . Diabetes Mother   . Stroke Mother   . Hypertension Mother   . Depression Mother   . Diabetes  Father   . Stroke Father   . Kidney disease Father   . Hypertension Father     Social History Social History   Tobacco Use  . Smoking status: Never Smoker  . Smokeless tobacco: Never Used  Substance Use Topics  . Alcohol use: No  . Drug use: No     Allergies   Decadron [dexamethasone]   Review of Systems Review of Systems  Musculoskeletal: Positive for arthralgias and joint swelling.  Neurological: Negative for weakness and numbness.     Physical Exam Updated Vital Signs BP (!) 144/78 (BP Location: Right Arm)   Pulse 71   Temp 98.5 F (36.9 C) (Oral)   Resp 16   LMP 08/05/2014   SpO2 97%   Physical Exam  Constitutional: She appears well-developed and well-nourished. No distress.  HENT:  Head: Normocephalic and atraumatic.  Eyes: Conjunctivae are normal.  Neck: Neck supple.  Cardiovascular: Normal rate, regular rhythm and intact distal pulses.  Pulmonary/Chest: Effort normal.  Musculoskeletal: She exhibits edema and tenderness.  Tenderness, edema, and bruising to the right small toe with minimal edema crossing the MTP joint. No tenderness or other abnormality noted to the remainder of the foot or toes.  Neurological: She is alert.  Sensation to light touch grossly intact in the toes of the right foot. Motor function intact in each of the toes.  Skin: Skin is warm and dry. Capillary refill takes less than 2 seconds. She is not diaphoretic. No pallor.  Psychiatric: She has a normal mood and affect. Her behavior is normal.  Nursing note and vitals reviewed.    ED Treatments / Results  Labs (all labs ordered are listed, but only abnormal results are displayed) Labs Reviewed - No data to display  EKG None  Radiology Dg Foot Complete Right  Result Date: 10/25/2017 CLINICAL DATA:  Patient stubbed her right pinky toe yesterday against her sofa. Toe and foot is bruised and swollen. EXAM: RIGHT FOOT COMPLETE - 3+ VIEW COMPARISON:  None. FINDINGS: There is no  evidence of fracture or dislocation. There is no evidence of arthropathy or other focal bone abnormality. Soft tissues are unremarkable. IMPRESSION: Negative. Electronically Signed   By: Amie Portland M.D.   On: 10/25/2017 15:15    Procedures Procedures (including critical care time)  Medications Ordered in ED Medications - No data to display   Initial Impression / Assessment and Plan / ED Course  I have reviewed the triage  vital signs and the nursing notes.  Pertinent labs & imaging results that were available during my care of the patient were reviewed by me and considered in my medical decision making (see chart for details).     Patient presents with a right small toe injury.  Neurovascularly intact.  No acute abnormality on x-ray.  Advised patient to limit pain management options to Tylenol due to recent TIA.  PCP versus podiatry follow-up. The patient was given instructions for home care as well as return precautions. Patient voices understanding of these instructions, accepts the plan, and is comfortable with discharge.      Final Clinical Impressions(s) / ED Diagnoses   Final diagnoses:  Injury of toe on right foot, initial encounter    ED Discharge Orders    None       Concepcion LivingJoy, Analei Whinery C, PA-C 10/25/17 1605    Tilden Fossaees, Elizabeth, MD 10/26/17 1537

## 2017-10-25 NOTE — Discharge Instructions (Addendum)
You have been seen today for a toe injury. There were no acute abnormalities on the x-rays, including no sign of fracture or dislocation, however, there could be injuries to the soft tissues, such as the ligaments or tendons that are not seen on xrays. There could also be what are called occult fractures that are small fractures not seen on xray. Tylenol: May take Tylenol, as needed, for pain. Your daily total maximum amount of tylenol from all sources should be limited to 4000mg /day for persons without liver problems, or 2000mg /day for those with liver problems. Ice: May apply ice to the area over the next 24 hours for 15 minutes at a time to reduce swelling. Elevation: Keep the extremity elevated as often as possible to reduce pain and inflammation. Support: Wear the post-op shoe for support and comfort. Wear this until pain resolves. You will be weight-bearing as tolerated, which means you can slowly start to put weight on the extremity and increase amount and frequency as pain allows. Follow up: If symptoms are improving, you may follow up with your primary care provider for any continued management. If symptoms are not starting to improve within a week, you should follow up with the foot specialist within two weeks. Return: Return to the ED for numbness, weakness, increasing pain, overall worsening symptoms, loss of function, or if symptoms are not improving, you have tried to follow up with the foot specialist, and have been unable to do so.

## 2017-10-25 NOTE — Progress Notes (Signed)
Orthopedic Tech Progress Note Patient Details:  Alfonso RamusJohanna Luyando De La Paz 18-Oct-1975 [id number]  Ortho Devices Type of Ortho Device: Crutches, Postop shoe/boot Ortho Device/Splint Interventions: Application   Post Interventions Patient Tolerated: Well, Ambulated well Instructions Provided: Care of device   Saul FordyceJennifer C Benford Asch 10/25/2017, 3:41 PM

## 2017-11-08 ENCOUNTER — Other Ambulatory Visit (INDEPENDENT_AMBULATORY_CARE_PROVIDER_SITE_OTHER): Payer: Self-pay | Admitting: Physician Assistant

## 2017-11-08 ENCOUNTER — Telehealth (INDEPENDENT_AMBULATORY_CARE_PROVIDER_SITE_OTHER): Payer: Self-pay | Admitting: Physician Assistant

## 2017-11-08 ENCOUNTER — Ambulatory Visit (INDEPENDENT_AMBULATORY_CARE_PROVIDER_SITE_OTHER): Payer: Self-pay | Admitting: Physician Assistant

## 2017-11-08 ENCOUNTER — Other Ambulatory Visit: Payer: Self-pay

## 2017-11-08 ENCOUNTER — Encounter (INDEPENDENT_AMBULATORY_CARE_PROVIDER_SITE_OTHER): Payer: Self-pay | Admitting: Physician Assistant

## 2017-11-08 VITALS — BP 125/80 | HR 70 | Temp 98.0°F | Ht 64.0 in | Wt 221.2 lb

## 2017-11-08 DIAGNOSIS — E0590 Thyrotoxicosis, unspecified without thyrotoxic crisis or storm: Secondary | ICD-10-CM

## 2017-11-08 DIAGNOSIS — E1142 Type 2 diabetes mellitus with diabetic polyneuropathy: Secondary | ICD-10-CM

## 2017-11-08 DIAGNOSIS — Z760 Encounter for issue of repeat prescription: Secondary | ICD-10-CM

## 2017-11-08 MED ORDER — METHIMAZOLE 5 MG PO TABS: 15.0000 mg | ORAL_TABLET | Freq: Every day | ORAL | 3 refills | Status: DC

## 2017-11-08 MED ORDER — METFORMIN HCL 500 MG PO TABS: 500.0000 mg | ORAL_TABLET | Freq: Two times a day (BID) | ORAL | 11 refills | Status: DC

## 2017-11-08 MED ORDER — ATENOLOL 25 MG PO TABS: 12.5000 mg | ORAL_TABLET | Freq: Every day | ORAL | 11 refills | Status: DC

## 2017-11-08 MED ORDER — GABAPENTIN 300 MG PO CAPS: 300.0000 mg | ORAL_CAPSULE | Freq: Every day | ORAL | 5 refills | Status: DC

## 2017-11-08 MED ORDER — LANCETS MISC: 1.0000 | Freq: Three times a day (TID) | 11 refills | Status: AC

## 2017-11-08 MED ORDER — GLUCOSE BLOOD VI STRP: ORAL_STRIP | 11 refills | Status: AC

## 2017-11-08 MED ORDER — TRUE METRIX METER DEVI: 1.0000 | Freq: Every day | 0 refills | Status: AC

## 2017-11-08 MED FILL — methIMAzole 5 MG TABS: 5 | 30 days supply | Qty: 90 | Fill #0

## 2017-11-08 MED FILL — metFORMIN HCL 500 MG TABS: 500 | 30 days supply | Qty: 60 | Fill #0

## 2017-11-08 NOTE — Telephone Encounter (Signed)
Sent to [facility]

## 2017-11-08 NOTE — Patient Instructions (Signed)
Hipertiroidismo  Hyperthyroidism  El hipertiroidismo ocurre cuando la tiroides est demasiado activa (hiperactiva). La tiroides es una glndula grande ubicada en el cuello que ayuda a controlar la forma en que el organismo usa los alimentos (metabolismo). Cuando la tiroides est hiperactiva, produce una cantidad excesiva de una hormona llamada tiroxina.  Cules son las causas?  Las causas del hipertiroidismo pueden incluir lo siguiente:   Enfermedad de Graves-Basedow. Se produce cuando el sistema inmunitario ataca la tiroides. Esta es la causa ms frecuente.   Inflamacin de la tiroides.   Tumor en la tiroides o en otro lugar.   Uso excesivo de medicamentos para la tiroides, entre ellos:  ? Suplementos recetados para la tiroides.  ? Suplementos a base de hierbas que imitan a las hormonas tiroideas.   Bultos slidos o llenos de lquido en la tiroides (ndulos tiroideos).   Ingesta excesiva de yodo.    Qu incrementa el riesgo?   Ser mujer.   Tener antecedentes familiares de enfermedades tiroideas.  Cules son los signos o los sntomas?  Los signos y los sntomas de hipertiroidismo pueden ser los siguientes:   Nerviosismo.   Incapacidad para tolerar el calor.   Prdida de peso sin causa aparente.   Diarrea.   Cambios en la textura del pelo o la piel.   Falta de latidos cardacos o ms latidos cardacos.   Frecuencia cardaca acelerada.   Ausencia de la menstruacin.   Temblores en las manos.   Fatiga.   Agitacin.   Aumento del apetito.   Problemas para dormir.   Aumento del tamao de la tiroides o ndulos.    Cmo se diagnostica?  El diagnstico de hipertiroidismo puede incluir lo siguiente:   Examen fsico y antecedentes mdicos.   Anlisis de sangre.   Ecografas.    Cmo se trata?  El tratamiento puede incluir lo siguiente:   Medicamentos para controlar la tiroides.   Ciruga para extirpar la tiroides.   Radioterapia.    Siga estas instrucciones en su casa:   Tome los  medicamentos solamente como se lo haya indicado el mdico.   No consuma ningn producto que contenga tabaco, lo que incluye cigarrillos, tabaco de mascar o cigarrillos electrnicos. Si necesita ayuda para dejar de fumar, consulte al mdico.   No reanude la actividad fsica ni los ejercicios hasta que el mdico lo autorice.   Concurra a todas las visitas de control, segn le indique su mdico. Esto es importante.  Comunquese con un mdico si:   Los sntomas no mejoran con el tratamiento.   Tiene fiebre.   Est tomando medicamentos sustitutivos de la tiroides y:  ? Est deprimido.  ? Se siente mental y fsicamente lento.  ? Aumenta de peso.  Solicite ayuda de inmediato si:   Disminuy su estado de alerta o hay cambios en la conciencia.   Siente dolor abdominal.   Siente mareos.   Tiene una frecuencia cardaca acelerada.   Tiene latidos cardacos irregulares.  Esta informacin no tiene como fin reemplazar el consejo del mdico. Asegrese de hacerle al mdico cualquier pregunta que tenga.  Document Released: 04/10/2005 Document Revised: 07/17/2016 Document Reviewed: 08/26/2013  Elsevier Interactive Patient Education  2018 Elsevier Inc.

## 2017-11-08 NOTE — Telephone Encounter (Signed)
Patient wants to know if you could send a RX for a diabetes meter or if you could tell her where she can get one at a discounted rate.  Please advice [phone]  Thank you Lisa Moreno

## 2017-11-08 NOTE — Progress Notes (Signed)
Subjective:  Patient ID: Lisa Moreno, female    DOB: 10-21-1975  Age: 42 y.o. MRN: [id number]  CC: hospital f/u  HPI Lisa Moreno is a 42 y.o. female with a medical history of asthma, DM2, GERD, and  HTN presents as a new pt on hospital f/u. Admitted on 10/14/17 and discharged on 10/15/17 with poorly controlled DM, and hyperthyroidism. A1c 6.9% on 10/14/17. TSH <0.010 and T4 2.13. MRI brain was normal without acute findings. Discharged on methimazole, metformin, atenolol, and gabapentin but is not currently taking any of these. Endorses fatigue and some abdominal discomfort. Does not endorse any other symptoms or complaints.        Outpatient Medications Prior to Visit  Medication Sig Dispense Refill  . atenolol (TENORMIN) 25 MG tablet Take 0.5 tablets (12.5 mg total) by mouth daily. (Patient not taking: Reported on 11/08/2017) 30 tablet 0  . gabapentin (NEURONTIN) 300 MG capsule Take 1 capsule (300 mg total) by mouth at bedtime. (Patient not taking: Reported on 11/08/2017) 30 capsule 0  . metFORMIN (GLUCOPHAGE) 500 MG tablet Take 1 tablet (500 mg total) by mouth 2 (two) times daily with a meal. (Patient not taking: Reported on 11/08/2017) 30 tablet 0  . methimazole (TAPAZOLE) 5 MG tablet Take 3 tablets (15 mg total) by mouth daily. (Patient not taking: Reported on 11/08/2017) 90 tablet 0   No facility-administered medications prior to visit.      ROS Review of Systems  Constitutional: Positive for malaise/fatigue. Negative for chills and fever.  Eyes: Negative for blurred vision.  Respiratory: Negative for shortness of breath.   Cardiovascular: Negative for chest pain and palpitations.  Gastrointestinal: Negative for abdominal pain and nausea.  Genitourinary: Negative for dysuria and hematuria.  Musculoskeletal: Negative for joint pain and myalgias.  Skin: Negative for rash.  Neurological: Negative for tingling and headaches.  Psychiatric/Behavioral: Negative  for depression. The patient is not nervous/anxious.     Objective:  BP 125/80 (BP Location: Left Arm, Patient Position: Sitting, Cuff Size: Large)   Pulse 70   Temp 98 F (36.7 C) (Oral)   Ht 5\' 4"  (1.626 m)   Wt 221 lb 3.2 oz (100.3 kg)   LMP 08/05/2014   SpO2 93%   Breastfeeding? No   BMI 37.97 kg/m   BP/Weight 11/08/2017 10/25/2017 10/15/2017  Systolic BP 125 144 126  Diastolic BP 80 78 64  Wt. (Lbs) 221.2 - -  BMI 37.97 - -      Physical Exam  Constitutional: She is oriented to person, place, and time.  Well developed, obese, NAD, polite  HENT:  Head: Normocephalic and atraumatic.  Eyes: No scleral icterus.  Neck: Normal range of motion. Neck supple. Thyromegaly present.  Cardiovascular: Normal rate, regular rhythm and normal heart sounds.  Pulmonary/Chest: Effort normal and breath sounds normal.  Abdominal: Soft. Bowel sounds are normal. There is no tenderness.  Musculoskeletal: She exhibits no edema.  Neurological: She is alert and oriented to person, place, and time.  Skin: Skin is warm and dry. No rash noted. No erythema. No pallor.  Psychiatric: She has a normal mood and affect. Her behavior is normal. Thought content normal.  Vitals reviewed.    Assessment & Plan:    1. Hyperthyroidism - methimazole (TAPAZOLE) 5 MG tablet; Take 3 tablets (15 mg total) by mouth daily.  Dispense: 90 tablet; Refill: 3 - US THYROID; Future - Thyrotropin receptor autoabs - Referral to Endocrinology  2. Diabetic polyneuropathy associated  with type 2 diabetes mellitus (HCC) - metFORMIN (GLUCOPHAGE) 500 MG tablet; Take 1 tablet (500 mg total) by mouth 2 (two) times daily with a meal.  Dispense: 30 tablet; Refill: 11 - gabapentin (NEURONTIN) 300 MG capsule; Take 1 capsule (300 mg total) by mouth at bedtime.  Dispense: 30 capsule; Refill: 5  3. Medication refill - atenolol (TENORMIN) 25 MG tablet; Take 0.5 tablets (12.5 mg total) by mouth daily.  Dispense: 30 tablet; Refill:  11   Meds ordered this encounter  Medications  . methimazole (TAPAZOLE) 5 MG tablet    Sig: Take 3 tablets (15 mg total) by mouth daily.    Dispense:  90 tablet    Ref[facility]:  3    [facility]    Order Specific Question:   Supervising Provider    Answer:   Hoy RegisterNEWLIN, ENOBONG [[id number]]  . metFORMIN (GLUCOPHAGE) 500 MG tablet    Sig: Take 1 tablet (500 mg total) by mouth 2 (two) times daily with a meal.    Dispense:  30 tablet    Refill:  11    Order Specific Question:   Supervising Provider    Answer:   Hoy RegisterNEWLIN, ENOBONG [[id number]]  . gabapentin (NEURONTIN) 300 MG capsule    Sig: Take 1 capsule (300 mg total) by mouth at bedtime.    Dispense:  30 capsule    Refill:  5    [facility]    Order Specific Question:   Supervising Provider    Answer:   Hoy RegisterNEWLIN, ENOBONG [[id number]]  . atenolol (TENORMIN) 25 MG tablet    Sig: Take 0.5 tablets (12.5 mg total) by mouth daily.    Dispense:  30 tablet    Refill:  11    [facility]    Order Specific Question:   Supervising Provider    Answer:   Hoy RegisterNEWLIN, ENOBONG [[id number]]    Follow-up: Return in about 2 months (around 01/09/2018) for Thyroid level and DM.   Loletta Specteroger David Julie-Anne Torain PA

## 2017-11-09 ENCOUNTER — Telehealth (INDEPENDENT_AMBULATORY_CARE_PROVIDER_SITE_OTHER): Payer: Self-pay

## 2017-11-09 LAB — THYROTROPIN RECEPTOR AUTOABS: Thyrotropin Receptor Ab: 3.32 IU/L — ABNORMAL HIGH (ref 0.00–1.75)

## 2017-11-09 NOTE — Telephone Encounter (Signed)
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 I am sending her to an endocrinologist.

## 2017-11-09 NOTE — Telephone Encounter (Signed)
Left voicemail informing patient that meter has been sent to [facility]. Lisa Moreno, CMA

## 2017-11-09 NOTE — Telephone Encounter (Signed)
Left patient a voicemail notifying of results indicating she most likely has grave's disease as the cause of her hyperthyroidism and a referral has been placed to endocrinology; endocrinology office will contact directly to schedule appointment. Call [facility] with any questions or concerns. Lisa Moreno S Tadhg Eskew, CMA

## 2017-11-13 ENCOUNTER — Ambulatory Visit (HOSPITAL_COMMUNITY): Payer: Self-pay

## 2017-12-04 MED FILL — ATENOLOL 25 MG TABLET: 25 | 30 days supply | Qty: 15 | Fill #0

## 2017-12-05 ENCOUNTER — Ambulatory Visit (HOSPITAL_COMMUNITY)
Admission: RE | Admit: 2017-12-05 | Discharge: 2017-12-05 | Disposition: A | Discharge status: Home / Self Care | Payer: Self-pay | Source: Ambulatory Visit | Attending: Physician Assistant | Admitting: Physician Assistant

## 2017-12-05 DIAGNOSIS — E0520 Thyrotoxicosis with toxic multinodular goiter without thyrotoxic crisis or storm: Secondary | ICD-10-CM | POA: Insufficient documentation

## 2017-12-05 DIAGNOSIS — E0590 Thyrotoxicosis, unspecified without thyrotoxic crisis or storm: Secondary | ICD-10-CM

## 2017-12-06 ENCOUNTER — Telehealth (INDEPENDENT_AMBULATORY_CARE_PROVIDER_SITE_OTHER): Payer: Self-pay

## 2017-12-06 NOTE — Telephone Encounter (Signed)
-----   Message from Loletta Specteroger David Gomez, PA-C sent at 12/05/2017  5:57 PM EDT ----- Thyroid US shows that patient should only be monitored for now. Next thyroid US in one year.

## 2017-12-06 NOTE — Telephone Encounter (Signed)
Call placed using [employer] interpreter [phone]) left voicemail notifying patient that thyroid ultrasound shows that you should only be monitored for now; next thyroid US in one year. Please call [facility] at [phone] with any questions or concerns. Maryjean Mornempestt S Roberts, CMA

## 2018-01-08 ENCOUNTER — Ambulatory Visit (INDEPENDENT_AMBULATORY_CARE_PROVIDER_SITE_OTHER): Payer: Self-pay | Admitting: Physician Assistant

## 2018-01-08 ENCOUNTER — Encounter (INDEPENDENT_AMBULATORY_CARE_PROVIDER_SITE_OTHER): Payer: Self-pay | Admitting: Physician Assistant

## 2018-01-08 ENCOUNTER — Other Ambulatory Visit: Payer: Self-pay

## 2018-01-08 VITALS — BP 127/87 | HR 88 | Temp 98.3°F | Ht 64.0 in | Wt 224.4 lb

## 2018-01-08 DIAGNOSIS — E0590 Thyrotoxicosis, unspecified without thyrotoxic crisis or storm: Secondary | ICD-10-CM

## 2018-01-08 DIAGNOSIS — R1031 Right lower quadrant pain: Secondary | ICD-10-CM

## 2018-01-08 DIAGNOSIS — R81 Glycosuria: Secondary | ICD-10-CM

## 2018-01-08 DIAGNOSIS — K5900 Constipation, unspecified: Secondary | ICD-10-CM

## 2018-01-08 DIAGNOSIS — E1142 Type 2 diabetes mellitus with diabetic polyneuropathy: Secondary | ICD-10-CM

## 2018-01-08 LAB — POCT URINALYSIS DIPSTICK
BILIRUBIN UA: NEGATIVE
GLUCOSE UA: POSITIVE — AB
KETONES UA: NEGATIVE
Leukocytes, UA: NEGATIVE
Nitrite, UA: NEGATIVE
PH UA: 5.5 (ref 5.0–8.0)
Protein, UA: NEGATIVE
RBC UA: NEGATIVE
Spec Grav, UA: >=1.03 — AB (ref 1.010–1.025)
UROBILINOGEN UA: 0.2 U/dL

## 2018-01-08 LAB — POCT GLYCOSYLATED HEMOGLOBIN (HGB A1C): Hemoglobin A1C: 6.5 % — AB (ref 4.0–5.6)

## 2018-01-08 LAB — POCT URINE PREGNANCY: Preg Test, Ur: NEGATIVE

## 2018-01-08 MED ORDER — DOCUSATE SODIUM 50 MG PO CAPS: 50.0000 mg | ORAL_CAPSULE | Freq: Two times a day (BID) | ORAL | 0 refills | Status: DC

## 2018-01-08 MED ORDER — POLYETHYLENE GLYCOL 3350 17 GM/SCOOP PO POWD: 17.0000 g | Freq: Every day | ORAL | 0 refills | Status: DC

## 2018-01-08 NOTE — Progress Notes (Signed)
Subjective:  Patient ID: Lisa Moreno, female    DOB: April 22, 1976  Age: 42 y.o. MRN: [id number]  CC: f/u hyperthyroidism and DM  HPI Lisa Moreno Lisa Moreno is a 42 y.o. female with a medical history of asthma, DM2, GERD, HTN, appendectomy, and total abdominal hysterectomy/bilateral oophorepexy 2016 presents to f/u on hyperthyroidism and DM2. A1c 6.9% on 10/14/17. TSH <0.010 and T4 2.13 three months ago. Patient taking methimazole 15 mg daily and half a tab of metformin 500 mg qday; original order was 500 mg BID. Reduced metformin on her own accord due to feeling hypoglycemic. Does not check her blood sugar at home. Has changed diet to include more vegetables and low carbs. Does not exercise due to having a busy day. Pt has an elevated thyrotropin receptor autoabs and found to have thyroid nodules. Was referred to endocrinology. Pt has endocrinology appointment at [facility] on 02/27/18.     Main complaint today is of RLQ abdominal pain that radiates inferiorly to the anterior and lateral right thigh since one and a half weeks ago. Attributed to carrying her child of 30 lbs. Patient is sexually active but reports having a hysterectomy. Had an appendectomy in 2017. Pt also reports constipation. Has not taken laxatives. Does not endorse any other GI sxs or constitutional sxs.      Outpatient Medications Prior to Visit  Medication Sig Dispense Refill  . atenolol (TENORMIN) 25 MG tablet Take 0.5 tablets (12.5 mg total) by mouth daily. 30 tablet 11  . gabapentin (NEURONTIN) 300 MG capsule Take 1 capsule (300 mg total) by mouth at bedtime. 30 capsule 5  . metFORMIN (GLUCOPHAGE) 500 MG tablet Take 1 tablet (500 mg total) by mouth 2 (two) times daily with a meal. 30 tablet 11  . methimazole (TAPAZOLE) 5 MG tablet Take 3 tablets (15 mg total) by mouth daily. 90 tablet 3  . Blood Glucose Monitoring Suppl (TRUE METRIX METER) DEVI 1 each by Does not apply route daily. (Patient not taking: Reported  on 01/08/2018) 1 Device 0  . glucose blood test strip Use TID (Patient not taking: Reported on 01/08/2018) 100 each 11  . Lancets MISC 1 each by Does not apply route 3 (three) times daily. (Patient not taking: Reported on 01/08/2018) 100 each 11   No facility-administered medications prior to visit.      ROS Review of Systems  Constitutional: Negative for chills, fever and malaise/fatigue.  Eyes: Negative for blurred vision.  Respiratory: Negative for shortness of breath.   Cardiovascular: Negative for chest pain and palpitations.  Gastrointestinal: Negative for abdominal pain and nausea.  Genitourinary: Negative for dysuria and hematuria.  Musculoskeletal: Negative for joint pain and myalgias.  Skin: Negative for rash.  Neurological: Negative for tingling and headaches.  Psychiatric/Behavioral: Negative for depression. The patient is not nervous/anxious.     Objective:  BP 127/87 (BP Location: Left Arm, Patient Position: Sitting, Cuff Size: Large)   Pulse 88   Temp 98.3 F (36.8 C) (Oral)   Ht 5\' 4"  (1.626 m)   Wt 224 lb 6.4 oz (101.8 kg)   LMP 08/05/2014   SpO2 96%   BMI 38.52 kg/m   BP/Weight 01/08/2018 11/08/2017 10/25/2017  Systolic BP 127 125 144  Diastolic BP 87 80 78  Wt. (Lbs) 224.4 221.2 -  BMI 38.52 37.97 -      Physical Exam  Constitutional: She is oriented to person, place, and time.  Well developed, well nourished, NAD, polite  HENT:  Head: Normocephalic and atraumatic.  Eyes: No scleral icterus.  Neck: Normal range of motion. Neck supple. Thyromegaly present.  Cardiovascular: Normal rate, regular rhythm and normal heart sounds.  Pulmonary/Chest: Effort normal and breath sounds normal.  Abdominal: Soft. Bowel sounds are normal. She exhibits no distension and no mass. There is tenderness (RLQ TTP). There is rebound. There is no guarding. No hernia.  Negative obturator, psoas, and Rovsing's sign.  Musculoskeletal: She exhibits no edema.  Neurological: She  is alert and oriented to person, place, and time.  Skin: Skin is warm and dry. No rash noted. No erythema. No pallor.  Psychiatric: She has a normal mood and affect. Her behavior is normal. Thought content normal.  Vitals reviewed.    Assessment & Plan:    1. Right lower quadrant abdominal pain - CBC with Differential - Urinalysis Dipstick with 250 g/dL - POCT urine pregnancy  2. Hyperthyroidism - Thyroid Panel With TSH  3. Type 2 diabetes mellitus with diabetic polyneuropathy, without long-term current use of insulin (HCC) - Basic Metabolic Panel - HgB A1c 6.5% down from 6.9% three months ago.  - Microalbumin / creatinine urine ratio  4. Glycosuria - Suspect due to hyperthyroidism - Thyroid Panel With TSH  5. Constipation, unspecified constipation type - Begin polyethylene glycol powder (GLYCOLAX/MIRALAX) powder; Take 17 g by mouth daily. Mix with 8 oz of water.  Dispense: 119 g; Refill: 0 - Begin docusate sodium (COLACE) 50 MG capsule; Take 1 capsule (50 mg total) by mouth 2 (two) times daily.  Dispense: 14 capsule; Refill: 0   Meds ordered this encounter  Medications  . polyethylene glycol powder (GLYCOLAX/MIRALAX) powder    Sig: Take 17 g by mouth daily. Mix with 8 oz of water.    Dispense:  119 g    Refill:  0    Order Specific Question:   Supervising Provider    Answer:   Hoy RegisterNEWLIN, ENOBONG [[id number]]  . docusate sodium (COLACE) 50 MG capsule    Sig: Take 1 capsule (50 mg total) by mouth 2 (two) times daily.    Dispense:  14 capsule    Refill:  0    Order Specific Question:   Supervising Provider    Answer:   Hoy RegisterNEWLIN, ENOBONG [[id number]]    Follow-up: Return in about 12 weeks (around 04/02/2018) for Thyroid and DM.   Loletta Specteroger David Furkan Keenum PA

## 2018-01-08 NOTE — Patient Instructions (Signed)
Hyperthyroidism Hyperthyroidism is when the thyroid is too active (overactive). Your thyroid is a large gland that is located in your neck. The thyroid helps to control how your body uses food (metabolism). When your thyroid is overactive, it produces too much of a hormone called thyroxine. What are the causes? Causes of hyperthyroidism may include:  Graves disease. This is when your immune system attacks the thyroid gland. This is the most common cause.  Inflammation of the thyroid gland.  Tumor in the thyroid gland or somewhere else.  Excessive use of thyroid medicines, including: ? Prescription thyroid supplement. ? Herbal supplements that mimic thyroid hormones.  Solid or fluid-filled lumps within your thyroid gland (thyroid nodules).  Excessive ingestion of iodine.  What increases the risk?  Being female.  Having a family history of thyroid conditions. What are the signs or symptoms? Signs and symptoms of hyperthyroidism may include:  Nervousness.  Inability to tolerate heat.  Unexplained weight loss.  Diarrhea.  Change in the texture of hair or skin.  Heart skipping beats or making extra beats.  Rapid heart rate.  Loss of menstruation.  Shaky hands.  Fatigue.  Restlessness.  Increased appetite.  Sleep problems.  Enlarged thyroid gland or nodules.  How is this diagnosed? Diagnosis of hyperthyroidism may include:  Medical history and physical exam.  Blood tests.  Ultrasound tests.  How is this treated? Treatment may include:  Medicines to control your thyroid.  Surgery to remove your thyroid.  Radiation therapy.  Follow these instructions at home:  Take medicines only as directed by your health care provider.  Do not use any tobacco products, including cigarettes, chewing tobacco, or electronic cigarettes. If you need help quitting, ask your health care provider.  Do not exercise or do physical activity until your health care provider  approves.  Keep all follow-up appointments as directed by your health care provider. This is important. Contact a health care provider if:  Your symptoms do not get better with treatment.  You have fever.  You are taking thyroid replacement medicine and you: ? Have depression. ? Feel mentally and physically slow. ? Have weight gain. Get help right away if:  You have decreased alertness or a change in your awareness.  You have abdominal pain.  You feel dizzy.  You have a rapid heartbeat.  You have an irregular heartbeat. This information is not intended to replace advice given to you by your health care provider. Make sure you discuss any questions you have with your health care provider. Document Released: 04/10/2005 Document Revised: 09/09/2015 Document Reviewed: 08/26/2013 Elsevier Interactive Patient Education  2018 Elsevier Inc.  

## 2018-01-09 ENCOUNTER — Telehealth (INDEPENDENT_AMBULATORY_CARE_PROVIDER_SITE_OTHER): Payer: Self-pay

## 2018-01-09 ENCOUNTER — Other Ambulatory Visit (INDEPENDENT_AMBULATORY_CARE_PROVIDER_SITE_OTHER): Payer: Self-pay | Admitting: Physician Assistant

## 2018-01-09 DIAGNOSIS — K5900 Constipation, unspecified: Secondary | ICD-10-CM

## 2018-01-09 DIAGNOSIS — E0590 Thyrotoxicosis, unspecified without thyrotoxic crisis or storm: Secondary | ICD-10-CM

## 2018-01-09 LAB — BASIC METABOLIC PANEL
BUN / CREAT RATIO: 23 (ref 9–23)
BUN: 12 mg/dL (ref 6–24)
CHLORIDE: 100 mmol/L (ref 96–106)
CO2: 24 mmol/L (ref 20–29)
Calcium: 9.1 mg/dL (ref 8.7–10.2)
Creatinine, Ser: 0.53 mg/dL — ABNORMAL LOW (ref 0.57–1.00)
GFR, EST AFRICAN AMERICAN: 135 mL/min/{1.73_m2} (ref >59)
GFR, EST NON AFRICAN AMERICAN: 117 mL/min/{1.73_m2} (ref >59)
Glucose: 233 mg/dL — ABNORMAL HIGH (ref 65–99)
POTASSIUM: 3.9 mmol/L (ref 3.5–5.2)
Sodium: 139 mmol/L (ref 134–144)

## 2018-01-09 LAB — CBC WITH DIFFERENTIAL/PLATELET
BASOS: 0 %
Basophils Absolute: 0 10*3/uL (ref 0.0–0.2)
EOS (ABSOLUTE): 0.4 10*3/uL (ref 0.0–0.4)
Eos: 3 %
HEMOGLOBIN: 11.6 g/dL (ref 11.1–15.9)
Hematocrit: 35.4 % (ref 34.0–46.6)
IMMATURE GRANS (ABS): 0 10*3/uL (ref 0.0–0.1)
Immature Granulocytes: 0 %
LYMPHS ABS: 3.3 10*3/uL — AB (ref 0.7–3.1)
LYMPHS: 24 %
MCH: 25 pg — AB (ref 26.6–33.0)
MCHC: 32.8 g/dL (ref 31.5–35.7)
MCV: 76 fL — AB (ref 79–97)
MONOCYTES: 6 %
Monocytes Absolute: 0.8 10*3/uL (ref 0.1–0.9)
NEUTROS ABS: 9.1 10*3/uL — AB (ref 1.4–7.0)
Neutrophils: 67 %
Platelets: 291 10*3/uL (ref 150–450)
RBC: 4.64 x10E6/uL (ref 3.77–5.28)
RDW: 15.5 % — ABNORMAL HIGH (ref 12.3–15.4)
WBC: 13.6 10*3/uL — ABNORMAL HIGH (ref 3.4–10.8)

## 2018-01-09 LAB — THYROID PANEL WITH TSH
FREE THYROXINE INDEX: 5.3 — AB (ref 1.2–4.9)
T3 UPTAKE RATIO: 36 % (ref 24–39)
T4, Total: 14.6 ug/dL — ABNORMAL HIGH (ref 4.5–12.0)

## 2018-01-09 MED ORDER — METHIMAZOLE 5 MG PO TABS: 20.0000 mg | ORAL_TABLET | Freq: Every day | ORAL | 2 refills | Status: DC

## 2018-01-09 MED ORDER — POLYETHYLENE GLYCOL 3350 17 GM/SCOOP PO POWD: 17.0000 g | Freq: Every day | ORAL | 0 refills | Status: AC

## 2018-01-09 MED FILL — POLYETHYLENE GLYCOL 3350 PO: 14 days supply | Qty: 238 | Fill #0

## 2018-01-09 MED FILL — !TRUE METRIX BLOOD GLUCOSE: 365 days supply | Qty: 1 | Fill #0

## 2018-01-09 MED FILL — ATENOLOL 25 MG TABLET: 25 | 30 days supply | Qty: 15 | Fill #1

## 2018-01-09 MED FILL — TRUE METRIX TEST STRIP: 30 days supply | Qty: 100 | Fill #0

## 2018-01-09 MED FILL — metFORMIN HCL 500 MG TABS: 500 | 30 days supply | Qty: 60 | Fill #1

## 2018-01-09 MED FILL — methIMAzole 5 MG TABS: 5 | 30 days supply | Qty: 120 | Fill #0

## 2018-01-09 MED FILL — TRUEplus LANCETS 28G MISC: 30 days supply | Qty: 100 | Fill #0

## 2018-01-09 NOTE — Telephone Encounter (Signed)
Patient is aware that thyroid hormone level is mildly elevated. Take 20mg  of methimazole instead of 15. Awaiting endocrinology appointment. Patient was informed that referral was sent to [facility]. She stated she is going to call them herself to see how soon she can be scheduled. Maryjean Mornempestt S Prynce Jacober, CMA

## 2018-01-09 NOTE — Telephone Encounter (Signed)
-----   Message from Loletta Specteroger David Gomez, PA-C sent at 01/09/2018  9:45 AM EDT ----- Pt's thyroid hormone level is mildly elevated. I suggest that she begin to take 20 mg of methimazole instead of 15 mg. We will await endocrinology appointment in the meantime.

## 2018-01-10 ENCOUNTER — Telehealth (INDEPENDENT_AMBULATORY_CARE_PROVIDER_SITE_OTHER): Payer: Self-pay

## 2018-01-10 LAB — MICROALBUMIN / CREATININE URINE RATIO
CREATININE, UR: 130.9 mg/dL
MICROALB/CREAT RATIO: 5.6 mg/g{creat} (ref 0.0–30.0)
MICROALBUM., U, RANDOM: 7.3 ug/mL

## 2018-01-10 NOTE — Telephone Encounter (Signed)
Patient is aware of normal urinary proteins. Toby Breithaupt S Sequoya Hogsett, CMA  

## 2018-01-10 NOTE — Telephone Encounter (Signed)
-----   Message from Loletta Specteroger David Gomez, PA-C sent at 01/10/2018 11:55 AM EDT ----- Urinary proteins normal.

## 2018-02-21 MED FILL — ATENOLOL 25 MG TABLET: 25 | 30 days supply | Qty: 15 | Fill #2

## 2018-02-21 MED FILL — methIMAzole 5 MG TABS: 5 | 30 days supply | Qty: 120 | Fill #1

## 2018-02-27 ENCOUNTER — Ambulatory Visit: Payer: Self-pay | Admitting: Endocrinology

## 2018-02-27 ENCOUNTER — Encounter: Payer: Self-pay | Admitting: Endocrinology

## 2018-02-27 ENCOUNTER — Ambulatory Visit (INDEPENDENT_AMBULATORY_CARE_PROVIDER_SITE_OTHER): Payer: Self-pay | Admitting: Endocrinology

## 2018-02-27 VITALS — BP 128/76 | HR 70 | Ht 64.0 in | Wt 225.4 lb

## 2018-02-27 DIAGNOSIS — E0590 Thyrotoxicosis, unspecified without thyrotoxic crisis or storm: Secondary | ICD-10-CM

## 2018-02-27 LAB — T4, FREE: FREE T4: 1.76 ng/dL — AB (ref 0.60–1.60)

## 2018-02-27 LAB — T3, FREE: T3 FREE: 5.3 pg/mL — AB (ref 2.3–4.2)

## 2018-02-27 LAB — TSH: TSH: <0.01 u[IU]/mL — ABNORMAL LOW (ref 0.35–4.50)

## 2018-02-27 NOTE — Progress Notes (Signed)
Patient ID: Lisa Moreno, female   DOB: Nov 22, 1975, 42 y.o.   MRN: [id number]                                                                                                              Reason for Appointment:  Hyperthyroidism, new consultation  Referring healthcare provider: Sindy Messing   Chief complaint: Tiredness   History of Present Illness:   Patient was admitted to the hospital in June for fatigue and lightheadedness She was also having symptoms of palpitations, shakiness, feeling excessively warm, irritability, nervousness, and blurring of her vision and dry eyes .  The patient had lost about 20 Lbs since these symptoms started  Was found to be hyperthyroid on evaluation and discharged home on atenolol and methimazole  Overall she is feeling better with her symptoms and has no further weight loss She does not feel unusually hot but at times will feel cold also However she thinks she still gets tired and at times sleepy She says that she is still taking atenolol because it helps reduce palpitations when she is more active and anxious, on weekends when she is more alert she takes only half a tablet  At times will also feel a little dizzy and still has some dryness of her eyes  Wt Readings from Last 3 Encounters:  02/27/18 225 lb 6.4 oz (102.2 kg)  01/08/18 224 lb 6.4 oz (101.8 kg)  11/08/17 221 lb 3.2 oz (100.3 kg)     Treatments so far: Methimazole 20 mg daily  Thyroid function tests as follows:     Lab Results  Component Value Date   FREET4 2.13 (H) 10/15/2017   FREET4 0.99 05/05/2015   TSH <0.006 (L) 01/08/2018   TSH <0.010 (L) 10/15/2017   TSH <0.010 (L) 10/15/2017    Lab Results  Component Value Date   THYROTRECAB 3.32 (H) 11/08/2017     Allergies as of 02/27/2018      Reactions   Decadron [dexamethasone] Other (See Comments)   Intense burning vaginal/rectal area.       Medication List        Accurate as of 02/27/18  4:04 PM. Always  use your most recent med list.          atenolol 25 MG tablet Commonly known as:  TENORMIN Take 0.5 tablets (12.5 mg total) by mouth daily.   docusate sodium 50 MG capsule Commonly known as:  COLACE Take 1 capsule (50 mg total) by mouth 2 (two) times daily.   gabapentin 300 MG capsule Commonly known as:  NEURONTIN Take 1 capsule (300 mg total) by mouth at bedtime.   glucose blood test strip Use TID   Lancets Misc 1 each by Does not apply route 3 (three) times daily.   metFORMIN 500 MG tablet Commonly known as:  GLUCOPHAGE Take 1 tablet (500 mg total) by mouth 2 (two) times daily with a meal.   methimazole 5 MG tablet Commonly known as:  TAPAZOLE Take 4 tablets (20 mg total)  by mouth daily.   TRUE METRIX METER Devi 1 each by Does not apply route daily.           Past Medical History:  Diagnosis Date  . Advanced maternal age in multigravida   . Asthma   . Cough 06/16/2015   Over two months coughing paroxysms; no focal findings on lung exam. Feels ill. CXR ordered today. Empiric treatment atypicals with macrolide.    . Diabetes mellitus without complication (HCC)   . GERD (gastroesophageal reflux disease)   . Gestational diabetes   . Hypertension   . Hypertension affecting pregnancy, antepartum 11/09/2014   Baseline Protein Creatinine ratio =0.12;   Baseline 24 hour urine = 11 Baseline labs 24 hour urine    . Placenta previa centralis 12/10/2014   Central Previa--vaginal rest. Scheduled CS F/U US with MFM   . Pregnancy with type 2 diabetes mellitus in second trimester 11/09/2014   Class C Needs baseline labs [x]  Cr-wnl [x]  TSH-wnl in 2015 [x]  HgbA1C 5.9% [x]  24 hr urine=16 [x]  Baby ASA [x]  Fetal ECHO-8/30 normal [ ]  optho [ ]  serial u/s for growt [ ]  2x/wk testing at 32 wks     . Rapid heart beat 11/09/2014   Cardiology referral sue to heart palpitations and some chest pain. Obesity, DM, HTN   . 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  Rubella:  Immune  GTT  N/A, Class C DM RPR:   NR  Flu vaccine  HBsAg:   NEG  TDa    Past Surgical History:  Procedure Laterality Date  . ABDOMINAL HYSTERECTOMY    . BREAST REDUCTION SURGERY    . ovarian torsion    . TONSILLECTOMY      Family History  Problem Relation Age of Onset  . Diabetes Mother   . Stroke Mother   . Hypertension Mother   . Depression Mother   . Diabetes Father   . Stroke Father   . Kidney disease Father   . Hypertension Father     Social History:  reports that she has never smoked. She has never used smokeless tobacco. She reports that she does not drink alcohol or use drugs.  Allergies:  Allergies  Allergen Reactions  . Decadron [Dexamethasone] Other (See Comments)    Intense burning vaginal/rectal area.      Review of Systems  Constitutional: Negative for weight loss.  Eyes: Positive for blurred vision.  Respiratory: Negative for shortness of breath.   Cardiovascular: Positive for palpitations. Negative for chest pain.       Palpitations controlled with atenolol  Gastrointestinal: Negative for diarrhea.  Endocrine: Positive for fatigue.  Musculoskeletal: Negative for joint pain.  Skin:       No hair loss  Neurological: Negative for balance difficulty.  Psychiatric/Behavioral: Positive for nervousness and insomnia.       She is irritable and gets angry easily   She has had mild diabetes Has been prescribed metformin but she takes it only when she goes off her diet on weekends She says her blood sugars are usually between 98-114  Lab Results  Component Value  Date   HGBA1C 6.5 (A) 01/08/2018   HGBA1C 6.9 (H) 10/14/2017   HGBA1C 5.5  06/16/2015   Lab Results  Component Value Date   MICROALBUR 1.7 06/16/2015   LDLCALC 72 10/14/2017   CREATININE 0.53 (L) 01/08/2018       Examination:   BP 128/76 (BP Location: Left Arm, Patient Position: Sitting, Cuff Size: Large)   Pulse 70   Ht 5\' 4"  (1.626 m)   Wt 225 lb 6.4 oz (102.2 kg)   LMP 08/05/2014   SpO2 98%   BMI 38.69 kg/m    General Appearance:  She has moderate generalized obesity Pleasant, not anxious or hyperkinetic.         Eyes: No abnormal prominence, lid lag or stare present.  No swelling of the eyelids   Neck: The thyroid is enlarged about 1-1/2 times normal, smooth, slightly tender and diffuse, better felt on the right side.  There is no lymphadenopathy in the neck .           Heart: normal S1 and S2, no murmurs .          Lungs: breath sounds are normal bilaterally without added sounds  Abdomen: no hepatosplenomegaly or other palpable abnormality  Extremities: hands are warm. No ankle edema.  Neurological:  Bilateral fine tremors are minimally present. Deep tendon reflexes at biceps are appearing normal, not able to elicit at the ankles.  Skin: No rash, abnormal thickening of the skin on the lower legs seen     Assessment/Plan:   Hyperthyroidism,  from Graves' disease   She has had moderate symptoms which are mostly controlled with antithyroid drugs, currently taking 20 mg methimazole since June She does not have a significant goiter Graves' disease confirmed with high thyrotropin receptor antibody  However she still has some nonspecific symptoms of fatigue, somnolence and irritability and not clear if this is related to hyperthyroidism No recent labs available except TSH in September   Discussed with the patient the hyperthyroidism is a result of an autoimmune condition involving the thyroid.  Explained that the options for treatment are basically antithyroid drugs and radioactive iodine.  Discussed the pros and cons for each  treatment: Antithyroid drugs would be reasonable for mild disease but would need frequent followup with lab monitoring as well as potential for side effects from the medications and uncertainty about long-term cure of the problem Discussed that I-131 treatment is safe and simple to do but will result in long-term hypothyroidism that will result from ablation of the thyroid tissue and the need for lifelong supplementation and periodic monitoring.  Clinically would be appropriate for the patient to be treated with antithyroid drugs at this time with methimazole especially since she has a small child at home and she is somewhat apprehensive about thyroid ablation  Patient handout on hyperthyroidism including treatment with radioactive iodine treatment given  Patient understands the above discussion and treatment options. All questions were answered satisfactorily  Consult note sent to referring physician  Reather Littler 02/27/2018, 4:04 PM    Note: This office note was prepared with Dragon voice recognition system technology. Any transcriptional errors that result from this process are unintentional.  ADDENDUM: Labs indicate continued hyperthyroidism and she will need to go up to 30 mg daily on methimazole

## 2018-02-28 ENCOUNTER — Other Ambulatory Visit: Payer: Self-pay

## 2018-02-28 MED ORDER — METHIMAZOLE 10 MG PO TABS: ORAL_TABLET | ORAL | 5 refills | Status: DC

## 2018-03-23 ENCOUNTER — Encounter (HOSPITAL_COMMUNITY): Payer: Self-pay | Admitting: Emergency Medicine

## 2018-03-23 ENCOUNTER — Emergency Department (HOSPITAL_COMMUNITY): Payer: Self-pay

## 2018-03-23 ENCOUNTER — Emergency Department (HOSPITAL_COMMUNITY)
Admission: EM | Admit: 2018-03-23 | Discharge: 2018-03-24 | Disposition: A | Discharge status: Home / Self Care | Payer: Self-pay | Attending: Emergency Medicine | Admitting: Emergency Medicine

## 2018-03-23 ENCOUNTER — Other Ambulatory Visit: Payer: Self-pay

## 2018-03-23 DIAGNOSIS — E079 Disorder of thyroid, unspecified: Secondary | ICD-10-CM | POA: Insufficient documentation

## 2018-03-23 DIAGNOSIS — J209 Acute bronchitis, unspecified: Secondary | ICD-10-CM | POA: Insufficient documentation

## 2018-03-23 DIAGNOSIS — I10 Essential (primary) hypertension: Secondary | ICD-10-CM | POA: Insufficient documentation

## 2018-03-23 DIAGNOSIS — R05 Cough: Secondary | ICD-10-CM | POA: Insufficient documentation

## 2018-03-23 DIAGNOSIS — E119 Type 2 diabetes mellitus without complications: Secondary | ICD-10-CM | POA: Insufficient documentation

## 2018-03-23 DIAGNOSIS — Z79899 Other long term (current) drug therapy: Secondary | ICD-10-CM | POA: Insufficient documentation

## 2018-03-23 DIAGNOSIS — Z7984 Long term (current) use of oral hypoglycemic drugs: Secondary | ICD-10-CM | POA: Insufficient documentation

## 2018-03-23 LAB — CBG MONITORING, ED: GLUCOSE-CAPILLARY: 184 mg/dL — AB (ref 70–99)

## 2018-03-23 MED ORDER — IPRATROPIUM-ALBUTEROL 0.5-2.5 (3) MG/3ML IN SOLN
3.0000 mL | Freq: Once | RESPIRATORY_TRACT | Status: AC
  Administered 2018-03-23: 3 mL via RESPIRATORY_TRACT
  Filled 2018-03-23: qty 3

## 2018-03-23 MED ORDER — PREDNISONE 20 MG PO TABS
60.0000 mg | ORAL_TABLET | Freq: Once | ORAL | Status: AC
  Administered 2018-03-23: 60 mg via ORAL
  Filled 2018-03-23: qty 3

## 2018-03-23 NOTE — ED Triage Notes (Signed)
Patient from home, c/o sob, fatigue and non productive cough since yesterday. Hx of asthma-patient tried inhaler, steroids and breathing treatment PTA with no relief. No c/o CP, NAD.

## 2018-03-23 NOTE — ED Provider Notes (Signed)
[facility] Provider Note   CSN: [id number] Arrival date & time: 03/23/18  2221     History   Chief Complaint Chief Complaint  Patient presents with  . Shortness of Breath    HPI Lisa Moreno is a 42 y.o. female.  The history is provided by the patient.  She has a history of hypertension, diabetes, asthma and comes in with 2-day history of cough which is nonproductive.  She feels that she raises sputum up to her throat, but cannot actually cough it out.  She denies fever, chills, sweats.  She is short of breath and has been using her home inhaler and nebulizer which give very temporary relief.  She has had sick contact.  She is a non-smoker.  She also took a dose of her child's medicine this afternoon and she thinks it was a steroid but cannot remember the name.  Past Medical History:  Diagnosis Date  . Advanced maternal age in multigravida   . Asthma   . Cough 06/16/2015   Over two months coughing paroxysms; no focal findings on lung exam. Feels ill. CXR ordered today. Empiric treatment atypicals with macrolide.    . Diabetes mellitus without complication (HCC)   . GERD (gastroesophageal reflux disease)   . Gestational diabetes   . Hypertension   . Hypertension affecting pregnancy, antepartum 11/09/2014   Baseline Protein Creatinine ratio =0.12;   Baseline 24 hour urine = 11 Baseline labs 24 hour urine    . Placenta previa centralis 12/10/2014   Central Previa--vaginal rest. Scheduled CS F/U US with MFM   . Pregnancy with type 2 diabetes mellitus in second trimester 11/09/2014   Class C Needs baseline labs [x]  Cr-wnl [x]  TSH-wnl in 2015 [x]  HgbA1C 5.9% [x]  24 hr urine=16 [x]  Baby ASA [x]  Fetal ECHO-8/30 normal [ ]  optho [ ]  serial u/s for growt [ ]  2x/wk testing at 32 wks     . Rapid heart beat 11/09/2014   Cardiology referral sue to heart palpitations and some chest pain. Obesity, DM, HTN   . 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  Rubella:  Immune  GTT  N/A, Class C DM RPR:   NR  Flu vaccine  HBsAg:   NEG  TDa    Patient Active Problem List   Diagnosis Date Noted  . Diabetic polyneuropathy associated with type 2 diabetes mellitus (HCC) 11/08/2017  . Medication refill 11/08/2017  . DM (diabetes mellitus) type 2, uncontrolled, with ketoacidosis (HCC)   . Hyperthyroidism   . Postural dizziness with presyncope   . Dizziness 10/14/2017  . Microcytic anemia 06/16/2015  . SOB (shortness of breath) 05/05/2015  . PND (paroxysmal nocturnal dyspnea) 05/05/2015  . Fatigue 05/05/2015  . HTN (hypertension) 12/11/2014  . Abnormal findings on antenatal screening   . Type 2 diabetes mellitus without complication (HCC) 03/30/2014  . Morbid obesity (HCC) 03/30/2014  . Fibromyalgia 01/08/2013    Past Surgical History:  Procedure Laterality Date  . ABDOMINAL HYSTERECTOMY    . BREAST REDUCTION SURGERY    . ovarian torsion    .  TONSILLECTOMY       OB History    Gravida  3   Para  2   Term  2   Preterm  0   AB  0   Living  2     SAB  0   TAB  0   Ectopic  0   Multiple  0   Live Births  1            Home Medications    Prior to Admission medications   Medication Sig Start Date End Date Taking? Authorizing Provider  acetaminophen (TYLENOL) 500 MG tablet Take 500 mg by mouth every 6 (six) hours as needed.    [provider]  atenolol (TENORMIN) 25 MG tablet Take 0.5 tablets (12.5 mg total) by mouth daily. 11/08/17   Loletta Specter, PA-C  Blood Glucose Monitoring Suppl (TRUE METRIX METER) DEVI 1 each by Does not apply route daily. 11/08/17   Loletta Specter, PA-C  docusate sodium (COLACE) 50 MG capsule Take 1 capsule (50 mg total) by mouth 2 (two) times daily. Patient not taking: Reported on 02/27/2018 01/08/18   Loletta Specter, PA-C  doxylamine, Sleep, (UNISOM) 25 MG tablet Take 25 mg by mouth at bedtime as needed.    [provider]  gabapentin (NEURONTIN) 300 MG capsule Take 1 capsule (300 mg total) by mouth at bedtime. Patient taking differently: Take 900 mg by mouth at bedtime.  11/08/17   Loletta Specter, PA-C  glucose blood test strip Use TID 11/08/17   Loletta Specter, PA-C  Lancets MISC 1 each by Does not apply route 3 (three) times daily. 11/08/17   Loletta Specter, PA-C  metFORMIN (GLUCOPHAGE) 500 MG tablet Take 1 tablet (500 mg total) by mouth 2 (two) times daily with a meal. 11/08/17   Loletta Specter, PA-C  methimazole (TAPAZOLE) 10 MG tablet Take 1 1/2 tablets twice daily 02/28/18   Reather Littler, MD  polyethylene glycol (MIRALAX / GLYCOLAX) packet Take 17 g by mouth daily.    [provider]    Family History Family History  Problem Relation Age of Onset  . Diabetes Mother   . Stroke Mother   . Hypertension Mother   . Depression Mother   . Thyroid disease Mother   . Diabetes Father   . Stroke Father   . Kidney disease Father   . Hypertension Father     Social History Social History   Tobacco Use  . Smoking status: Never Smoker  . Smokeless tobacco: Never Used  Substance Use Topics  . Alcohol use: No  . Drug use: No     Allergies   Decadron [dexamethasone]   Review of Systems Review of Systems  All other systems reviewed and are negative.    Physical Exam Updated Vital Signs BP 134/79   Pulse 84   Temp 99 F (37.2 C) (Oral)   Resp 15   Ht 5\' 4"  (1.626 m)   Wt 102.2 kg   LMP 08/05/2014   SpO2 95%   BMI 38.67 kg/m   Physical Exam  Nursing note and vitals reviewed.  42 year old female, resting comfortably and in no acute distress. Vital signs are normal. Oxygen  saturation is 95%, which is normal. Head is normocephalic and atraumatic. PERRLA, EOMI. Oropharynx is clear. Neck is nontender and supple without adenopathy or JVD. Back is nontender and there is no CVA tenderness. Lungs have a prolonged exhalation phase without overt  rales, wheezes, or rhonchi. Chest is nontender. Heart has regular rate and rhythm without murmur. Abdomen is soft, flat, nontender without masses or hepatosplenomegaly and peristalsis is normoactive. Extremities have no cyanosis or edema, full range of motion is present. Skin is warm and dry without rash. Neurologic: Mental status is normal, cranial nerves are intact, there are no motor or sensory deficits.   ED Treatments / Results  Labs (all labs ordered are listed, but only abnormal results are displayed) Labs Reviewed  CBG MONITORING, ED - Abnormal; Notable for the following components:      Result Value   Glucose-Capillary 184 (*)    All other components within normal limits    EKG EKG Interpretation  Date/Time:  Saturday March 23 2018 22:34:05 EST Ventricular Rate:  92 PR Interval:    QRS Duration: 97 QT Interval:  369 QTC Calculation: 457 R Axis:   -24 Text Interpretation:  Sinus rhythm Borderline left axis deviation no significant change since June 2019 Confirmed by Pricilla Loveless [phone]) on 03/23/2018 10:45:41 PM   Radiology Dg Chest 2 View  Result Date: 03/23/2018 CLINICAL DATA:  Cough and dyspnea. EXAM: CHEST - 2 VIEW COMPARISON:  Radiographs 04/19/2015 FINDINGS: The cardiomediastinal contours are normal. The lungs are clear. Pulmonary vasculature is normal. No consolidation, pleural effusion, or pneumothorax. No acute osseous abnormalities are seen. IMPRESSION: No acute pulmonary process. Electronically Signed   By: Narda Rutherford M.D.   On: 03/23/2018 23:20    Procedures Procedures  Medications Ordered in ED Medications  ipratropium-albuterol (DUONEB) 0.5-2.5 (3) MG/3ML nebulizer  solution 3 mL (3 mLs Nebulization Given 03/23/18 2336)  predniSONE (DELTASONE) tablet 60 mg (60 mg Oral Given 03/23/18 2344)     Initial Impression / Assessment and Plan / ED Course  I have reviewed the triage vital signs and the nursing notes.  Pertinent labs & imaging results that were available during my care of the patient were reviewed by me and considered in my medical decision making (see chart for details).  Acute bronchitis.  Patient is nontoxic in appearance and is not in any respiratory distress.  Will check chest x-ray.  ECG is unremarkable.  She will be given nebulizer treatment with albuterol and ipratropium and will be started on steroids.  She is given an initial dose of prednisone.  With diabetes history, will check CBG.  Old records are reviewed, and she does have prior ED visits for asthma.  Glucose is 184.  Chest x-ray shows no evidence of pneumonia.  She had modest improvement following nebulizer treatment.  She is discharged with prescription for prednisone for 4 days, and also given prescription for a small number of hydrocodone-acetaminophen tablets to use to suppress cough at night so she can sleep.  Return precautions discussed.  Final Clinical Impressions(s) / ED Diagnoses   Final diagnoses:  Acute bronchitis, unspecified organism    ED Discharge Orders         Ordered    predniSONE (DELTASONE) 20 MG tablet     03/24/18 0003    HYDROcodone-acetaminophen (NORCO) 5-325 MG tablet  Every 4 hours PRN     03/24/18 0003           Dione Booze, MD 03/24/18 0006

## 2018-03-24 MED ORDER — HYDROCODONE-ACETAMINOPHEN 5-325 MG PO TABS: 1.0000 | ORAL_TABLET | ORAL | 0 refills | Status: DC | PRN

## 2018-03-24 MED ORDER — PREDNISONE 20 MG PO TABS: ORAL_TABLET | ORAL | 0 refills | Status: DC

## 2018-03-24 NOTE — ED Notes (Signed)
Patient verbalizes understanding of discharge instructions. Opportunity for questioning and answers were provided. Armband removed by staff, pt discharged from ED.  

## 2018-03-24 NOTE — Discharge Instructions (Addendum)
Continue to use your inhaler and nebulizer as needed for your coughing. You may take the hydrocodone-acetaminophen at night to help suppress the cough. Return if symptoms are getting worse.

## 2018-03-25 MED FILL — methIMAzole 10 MG TABS: 10 | 30 days supply | Qty: 90 | Fill #0

## 2018-03-25 MED FILL — predniSONE 20 MG TABS: 20 | 4 days supply | Qty: 8 | Fill #0

## 2018-04-02 ENCOUNTER — Encounter (INDEPENDENT_AMBULATORY_CARE_PROVIDER_SITE_OTHER): Payer: Self-pay | Admitting: Physician Assistant

## 2018-04-02 ENCOUNTER — Ambulatory Visit (INDEPENDENT_AMBULATORY_CARE_PROVIDER_SITE_OTHER): Payer: Self-pay | Admitting: Physician Assistant

## 2018-04-02 ENCOUNTER — Other Ambulatory Visit: Payer: Self-pay

## 2018-04-02 ENCOUNTER — Other Ambulatory Visit: Payer: Self-pay | Admitting: Endocrinology

## 2018-04-02 VITALS — BP 125/75 | HR 83 | Temp 97.8°F | Ht 64.0 in | Wt 225.2 lb

## 2018-04-02 DIAGNOSIS — M25551 Pain in right hip: Secondary | ICD-10-CM

## 2018-04-02 DIAGNOSIS — Z91199 Patient's noncompliance with other medical treatment and regimen due to unspecified reason: Secondary | ICD-10-CM

## 2018-04-02 DIAGNOSIS — E1142 Type 2 diabetes mellitus with diabetic polyneuropathy: Secondary | ICD-10-CM

## 2018-04-02 DIAGNOSIS — Z9119 Patient's noncompliance with other medical treatment and regimen: Secondary | ICD-10-CM

## 2018-04-02 DIAGNOSIS — R81 Glycosuria: Secondary | ICD-10-CM

## 2018-04-02 DIAGNOSIS — E0590 Thyrotoxicosis, unspecified without thyrotoxic crisis or storm: Secondary | ICD-10-CM

## 2018-04-02 LAB — POCT URINALYSIS DIPSTICK
Bilirubin, UA: NEGATIVE
Blood, UA: NEGATIVE
Glucose, UA: NEGATIVE
Ketones, UA: NEGATIVE
Leukocytes, UA: NEGATIVE
Nitrite, UA: NEGATIVE
PH UA: 5.5 (ref 5.0–8.0)
Protein, UA: POSITIVE — AB
Spec Grav, UA: 1.025 (ref 1.010–1.025)
Urobilinogen, UA: 0.2 E.U./dL

## 2018-04-02 MED ORDER — CETIRIZINE HCL 10 MG PO TABS: 10.0000 mg | ORAL_TABLET | Freq: Every day | ORAL | 0 refills | Status: AC

## 2018-04-02 MED ORDER — METFORMIN HCL ER 500 MG PO TB24: 500.0000 mg | ORAL_TABLET | Freq: Every day | ORAL | 2 refills | Status: DC

## 2018-04-02 MED FILL — METFORMIN HCL ER 500 MG TAB: 500 | 30 days supply | Qty: 30 | Fill #0

## 2018-04-02 NOTE — Progress Notes (Signed)
Subjective:  Patient ID: Lisa Moreno, female    DOB: 03-29-1976  Age: 42 y.o. MRN: [id number]  CC: f/u thyroid and DM  HPI Lisa Moreno a 42 y.o.femalewith a medical history of asthma, DM2, GERD,HTN, appendectomy, and total abdominal hysterectomy/bilateral oophorepexy 2016 presents to f/u on hyperthyroidism and DM2. Last A1c 6.5% nearly three months ago. She was found to have glycosuria at the last visit but was thought to be attributed to hyperthyroidism. Pt would like to repeat urinalysis.      In regards to hyperthyroidism, pt was able to see endocrinology on 02/27/18. Medical note states she was counseled on hyperthyroid treatment options and she chose to continue on methimazole 20 mg. Pt states there was miscommunication and would proceed with radioablation if possible.     Lastly, pt says she continued with right lower quadrant pain and went to ED at [facility]. 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 No notes or results to review from ED at [facility].        Outpatient Medications Prior to Visit  Medication Sig Dispense Refill  . acetaminophen (TYLENOL) 500 MG tablet Take 500 mg by mouth every 6 (six) hours as needed.    Marland Kitchen atenolol (TENORMIN) 25 MG tablet Take 0.5 tablets (12.5 mg total) by mouth daily. 30 tablet 11  . Blood Glucose Monitoring Suppl (TRUE METRIX METER) DEVI 1 each by Does not apply route daily. 1 Device 0  . docusate sodium (COLACE) 50 MG capsule Take 1 capsule (50 mg total) by mouth 2 (two) times daily. 14 capsule 0  . doxylamine, Sleep, (UNISOM) 25 MG tablet Take 25 mg by mouth at bedtime as needed.    . gabapentin (NEURONTIN) 300 MG capsule Take 1 capsule (300 mg total) by mouth at bedtime. (Patient taking differently: Take 900 mg by mouth at bedtime. ) 30 capsule 5  . glucose blood test strip Use TID 100 each 11  . Lancets MISC 1 each by Does not apply route 3 (three) times daily. 100 each 11  .  metFORMIN (GLUCOPHAGE) 500 MG tablet Take 1 tablet (500 mg total) by mouth 2 (two) times daily with a meal. 30 tablet 11  . methimazole (TAPAZOLE) 10 MG tablet Take 1 1/2 tablets twice daily 90 tablet 5  . polyethylene glycol (MIRALAX / GLYCOLAX) packet Take 17 g by mouth daily.    Marland Kitchen HYDROcodone-acetaminophen (NORCO) 5-325 MG tablet Take 1 tablet by mouth every 4 (four) hours as needed for moderate pain (or coughing). (Patient not taking: Reported on 04/02/2018) 6 tablet 0  . predniSONE (DELTASONE) 20 MG tablet 2 tabs po daily x 4 days 8 tablet 0   No facility-administered medications prior to visit.      ROS Review of Systems  Constitutional: Negative for chills, fever and malaise/fatigue.  Eyes: Negative for blurred vision.  Respiratory: Negative for shortness of breath.   Cardiovascular: Negative for chest pain and palpitations.  Gastrointestinal: Negative for abdominal pain and nausea.  Genitourinary: Negative for dysuria and hematuria.  Musculoskeletal: Positive for joint pain. Negative for myalgias.  Skin: Negative for rash.  Neurological: Negative for tingling and headaches.  Psychiatric/Behavioral: Negative for depression. The patient is not nervous/anxious.     Objective:  BP 125/75 (BP Location: Left Arm, Patient Position: Sitting, Cuff Size: Large)   Pulse 83   Temp 97.8 F (36.6 C) (Oral)   Ht 5\' 4"  (1.626 m)  Wt 225 lb 3.2 oz (102.2 kg)   LMP 08/05/2014   SpO2 91%   BMI 38.66 kg/m   BP/Weight 04/02/2018 03/23/2018 02/27/2018  Systolic BP 125 145 128  Diastolic BP 75 72 76  Wt. (Lbs) 225.2 225.31 225.4  BMI 38.66 38.67 38.69      Physical Exam  Constitutional: She is oriented to person, place, and time.  Well developed, obese, NAD, polite  HENT:  Head: Normocephalic and atraumatic.  Eyes: No scleral icterus.  Neck: Normal range of motion. Neck supple. Thyromegaly present.  Cardiovascular: Normal rate, regular rhythm and normal heart sounds.   Pulmonary/Chest: Effort normal and breath sounds normal.  Musculoskeletal: She exhibits no edema.  Neurological: She is alert and oriented to person, place, and time.  Normal gait  Skin: Skin is warm and dry. No rash noted. No erythema. No pallor.  Psychiatric: She has a normal mood and affect. Her behavior is normal. Thought content normal.  Vitals reviewed.    Assessment & Plan:   1. Glycosuria - Urinalysis Dipstick normal.   2. Hyperthyroidism - [facility] by [facility]. Pt did not like the endocrinologist that attended to her. I counseled patient and said his treatment plan is correct. I further advised her to ask for another endocrinologist at the same practice if she does not like the bedside manners or communication style of the first endocrinologist.   3. Right hip pain - Pt [facility] a [facility] I have asked for her to bring (or have faxed) the imaging report supporting evidence of OA. I plan to refer to orthopedics upon receipt of imaging report. Pt has been advised to inquire about why CFA was disapproved.   4. Type 2 diabetes mellitus with diabetic polyneuropathy, without long-term current use of insulin (HCC) - Begin Metformin 500 mg XR daily. Changed from Metformin 500 mg IR BID. Patient only taking 250 mg to 500 mg IR daily on her own accord due to feeling hypoglycemic. Pt also skipping meals or eating a small snack for a meal. I have advised her to eat three full and diabetic healthy meals per day.  5. Medically noncompliant - Unfortunately patient makes changes to dosing or frequency of her medications on her own accord. Pt advised to call provider before making changes.    Meds ordered this encounter  Medications  . metFORMIN (GLUCOPHAGE XR) 500 MG 24 hr tablet    Sig: Take 1 tablet (500 mg total) by mouth daily with breakfast.    Dispense:  30 tablet    Refill:  2    Order Specific Question:   Supervising Provider     Answer:   Hoy RegisterNEWLIN, ENOBONG [[id number]]  . cetirizine (ZYRTEC) 10 MG tablet    Sig: Take 1 tablet (10 mg total) by mouth daily.    Dispense:  30 tablet    Refill:  0    Order Specific Question:   Supervising Provider    Answer:   Hoy RegisterNEWLIN, ENOBONG [[id number]]    Follow-up: Return in about 3 months (around 07/02/2018) for diabetes.   Loletta Specteroger David Edgardo Petrenko PA

## 2018-04-02 NOTE — Patient Instructions (Signed)
Diabetes mellitus y nutrición  Diabetes Mellitus and Nutrition  Si sufre de diabetes (diabetes mellitus), es muy importante tener hábitos alimenticios saludables debido a que sus niveles de azúcar en la sangre (glucosa) se ven afectados en gran medida por lo que come y bebe. [place]er alimentos saludables en las cantidades adecuadas, aproximadamente a la misma hora todos los días, lo ayudará a:  · Controlar la glucemia.  · Disminuir el riesgo de sufrir una enfermedad cardíaca.  · Mejorar la presión arterial.  · Alcanzar o mantener un peso saludable.    Todas las personas que sufren de diabetes son diferentes y cada una tiene necesidades diferentes en cuanto a un plan de alimentación. El médico puede recomendarle que trabaje con un especialista en dietas y nutrición (nutricionista) para elaborar el mejor plan para usted. Su plan de alimentación puede variar según factores como:  · Las calorías que necesita.  · Los medicamentos que toma.  · Su peso.  · Sus niveles de glucemia, presión arterial y colesterol.  · Su nivel de actividad.  · Otras afecciones que tenga, como enfermedades cardíacas o renales.    ¿Cómo me afectan los carbohidratos?  Los carbohidratos afectan el nivel de glucemia más que cualquier otro tipo de alimento. La ingesta de carbohidratos naturalmente aumenta la cantidad glucosa en la sangre. El recuento de carbohidratos es un método destinado a llevar un registro de la cantidad de carbohidratos que se ingieren. El recuento de carbohidratos es importante para mantener la glucemia a un nivel saludable, en especial si utiliza insulina o toma determinados medicamentos por vía oral para la diabetes.  Es importante saber la cantidad de carbohidratos que se pueden ingerir en cada comida sin correr ningún riesgo. Esto es diferente en cada persona. El nutricionista puede ayudarlo a calcular la cantidad de carbohidratos que debe ingerir en cada comida y colación.   Los alimentos que contienen carbohidratos incluyen:  · Pan, cereal, arroz, pasta y galletas.  · Papas y maíz.  · Guisantes, frijoles y lentejas.  · Leche y yogur.  · Frutas y jugo.  · Postres, como pasteles, galletitas, helado y caramelos.    ¿Cómo me afecta el alcohol?  El alcohol puede provocar disminuciones súbitas de la glucemia (hipoglucemia), en especial si utiliza insulina o toma determinados medicamentos por vía oral para la diabetes. La hipoglucemia es una afección potencialmente mortal. Los síntomas de la hipoglucemia (somnolencia, mareos y confusión) son similares a los síntomas de haber consumido demasiado alcohol.  Si el médico afirma que el alcohol es seguro para usted, siga estas pautas:  · Limite el consumo de alcohol a no más de 1 medida por día si es mujer y no está embarazada, y a 2 medidas si es hombre. Una medida equivale a 12 oz (355 ml) de cerveza, 5 oz (148 ml) de vino o 1½ oz (44 ml) de bebidas de alta graduación alcohólica.  · No beba con el estómago vacío.  · Manténgase hidratado con agua, gaseosas dietéticas o té helado sin azúcar.  · Tenga en cuenta que las gaseosas comunes, los jugos y otros refrescos pueden contener mucha azúcar y se deben contar como carbohidratos.    Consejos para seguir este plan  Leer las etiquetas de los alimentos  · Comience por controlar el tamaño de la porción en la etiqueta. La cantidad de calorías, carbohidratos, grasas y otros nutrientes mencionados en la etiqueta se basan en una porción del alimento. Muchos alimentos contienen más de una porción por envase.  · Verifique la cantidad total de gramos (g)   de carbohidratos totales en una porción. Puede calcular la cantidad de porciones de carbohidratos al dividir el total de carbohidratos por 15. Por ejemplo, si un alimento posee un total de 30 g de carbohidratos, equivale a 2 porciones de carbohidratos.  · Verifique la cantidad de gramos (g) de grasas saturadas y grasas trans  en una porción. Escoja alimentos que no contengan grasa o que tengan un bajo contenido.  · Controle la cantidad de miligramos (mg) de sodio en una porción. La mayoría de las personas deben limitar la ingesta de sodio total a menos de 2300 mg por día.  · Siempre consulte la información nutricional de los alimentos etiquetados como “con bajo contenido de grasa” o “sin grasa”. Estos alimentos pueden ser más altos en azúcar agregada o en carbohidratos refinados y deben evitarse.  · Hable con el nutricionista para identificar sus objetivos diarios en cuanto a los nutrientes mencionados en la etiqueta.  De compras  · Evite comprar alimentos procesados, enlatados o prehechos. Estos alimentos tienden a tener mayor cantidad de grasa, sodio y azúcar agregada.  · Compre en la zona exterior de la tienda de comestibles. Esta incluye frutas y vegetales frescos, granos a granel, carnes frescas y productos lácteos frescos.  Cocción  · Utilice métodos de cocción a baja temperatura, como hornear, en lugar de métodos de cocción a alta temperatura, como freír en abundante aceite.  · Cocine con aceites saludables, como el aceite de oliva, canola o girasol.  · Evite cocinar con manteca, crema o carnes con alto contenido de grasa.  Planificación de las comidas  · Consuma las comidas y las colaciones de forma regular, preferentemente a la misma hora todos los días. Evite pasar largos períodos de tiempo sin comer.  · Consuma alimentos ricos en fibra, como frutas frescas, verduras, frijoles y cereales integrales. Consulte al nutricionista sobre cuántas porciones de carbohidratos puede consumir en cada comida.  · Consuma entre 4 y 6 onzas de proteínas magras por día, como carnes magras, pollo, pescado, huevos o tofu. 1 onza equivale a 1 onza de carne, pollo o pescado, 1 huevo, o 1/4 taza de tofu.  · Coma algunos alimentos por día que contengan grasas saludables, como aguacates, frutos secos, semillas y pescado.  Estilo de vida     · Controle su nivel de glucemia con regularidad.  · Haga ejercicio al menos 30 minutos, 5 días o más por semana, o como se lo haya indicado el médico.  · Tome los medicamentos como se lo haya indicado el médico.  · No consuma ningún producto que contenga nicotina o tabaco, como cigarrillos y cigarrillos electrónicos. Si necesita ayuda para dejar de fumar, consulte al médico.  · Trabaje con un asesor o instructor en diabetes para identificar estrategias para controlar el estrés y cualquier desafío emocional y social.  ¿Cuáles son algunas de las preguntas que puedo hacerle a mi médico?  · ¿Es necesario que me reúna con un instructor en diabetes?  · ¿Es necesario que me reúna con un nutricionista?  · ¿A qué número puedo llamar si tengo preguntas?  · ¿Cuáles son los mejores momentos para controlar la glucemia?  Dónde encontrar más información:  · Asociación Americana de la Diabetes (American Diabetes Association): diabetes.org/food-and-fitness/food  · Academia de Nutrición y Dietética (Academy of Nutrition and Dietetics): www.eatright.org/resources/health/diseases-and-conditions/diabetes  · Instituto Nacional de la Diabetes y las Enfermedades Digestivas y Renales (National Institute of Diabetes and Digestive and Kidney Diseases) (Institutos Nacionales de Salud, NIH): www.niddk.nih.gov/health-information/diabetes/overview/diet-eating-physical-activity  Resumen  · Un plan de alimentación saludable   lo ayudará a controlar la glucemia y mantener un estilo de vida saludable.  · Trabajar con un especialista en dietas y nutrición (nutricionista) puede ayudarlo a elaborar el mejor plan de alimentación para usted.  · Tenga en cuenta que los carbohidratos y el alcohol tienen efectos inmediatos en sus niveles de glucemia. Es importante contar los carbohidratos y consumir alcohol con prudencia.  Esta información no tiene como fin reemplazar el consejo del médico. Asegúrese de hacerle al médico cualquier pregunta que tenga.   Document Released: 07/18/2007 Document Revised: 07/31/2016 Document Reviewed: 07/31/2016  Elsevier Interactive Patient Education © 2018 [employer]

## 2018-04-03 ENCOUNTER — Other Ambulatory Visit (INDEPENDENT_AMBULATORY_CARE_PROVIDER_SITE_OTHER): Payer: Self-pay

## 2018-04-03 DIAGNOSIS — E0590 Thyrotoxicosis, unspecified without thyrotoxic crisis or storm: Secondary | ICD-10-CM

## 2018-04-03 LAB — T3, FREE: T3, Free: 4.8 pg/mL — ABNORMAL HIGH (ref 2.3–4.2)

## 2018-04-03 LAB — T4, FREE: Free T4: 1.48 ng/dL (ref 0.60–1.60)

## 2018-04-03 MED FILL — ATENOLOL 25 MG TABLET: 25 | 30 days supply | Qty: 15 | Fill #3

## 2018-04-10 ENCOUNTER — Ambulatory Visit: Payer: Self-pay | Admitting: Endocrinology

## 2018-04-25 NOTE — Progress Notes (Addendum)
Patient ID: Lisa RamusJohanna Luyando De La Moreno, female   DOB: April 26, 1975, 43 y.o.   MRN: [id number]                                                                                                              Reason for Appointment:  Hyperthyroidism, f/u visit  Referring healthcare provider: Sindy Messingoger Gomez   Chief complaint: Tiredness   History of Present Illness:   Patient was admitted to the hospital in June for fatigue and lightheadedness She was also having symptoms of palpitations, shakiness, feeling excessively warm, irritability, nervousness, and blurring of her vision and dry eyes The patient had lost about 20 Lbs since these symptoms started Was found to be hyperthyroid on evaluation and discharged home on atenolol and methimazole  Recent history: The patient is confused about how she is taking her medication Although on the last visit she presumably was taking 10 mg twice daily of methimazole she is currently taking only 15 mg once a day  On her initial visit in 11/19 her symptoms were improving except for fatigue and sleepiness However her thyroid levels indicated continued hyperthyroidism She was supposed to increase her prescription to 15 mg twice a day Currently is taking only 15 mg once a day  She is still complaining of fatigue and sleepiness Does not have palpitations or shakiness, occasionally will feel hot but overall has less symptoms Her weight has leveled off and she complains of decreased appetite   Her labs in December do show improved free T4 but slightly higher free T3  Wt Readings from Last 3 Encounters:  04/26/18 226 lb 9.6 oz (102.8 kg)  04/02/18 225 lb 3.2 oz (102.2 kg)  03/23/18 225 lb 5 oz (102.2 kg)     Treatments so far: Methimazole 20 mg daily  Thyroid function tests as follows:     Lab Results  Component Value Date   FREET4 1.48 04/03/2018   FREET4 1.76 (H) 02/27/2018   FREET4 2.13 (H) 10/15/2017   T3FREE 4.8 (H) 04/03/2018   T3FREE 5.3 (H)  02/27/2018   TSH <0.01 Repeated and verified X2. (L) 02/27/2018   TSH <0.006 (L) 01/08/2018   TSH <0.010 (L) 10/15/2017    Lab Results  Component Value Date   THYROTRECAB 3.32 (H) 11/08/2017     Allergies as of 04/26/2018      Reactions   Decadron [dexamethasone] Other (See Comments)   Intense burning vaginal/rectal area.       Medication List       Accurate as of April 26, 2018  3:17 PM. Always use your most recent med list.        acetaminophen 500 MG tablet Commonly known as:  TYLENOL Take 500 mg by mouth every 6 (six) hours as needed.   aspirin 81 MG chewable tablet Chew 81 mg by mouth daily. TAKE 1 TABLET BY MOUTH ONCE DAILY.   atenolol 25 MG tablet Commonly known as:  TENORMIN Take 0.5 tablets (12.5 mg total) by mouth daily.   cetirizine 10 MG  tablet Commonly known as:  ZYRTEC Take 1 tablet (10 mg total) by mouth daily.   docusate sodium 50 MG capsule Commonly known as:  COLACE Take 1 capsule (50 mg total) by mouth 2 (two) times daily.   doxylamine (Sleep) 25 MG tablet Commonly known as:  UNISOM Take 25 mg by mouth at bedtime as needed.   gabapentin 300 MG capsule Commonly known as:  NEURONTIN Take 1 capsule (300 mg total) by mouth at bedtime.   glucose blood test strip Use TID   Lancets Misc 1 each by Does not apply route 3 (three) times daily.   metFORMIN 500 MG 24 hr tablet Commonly known as:  GLUCOPHAGE XR Take 1 tablet (500 mg total) by mouth daily with breakfast.   methimazole 10 MG tablet Commonly known as:  TAPAZOLE Take 1 1/2 tablets twice daily   polyethylene glycol packet Commonly known as:  MIRALAX / GLYCOLAX Take 17 g by mouth daily.   TRUE METRIX METER Devi 1 each by Does not apply route daily.           Past Medical History:  Diagnosis Date  . Advanced maternal age in multigravida   . Asthma   . Cough 06/16/2015   Over two months coughing paroxysms; no focal findings on lung exam. Feels ill. CXR ordered today. Empiric  treatment atypicals with macrolide.    . Diabetes mellitus without complication (HCC)   . GERD (gastroesophageal reflux disease)   . Gestational diabetes   . Hypertension   . Hypertension affecting pregnancy, antepartum 11/09/2014   Baseline Protein Creatinine ratio =0.12;   Baseline 24 hour urine = 11 Baseline labs 24 hour urine    . Placenta previa centralis 12/10/2014   Central Previa--vaginal rest. Scheduled CS F/U US with MFM   . Pregnancy with type 2 diabetes mellitus in second trimester 11/09/2014   Class C Needs baseline labs [x]  Cr-wnl [x]  TSH-wnl in 2015 [x]  HgbA1C 5.9% [x]  24 hr urine=16 [x]  Baby ASA [x]  Fetal ECHO-8/30 normal [ ]  optho [ ]  serial u/s for growt [ ]  2x/wk testing at 32 wks     . Rapid heart beat 11/09/2014   Cardiology referral sue to heart palpitations and some chest pain. Obesity, DM, HTN   . 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  Rubella:  Immune  GTT  N/A, Class C DM RPR:   NR  Flu vaccine  HBsAg:   NEG  TDa    Past Surgical History:  Procedure Laterality Date  . ABDOMINAL HYSTERECTOMY    . BREAST REDUCTION SURGERY    . ovarian torsion    . TONSILLECTOMY      Family History  Problem Relation Age of Onset  . Diabetes Mother   . Stroke Mother   . Hypertension Mother   . Depression Mother   . Thyroid disease Mother   . Diabetes Father   . Stroke Father   . Kidney disease Father   . Hypertension Father     Social History:  reports that she has never smoked. She has never used smokeless tobacco. She reports that she  does not drink alcohol or use drugs.  Allergies:  Allergies  Allergen Reactions  . Decadron  [Dexamethasone] Other (See Comments)    Intense burning vaginal/rectal area.      Review of Systems  Constitutional: Positive for reduced appetite.  Respiratory: Positive for daytime sleepiness.   Endocrine: Positive for fatigue.  Psychiatric/Behavioral: Positive for insomnia.   She has had mild diabetes Has been prescribed metformin ER 500 mg but she takes it only once a day from her PCP  Her blood sugars recently and her generic meter range between 81 and 152, mostly above 120 only 1 reading after dinner highest reading was this morning at 9 AM  Today her A1c appears to be increasing again  Lab Results  Component Value Date   HGBA1C 6.8 (A) 04/26/2018   HGBA1C 6.5 (A) 01/08/2018   HGBA1C 6.9 (H) 10/14/2017   Lab Results  Component Value Date   MICROALBUR 1.7 06/16/2015   LDLCALC 72 10/14/2017   CREATININE 0.53 (L) 01/08/2018       Examination:   BP 128/78 (BP Location: Left Arm, Patient Position: Sitting, Cuff Size: Normal)   Pulse 80   Ht 5\' 4"  (1.626 m)   Wt 226 lb 9.6 oz (102.8 kg)   LMP 08/05/2014   SpO2 96%   BMI 38.90 kg/m   Not anxious No swelling of the eyelids or prominence  Thyroid is not clearly palpable, has a fullness on the right side.           She has only minimal tremor Both ankle and biceps reflexes are difficult to elicit  No peripheral edema   Assessment/Plan:   Hyperthyroidism,  from Graves' disease   She has had moderate symptoms since last year Although her thyroid levels appear to have improved last month presumably from her increasing her dose from 20 up to 30 mg she is now taking only 15 mg on her own Objectively she does look euthyroid today Thyroid enlargement is less prominent  Discussed that we will need to check her thyroid levels today to decide on her dosage  For her diabetes she likely needs to be taking her metformin 2 times daily because of her increasing A1c and follow-up with PCP    Reather Littler 04/26/2018, 3:17  PM    Note: This office note was prepared with Dragon voice recognition system technology. Any transcriptional errors that result from this process are unintentional.  Addendum: Thyroid level is higher than normal.  Free T3 is above normal Instead of taking 1-1/2 tablets of 10 mg once a day she will need to take 1 tablet twice a day of the methimazole  Reather Littler

## 2018-04-26 ENCOUNTER — Ambulatory Visit: Payer: Self-pay | Admitting: Endocrinology

## 2018-04-26 ENCOUNTER — Other Ambulatory Visit: Payer: Self-pay

## 2018-04-26 ENCOUNTER — Encounter: Payer: Self-pay | Admitting: Endocrinology

## 2018-04-26 VITALS — BP 128/78 | HR 80 | Ht 64.0 in | Wt 226.6 lb

## 2018-04-26 DIAGNOSIS — E1165 Type 2 diabetes mellitus with hyperglycemia: Secondary | ICD-10-CM

## 2018-04-26 DIAGNOSIS — E0590 Thyrotoxicosis, unspecified without thyrotoxic crisis or storm: Secondary | ICD-10-CM

## 2018-04-26 DIAGNOSIS — Z794 Long term (current) use of insulin: Secondary | ICD-10-CM

## 2018-04-26 LAB — POCT GLYCOSYLATED HEMOGLOBIN (HGB A1C): Hemoglobin A1C: 6.8 % — AB (ref 4.0–5.6)

## 2018-04-26 LAB — T4, FREE: Free T4: 1.32 ng/dL (ref 0.60–1.60)

## 2018-04-26 LAB — TSH: TSH: <0.01 u[IU]/mL — ABNORMAL LOW (ref 0.35–4.50)

## 2018-04-26 LAB — T3, FREE: T3, Free: 4.8 pg/mL — ABNORMAL HIGH (ref 2.3–4.2)

## 2018-04-26 MED ORDER — METHIMAZOLE 10 MG PO TABS: ORAL_TABLET | ORAL | 3 refills | Status: AC

## 2018-05-24 MED FILL — METFORMIN HCL ER 500 MG TAB: 500 | 30 days supply | Qty: 30 | Fill #1

## 2018-05-24 MED FILL — methIMAzole 10 MG TABS: 10 | 30 days supply | Qty: 90 | Fill #1

## 2018-05-24 MED FILL — ATENOLOL 25 MG TABLET: 25 | 30 days supply | Qty: 15 | Fill #4

## 2018-06-03 ENCOUNTER — Other Ambulatory Visit: Payer: Self-pay

## 2018-06-05 ENCOUNTER — Ambulatory Visit: Payer: Self-pay | Admitting: Endocrinology

## 2018-06-05 DIAGNOSIS — Z0289 Encounter for other administrative examinations: Secondary | ICD-10-CM

## 2018-06-13 MED FILL — hydrOXYzine HCL 25 MG TABS: 25 | 10 days supply | Qty: 30 | Fill #0

## 2018-06-13 MED FILL — methIMAzole 10 MG TABS: 10 | 30 days supply | Qty: 120 | Fill #0

## 2018-06-13 MED FILL — ATENOLOL 25 MG TABLET: 25 | 30 days supply | Qty: 30 | Fill #0

## 2018-07-02 ENCOUNTER — Ambulatory Visit (INDEPENDENT_AMBULATORY_CARE_PROVIDER_SITE_OTHER): Payer: Self-pay | Admitting: Primary Care

## 2018-07-02 ENCOUNTER — Encounter (INDEPENDENT_AMBULATORY_CARE_PROVIDER_SITE_OTHER): Payer: Self-pay | Admitting: Primary Care

## 2018-07-02 VITALS — BP 133/75 | HR 61 | Temp 98.7°F | Ht 64.0 in | Wt 231.2 lb

## 2018-07-02 DIAGNOSIS — E1110 Type 2 diabetes mellitus with ketoacidosis without coma: Secondary | ICD-10-CM

## 2018-07-02 DIAGNOSIS — F5101 Primary insomnia: Secondary | ICD-10-CM

## 2018-07-02 DIAGNOSIS — F419 Anxiety disorder, unspecified: Secondary | ICD-10-CM

## 2018-07-02 DIAGNOSIS — E0590 Thyrotoxicosis, unspecified without thyrotoxic crisis or storm: Secondary | ICD-10-CM

## 2018-07-02 DIAGNOSIS — E119 Type 2 diabetes mellitus without complications: Secondary | ICD-10-CM

## 2018-07-02 DIAGNOSIS — E1165 Type 2 diabetes mellitus with hyperglycemia: Secondary | ICD-10-CM

## 2018-07-02 DIAGNOSIS — I10 Essential (primary) hypertension: Secondary | ICD-10-CM

## 2018-07-02 LAB — GLUCOSE, POCT (MANUAL RESULT ENTRY): POC GLUCOSE: 132 mg/dL — AB (ref 70–99)

## 2018-07-02 NOTE — Patient Instructions (Signed)
Trastorno de ansiedad generalizada, en adultos  Generalized Anxiety Disorder, Adult  El trastorno de ansiedad generalizada (TAG) es un trastorno de salud mental. Las personas con esta afeccin se preocupan constantemente por los eventos de todos los das. A diferencia de la ansiedad normal, la preocupacin relacionada con el TAG no se produce por un evento especfico. Estas preocupaciones tampoco desaparecen ni mejoran con el tiempo. EL TAG interfiere con las funciones de la vida, incluidas las relaciones, el trabajo y la escuela.  El TAG puede variar de leve a grave. Las personas con TAG grave tienen intensas oleadas de ansiedad con sntomas fsicos (crisis de angustia).  Cules son las causas?  Se desconoce la causa exacta del TAG.  Qu incrementa el riesgo?  Es ms probable que esta afeccin se manifieste en:   Mujeres.   Las personas que tienen antecedentes familiares de trastornos de ansiedad.   Las personas que son muy tmidas.   Las personas que experimentan eventos muy estresantes en la vida, tales como la muerte de un ser querido.   Las personas que tienen un entorno familiar muy estresante.  Cules son los signos o los sntomas?  Con frecuencia, las personas que padecen el TAG se preocupan excesivamente por muchas cosas en la vida, tales como su salud y su familia. Tambin pueden experimentar una preocupacin desmedida por lo siguiente:   Hacer las cosas bien.   Llegar a tiempo.   Los desastres naturales.   Las amistades.  Los sntomas fsicos del TAG incluyen:   Fatiga.   Tensin muscular o contracciones musculares.   Temblores o agitacin.   Sobresaltarse con facilidad.   Sentir que el corazn late fuerte o est acelerado.   Sentir falta de aire o como que no se puede respirar profundamente.   Problemas para quedarse dormido o para seguir durmiendo.   Sudoracin.   Nuseas, diarrea o sndrome del intestino irritable (SII).   Dolores de cabeza.   Dificultad para concentrarse o  recordar hechos.   Agitacin.   Irritabilidad.  Cmo se diagnostica?  El mdico puede diagnosticar el TAG en funcin de los sntomas y los antecedentes mdicos. Se le realizar un examen fsico. El mdico le har preguntas especficas sobre sus sntomas, que incluyen qu tan graves son, cundo comenzaron y si van y vienen. Tambin puede hacerle preguntas sobre el consumo de alcohol o drogas, incluidos los medicamentos recetados. Su mdico podr derivarlo a un especialista de salud mental para ms evaluaciones.  Su mdico le realizar un examen exhaustivo y puede hacer pruebas adicionales para descartar otras posibles causas de sus sntomas.  Para recibir un diagnstico del TAG, una persona debe tener ansiedad que:   Est fuera de su control.   Afecte distintos aspectos de su vida, como el trabajo y las relaciones.   Cause angustia que le impida participar en sus actividades habituales.   Incluya al menos tres sntomas fsicos del TAG tales como inquietud, fatiga, dificultad para concentrarse, irritabilidad, tensin muscular o problemas para dormir.  Antes de que su mdico pueda confirmar un diagnstico del TAG, estos sntomas deben estar presentes ms das de los que no lo estn y deben tener una duracin de seis meses o ms.  Cmo se trata?  Las siguientes terapias se usan generalmente para tratar el TAG:   Medicamentos. Por lo general, los medicamentos antidepresivos se recetan para un control diario a largo plazo. Se pueden agregar medicamentos para la ansiedad en casos graves, especialmente cuando ocurren crisis de angustia.     Psicoterapia (psicoanlisis). Determinados tipos de psicoterapia pueden ser tiles para tratar el TAG al brindar apoyo, educacin y orientacin. Entre las opciones se incluyen las siguientes:  ? Terapia cognitivo conductual (TCC). Las personas aprenden habilidades para sobrellevar situaciones y tcnicas para aliviar la ansiedad. Aprenden a identificar conductas y pensamientos  irreales o negativos, y a reemplazarlos por positivos.  ? Terapia de aceptacin y compromiso (acceptance and commitment therapy, ACT). Este tratamiento ensea a las personas a ser conscientes como una forma de lidiar con pensamientos y sentimientos no deseados.  ? Biorretroalimentacin. Este proceso lo capacita para controlar la respuesta del cuerpo (respuesta psicolgica) a travs de tcnicas de respiracin y mtodos de relajacin. Usted trabajar con un terapeuta mientras se usan mquinas para controlar sus sntomas fsicos.   Tcnicas para controlar el estrs. Estas incluyen yoga, meditacin y ejercicio.  Un especialista en salud mental puede ayudar a determinar qu tratamiento es el mejor para usted. Algunas personas pueden mejorar con solo un tipo de terapia. Sin embargo, otras personas requieren una combinacin de terapias.  Siga estas instrucciones en su casa:   Tome los medicamentos de venta libre y los recetados solamente como se lo haya indicado el mdico.   Trate de mantener una rutina normal.   Trate de anticipar situaciones estresantes y permita un tiempo adicional para controlarlas.   Participe de cualquier tcnica para autocalmarse o controlar el estrs segn las indicaciones de su mdico.   No se castigue ante los retrocesos o por no realizar progresos.   Trate de reconocer sus logros aunque sean pequeos.   Concurra a todas las visitas de seguimiento como se lo haya indicado el mdico. Esto es importante.  Comunquese con un mdico si:   Los sntomas no mejoran.   Los sntomas empeoran.   Tiene signos de depresin tales como:  ? Tristeza, mal humor o irritabilidad.  ? Ya no disfruta de actividades que le solan causar placer.  ? Cambios en el peso y o en sus hbitos de alimentacin.  ? Cambios en los hbitos de sueo.  ? Evita a amigos y familiares.  ? No tiene energa para realizar las tareas habituales.  ? Tiene sentimientos de culpa o de minusvala.  Solicite ayuda de inmediato  si:   Tiene pensamientos serios acerca de lastimarse a usted mismo o daar a otras personas.  Si alguna vez siente que puede lastimarse o lastimar a los dems, o piensa en poner fin a su vida, busque ayuda de inmediato. Puede dirigirse al servicio de urgencias ms cercano o comunicarse con:   El servicio de emergencias de su localidad (911 en los [place]).   Una lnea de asistencia al suicida y atencin en crisis, como la Lnea Nacional de Prevencin del Suicidio (National Suicide Prevention Lifeline) al 1-800-273-8255. Est disponible las 24 horas del da.  Resumen   El trastorno de ansiedad generalizada (TAG) es un trastorno de salud mental que implica preocupacin no causada por un evento especfico.   Con frecuencia, las personas que padecen el TAG se preocupan excesivamente por muchas cosas en la vida, tales como su salud y su familia.   El TAG puede causar sntomas fsicos tales como inquietud, dificultad para concentrarse, problemas para dormir, sudoracin frecuente, nuseas, diarrea, dolores de cabeza y temblores, o contracciones musculares.   Un especialista en salud mental puede ayudar a determinar qu tratamiento es el mejor para usted. Algunas personas pueden mejorar con solo un tipo de terapia. Sin embargo, otras personas requieren una combinacin   de terapias.  Esta informacin no tiene como fin reemplazar el consejo del mdico. Asegrese de hacerle al mdico cualquier pregunta que tenga.  Document Released: 08/05/2012 Document Revised: 07/14/2016 Document Reviewed: 07/14/2016  [employer] Interactive Patient Education  2019 [employer]

## 2018-07-02 NOTE — Progress Notes (Addendum)
Established Patient Office Visit  Subjective:  Patient ID: Lisa Moreno, female    DOB: 1975/11/25  Age: 43 y.o. MRN: [id number]  CC:  Chief Complaint  Patient presents with  . Follow-up    DM    HPI Lisa Moreno presents for routine visit concerns with dizziness, shortness of breath and  Anxiety/depression. Past medical history of asthma, DM2, GERD,HTN, appendectomy, and total abdominal hysterectomy/bilateral oophorepexy 2016,  hyperthyroidism and DM2.  Last visit to endocrinology he increase methimazole to 20 mcq bid. Requesting a referral to the same endocrinologist to manage T2D.    Past Medical History:  Diagnosis Date  . Advanced maternal age in multigravida   . Asthma   . Cough 06/16/2015   Over two months coughing paroxysms; no focal findings on lung exam. Feels ill. CXR ordered today. Empiric treatment atypicals with macrolide.    . Diabetes mellitus without complication (HCC)   . GERD (gastroesophageal reflux disease)   . Gestational diabetes   . Hypertension   . Hypertension affecting pregnancy, antepartum 11/09/2014   Baseline Protein Creatinine ratio =0.12;   Baseline 24 hour urine = 11 Baseline labs 24 hour urine    . Placenta previa centralis 12/10/2014   Central Previa--vaginal rest. Scheduled CS F/U US with MFM   . Pregnancy with type 2 diabetes mellitus in second trimester 11/09/2014   Class C Needs baseline labs [x]  Cr-wnl [x]  TSH-wnl in 2015 [x]  HgbA1C 5.9% [x]  24 hr urine=16 [x]  Baby ASA [x]  Fetal ECHO-8/30 normal [ ]  optho [ ]  serial u/s for growt [ ]  2x/wk testing at 32 wks     . Rapid heart beat 11/09/2014   Cardiology referral sue to heart palpitations and some chest pain. Obesity, DM, HTN   . 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  Rubella:  Immune  GTT  N/A, Class C DM RPR:   NR  Flu vaccine  HBsAg:   NEG  TDa    Past Surgical History:  Procedure Laterality Date  . ABDOMINAL HYSTERECTOMY    . BREAST REDUCTION SURGERY    . ovarian torsion    . TONSILLECTOMY      Family History  Problem Relation Age of Onset  . Diabetes Mother   . Stroke Mother   . Hypertension Mother   . Depression Mother   . Thyroid disease Mother   . Diabetes Father   . Stroke Father   . Kidney disease Father   . Hypertension Father     Social History   Socioeconomic History  . Marital status: Married    Spouse name: Not on file  . Number of children: Not on file  . Years of education: Not on file  . Highest education level: Not on file  Occupational History  . Not on file  Social Needs  . Financial resource strain: Not on file  . Food insecurity:    Worry: Not on file    Inability: Not on file  . Transportation needs:    Medical: Not on file    Non-medical: Not on file  Tobacco Use  . Smoking status: Never Smoker  . Smokeless tobacco: Never Used  Substance and Sexual Activity  . Alcohol use: No  . Drug use: No  . Sexual activity: Not Currently  Lifestyle  . Physical activity:    Days per week: Not on file    Minutes per session: Not on file  . Stress: Not on file  Relationships  . Social connections:    Talks on phone: Not on file    Gets together: Not on file    Attends religious service: Not on file    Active member of club or organization: Not on file    Attends meetings of clubs or organizations: Not on file    Relationship status: Not on file  . Intimate partner violence:    Fear of current or ex partner: Not on file    Emotionally abused: Not on file    Physically abused: Not on file    Forced sexual activity: Not on file  Other Topics Concern  . Not on file   Social History Narrative  . Not on file    Outpatient Medications Prior to Visit  Medication Sig Dispense Refill  . aspirin 81 MG chewable tablet Chew 81 mg by mouth daily. TAKE 1 TABLET BY MOUTH ONCE DAILY.    Marland Kitchen atenolol (TENORMIN) 25 MG tablet Take 0.5 tablets (12.5 mg total) by mouth daily. 30 tablet 11  . Blood Glucose Monitoring Suppl (TRUE METRIX METER) DEVI 1 each by Does not apply route daily. 1 Device 0  . cetirizine (ZYRTEC) 10 MG tablet Take 1 tablet (10 mg total) by mouth daily. 30 tablet 0  . gabapentin (NEURONTIN) 300 MG capsule Take 1 capsule (300 mg total) by mouth at bedtime. (Patient taking differently: Take 900 mg by mouth at bedtime. ) 30 capsule 5  . glucose blood test strip Use TID 100 each 11  . Lancets MISC 1 each by Does not apply route 3 (three) times daily. 100 each 11  . metFORMIN (GLUCOPHAGE XR) 500 MG 24 hr tablet Take 1 tablet (500 mg total) by mouth daily with breakfast. 30 tablet 2  . methimazole (TAPAZOLE) 10 MG tablet Take 1 tablet twice daily 180 tablet 3  . polyethylene glycol (MIRALAX / GLYCOLAX) packet Take 17 g by mouth daily.    Marland Kitchen acetaminophen (TYLENOL) 500 MG tablet Take 500 mg by mouth every 6 (six) hours as needed.    . doxylamine, Sleep, (UNISOM) 25 MG tablet Take 25 mg by mouth at bedtime as needed.    . docusate sodium (COLACE) 50 MG capsule Take 1 capsule (50 mg total) by mouth 2 (two) times daily. 14 capsule 0   No facility-administered medications prior to visit.     Allergies  Allergen Reactions  . Decadron [Dexamethasone] Other (See Comments)    Intense burning vaginal/rectal area.     ROS Review of Systems  Constitutional: Positive for fatigue.  HENT: Positive for ear pain.   Eyes: Negative.   Respiratory: Positive for chest tightness.   Gastrointestinal: Positive for abdominal distention.  Endocrine: Negative.   Genitourinary: Negative.   Musculoskeletal: Negative.   Skin: Negative.   Allergic/Immunologic: Negative.    Neurological: Positive for dizziness and weakness.      Objective:    Physical Exam  Constitutional: She is oriented to person, place, and time. She appears well-developed and well-nourished.  HENT:  Head: Normocephalic.  Eyes: Pupils are equal, round, and reactive to light. EOM are normal.  Neck: Normal range of motion. Neck supple.  Cardiovascular: Normal rate and regular rhythm.  Pulmonary/Chest: Effort normal and breath sounds normal.  Abdominal: Soft. Bowel sounds are normal.  Musculoskeletal: Normal range of motion.  Neurological: She is alert and oriented to person, place, and time.  Skin: Skin is warm and dry.  Psychiatric: She has a normal mood and affect.   ------ BP 133/75 (BP Location: Left Arm, Patient Position: Sitting, Cuff Size: Large)   Pulse 61   Temp 98.7 F (37.1 C) (Oral)   Ht  (1.626 m)   Wt 231 lb 3.2 oz (104.9 kg)   LMP 08/05/2014   SpO2 96%   BMI 39.69 kg/m  Wt Readings from Last 3 Encounters:  07/02/18 231 lb 3.2 oz (104.9 kg)  04/26/18 226 lb 9.6 oz (102.8 kg)  04/02/18 225 lb 3.2 oz (102.2 kg)     Health Maintenance Due  Topic Date Due  . FOOT EXAM  05/21/1985  . OPHTHALMOLOGY EXAM  05/21/1985  . PAP SMEAR-Modifier  11/08/2017    There are no preventive care reminders to display for this patient.  Lab Results  Component Value Date   TSH <0.01 (L) 04/26/2018   Lab Results  Component Value Date   WBC 13.6 (H) 01/08/2018   HGB 11.6 01/08/2018   HCT 35.4 01/08/2018   MCV 76 (L) 01/08/2018   PLT 291 01/08/2018   Lab Results  Component Value Date   NA 139 01/08/2018   K 3.9 01/08/2018   CO2 24 01/08/2018   GLUCOSE 233 (H) 01/08/2018   BUN 12 01/08/2018   CREATININE 0.53 (L) 01/08/2018   BILITOT 0.7 10/14/2017   ALKPHOS 110 10/14/2017   AST 15 10/14/2017   ALT 16 10/14/2017   PROT 7.3 10/14/2017   ALBUMIN 3.2 (L) 10/14/2017   CALCIUM 9.1 01/08/2018   ANIONGAP 7 10/15/2017   Lab Results  Component Value Date    CHOL 123 10/14/2017   Lab Results  Component Value Date   HDL 34 (L) 10/14/2017   Lab Results  Component Value Date   LDLCALC 72 10/14/2017   Lab Results  Component Value Date   TRIG 87 10/14/2017   Lab Results  Component Value Date   CHOLHDL 3.6 10/14/2017   Lab Results  Component Value Date   HGBA1C 6.8 (A) 04/26/2018      1. Type 2 diabetes mellitus without complication, unspecified whether long term insulin use (HCC) Add low dose of ACE for renal protection. Requesting for endocrinologists to also  - Glucose (CBG)  2. Essential hypertension Low dose Ace added for renal protection Bp acceptable  3. Hyperthyroidism Followed by endocrinologist recently adjusted medication   4. Anxiety disorder, unspecified type She will see CSW today and we will discuss an SSRI low dose    6. Primary insomnia This is actually to to her child that has special needs and does not allow her to sleep or rest. Assessment & Plan:   Problem List Items Addressed This Visit    Type 2 diabetes mellitus without complication (HCC) - Primary   Relevant Orders   Glucose (CBG) (Completed)   Ambulatory referral to Endocrinology   HTN (hypertension)   DM (diabetes mellitus) type 2, uncontrolled, with ketoacidosis (HCC)   Hyperthyroidism   Relevant Orders   Ambulatory referral to Endocrinology    Other Visit Diagnoses    Anxiety disorder, unspecified type       Primary insomnia  No orders of the defined types were placed in this encounter.   Follow-up: Return in about 3 months (around 10/02/2018) for HTN.    Grayce SessionsMichelle P Indy Prestwood, NP

## 2018-07-02 NOTE — Addendum Note (Signed)
Addended by: Grayce Sessions on: 07/02/2018 11:01 AM   Modules accepted: Orders

## 2018-07-03 LAB — THYROID PANEL WITH TSH
Free Thyroxine Index: 1.9 (ref 1.2–4.9)
T3 Uptake Ratio: 30 % (ref 24–39)
T4, Total: 6.3 ug/dL (ref 4.5–12.0)

## 2018-07-09 ENCOUNTER — Telehealth (INDEPENDENT_AMBULATORY_CARE_PROVIDER_SITE_OTHER): Payer: Self-pay | Admitting: Primary Care

## 2018-07-09 MED FILL — GABAPENTIN 300 MG CAPSULE: 300 | 90 days supply | Qty: 90 | Fill #0

## 2018-07-09 MED FILL — hydrOXYzine HCL 25 MG TABS: 25 | 30 days supply | Qty: 90 | Fill #1

## 2018-07-09 MED FILL — ATENOLOL 25 MG TABLET: 25 | 90 days supply | Qty: 90 | Fill #1

## 2018-07-09 MED FILL — metFORMIN HCL 500 MG TABS: 500 | 90 days supply | Qty: 180 | Fill #2

## 2018-07-09 MED FILL — methIMAzole 10 MG TABS: 10 | 90 days supply | Qty: 360 | Fill #1

## 2018-07-09 NOTE — Telephone Encounter (Signed)
Please have patient to contact the provider that prescribed the increase in dosage and ask for refills

## 2018-07-09 NOTE — Telephone Encounter (Signed)
Patient called to request atenolol (TENORMIN) 25 MG tablet  Patient states the endocrinologist from [place] advise her to take 25mg  instead of 12.5 mg. Since 12.5 was prescribe by old PCP Lisa Moreno. Patient is aware that endorcrinologist might have to re-prescribe her med refill since he is the one increasing the dosage of this medication.  Patient uses [facility]  Please advise [phone]  Thank you Lisa Moreno

## 2018-07-12 NOTE — Telephone Encounter (Signed)
Spoke with patient she states endocrinologist sent medication. That is not verified based on her chart. She provided number for endocrinologist. CMA contacted their office but they were closed. Will call again on Monday. Maryjean Morn, CMA

## 2018-07-19 NOTE — Telephone Encounter (Signed)
Left voicemail with Nurse at Endocrinology office asking RN to return call to [facility] at [phone]. Maryjean Morn, CMA

## 2018-08-14 ENCOUNTER — Ambulatory Visit (INDEPENDENT_AMBULATORY_CARE_PROVIDER_SITE_OTHER): Payer: Self-pay | Admitting: Endocrinology

## 2018-08-14 ENCOUNTER — Other Ambulatory Visit: Payer: Self-pay

## 2018-08-14 ENCOUNTER — Encounter: Payer: Self-pay | Admitting: Endocrinology

## 2018-08-14 DIAGNOSIS — E0590 Thyrotoxicosis, unspecified without thyrotoxic crisis or storm: Secondary | ICD-10-CM

## 2018-08-14 NOTE — Progress Notes (Addendum)
Patient ID: Lisa Moreno, female   DOB: 1975-11-03, 43 y.o.   MRN: [id number]                                                                                                              Reason for Appointment:  Hyperthyroidism, follow-up visit   Today's office visit was provided via telemedicine using video technique Explained to the patient and the the limitations of evaluation and management by telemedicine and the availability of in person appointments.  The patient understood the limitations and agreed to proceed. Patient also understood that the telehealth visit is billable. . Location of the patient: Home . Location of the provider: Office Only the patient and myself were participating in the encounter   Chief complaint: Tiredness   History of Present Illness:   Prior history: Patient was admitted to the hospital in June 2019 for fatigue and lightheadedness She was also having symptoms of palpitations, shakiness, feeling excessively warm, irritability, nervousness, and blurring of her vision and dry eyes The patient had lost about 20 Lbs since these symptoms started Was found to be hyperthyroid on evaluation and discharged home on atenolol and methimazole  Recent history:  On her initial visit in 11/19 her symptoms were improving except for fatigue and sleepiness However her thyroid levels indicated continued hyperthyroidism She has had difficulty initially following instructions on the correct dosage of methimazole In January 2020 she was told to take 20 mg daily of the methimazole  She did not come back for follow-up and apparently was seen by [facility] endocrinology She was still mildly hyperthyroid and she was told to double her dose to 20 mg twice daily At that time she thinks she was still having problems with palpitations and fatigue  Her labs a couple of weeks later in 2/20 showed mildly increased free T4 of 1.9 and normal T3  Over the last 3 weeks she has  started feeling more tired, cold, more achy and she thinks she is having some weight gain and sleepiness Her most recent free thyroxine index done about early March showed a normal level of 1.9,, normal range 1.2-4.9.  TSH still low  Wt Readings from Last 3 Encounters:  07/02/18 231 lb 3.2 oz (104.9 kg)  04/26/18 226 lb 9.6 oz (102.8 kg)  04/02/18 225 lb 3.2 oz (102.2 kg)      Thyroid function tests as follows:     Lab Results  Component Value Date   FREET4 1.32 04/26/2018   FREET4 1.48 04/03/2018   FREET4 1.76 (H) 02/27/2018   T3FREE 4.8 (H) 04/26/2018   T3FREE 4.8 (H) 04/03/2018   T3FREE 5.3 (H) 02/27/2018   TSH <0.006 (L) 07/02/2018   TSH <0.01 (L) 04/26/2018   TSH <0.01 Repeated and verified X2. (L) 02/27/2018    Lab Results  Component Value Date   THYROTRECAB 3.32 (H) 11/08/2017     Allergies as of 08/14/2018      Reactions   Decadron [dexamethasone] Other (See Comments)   Intense burning vaginal/rectal  area.       Medication List       Accurate as of August 14, 2018  1:00 PM. Always use your most recent med list.        acetaminophen 500 MG tablet Commonly known as:  TYLENOL Take 500 mg by mouth every 6 (six) hours as needed.   aspirin 81 MG chewable tablet Chew 81 mg by mouth daily. TAKE 1 TABLET BY MOUTH ONCE DAILY.   atenolol 25 MG tablet Commonly known as:  TENORMIN Take 0.5 tablets (12.5 mg total) by mouth daily.   cetirizine 10 MG tablet Commonly known as:  ZYRTEC Take 1 tablet (10 mg total) by mouth daily.   doxylamine (Sleep) 25 MG tablet Commonly known as:  UNISOM Take 25 mg by mouth at bedtime as needed.   gabapentin 300 MG capsule Commonly known as:  Neurontin Take 1 capsule (300 mg total) by mouth at bedtime.   glucose blood test strip Use TID   Lancets Misc 1 each by Does not apply route 3 (three) times daily.   metFORMIN 500 MG 24 hr tablet Commonly known as:  Glucophage XR Take 1 tablet (500 mg total) by mouth daily with  breakfast.   methimazole 10 MG tablet Commonly known as:  TAPAZOLE Take 1 tablet twice daily   polyethylene glycol 17 g packet Commonly known as:  MIRALAX / GLYCOLAX Take 17 g by mouth daily.   True Metrix Meter Devi 1 each by Does not apply route daily.           Past Medical History:  Diagnosis Date  . Advanced maternal age in multigravida   . Asthma   . Cough 06/16/2015   Over two months coughing paroxysms; no focal findings on lung exam. Feels ill. CXR ordered today. Empiric treatment atypicals with macrolide.    . Diabetes mellitus without complication (HCC)   . GERD (gastroesophageal reflux disease)   . Gestational diabetes   . Hypertension   . Hypertension affecting pregnancy, antepartum 11/09/2014   Baseline Protein Creatinine ratio =0.12;   Baseline 24 hour urine = 11 Baseline labs 24 hour urine    . Placenta previa centralis 12/10/2014   Central Previa--vaginal rest. Scheduled CS F/U US with MFM   . Pregnancy with type 2 diabetes mellitus in second trimester 11/09/2014   Class C Needs baseline labs [x]  Cr-wnl [x]  TSH-wnl in 2015 [x]  HgbA1C 5.9% [x]  24 hr urine=16 [x]  Baby ASA [x]  Fetal ECHO-8/30 normal [ ]  optho [ ]  serial u/s for growt [ ]  2x/wk testing at 32 wks     . Rapid heart beat 11/09/2014   Cardiology referral sue to heart palpitations and some chest pain. Obesity, DM, HTN   . 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  Rubella:  Immune  GTT  N/A, Class C DM RPR:   NR  Flu vaccine  HBsAg:   NEG  TDa    Past Surgical History:  Procedure Laterality Date  . ABDOMINAL HYSTERECTOMY    . BREAST REDUCTION SURGERY    . ovarian torsion    .  TONSILLECTOMY  Family History  Problem Relation Age of Onset  . Diabetes Mother   . Stroke Mother   . Hypertension Mother   . Depression Mother   . Thyroid disease Mother   . Diabetes Father   . Stroke Father   . Kidney disease Father   . Hypertension Father     Social History:  reports that she has never smoked. She has never used smokeless tobacco. She reports that she does not drink alcohol or use drugs.  Allergies:  Allergies  Allergen Reactions  . Decadron [Dexamethasone] Other (See Comments)    Intense burning vaginal/rectal area.      Review of Systems  Constitutional: Positive for weight loss.  HENT: Positive for headaches.   Respiratory: Positive for daytime sleepiness.   Cardiovascular: Negative for leg swelling.  Endocrine: Positive for fatigue and cold intolerance.  Musculoskeletal: Positive for joint pain.   She has had mild diabetes Has been prescribed metformin ER 500 mg and is followed by her PCP   Lab Results  Component Value Date   HGBA1C 6.8 (A) 04/26/2018   HGBA1C 6.5 (A) 01/08/2018   HGBA1C 6.9 (H) 10/14/2017   Lab Results  Component Value Date   MICROALBUR 1.7 06/16/2015   LDLCALC 72 10/14/2017   CREATININE 0.53 (L) 01/08/2018   She is having significant tingling in her arms and asking about increasing her gabapentin which was given by PCP, she takes 300 mg in the afternoon and another 600 mg at bedtime    Examination:   LMP 08/05/2014   Her face does not appear swollen, no swelling of the eyes   Assessment/Plan:   Hyperthyroidism,  from Graves' disease   She has had inconsistent control of her hyperthyroidism From another physician that she had seen her methimazole has been increased significantly from 20 mg to 40 mg about 2-3 months ago Subsequently her thyroid levels had come down to normal about a month later  Her last thyrotropin receptor antibody was still high  Since she appears to be hypothyroid she will need to  reduce her dosage to 20 mg a day instead of 40 for now She can skip her dose today She will try to come in for labs soon as possible We will also need follow-up in about a month  May consider radioactive iodine treatment also if treatment is not adequate or she is able to taper her dosage down significantly  Reather LittlerAjay Elizabeth Haff 08/14/2018, 1:00 PM    Note: This office note was prepared with Dragon voice recognition system technology. Any transcriptional errors that result from this process are unintentional.   Reather LittlerAjay Eretria Manternach

## 2018-08-16 ENCOUNTER — Other Ambulatory Visit: Payer: Self-pay

## 2018-08-16 ENCOUNTER — Other Ambulatory Visit (INDEPENDENT_AMBULATORY_CARE_PROVIDER_SITE_OTHER): Payer: Self-pay

## 2018-08-16 DIAGNOSIS — E0590 Thyrotoxicosis, unspecified without thyrotoxic crisis or storm: Secondary | ICD-10-CM

## 2018-08-16 NOTE — Addendum Note (Signed)
Addended by: Adline Mango I on: 08/16/2018 03:58 PM   Modules accepted: Orders

## 2018-08-16 NOTE — Addendum Note (Signed)
Addended by: STONE-ELMORE, Spruha Weight I on: 08/16/2018 03:58 PM   Modules accepted: Orders  

## 2018-08-17 LAB — TSH: TSH: 0.179 u[IU]/mL — ABNORMAL LOW (ref 0.450–4.500)

## 2018-08-17 LAB — T4, FREE: Free T4: 0.76 ng/dL — ABNORMAL LOW (ref 0.82–1.77)

## 2018-08-27 ENCOUNTER — Ambulatory Visit: Payer: Self-pay | Admitting: Primary Care

## 2018-08-27 ENCOUNTER — Other Ambulatory Visit: Payer: Self-pay

## 2018-08-28 ENCOUNTER — Ambulatory Visit: Payer: Self-pay | Attending: Family Medicine | Admitting: Physician Assistant

## 2018-08-28 ENCOUNTER — Other Ambulatory Visit: Payer: Self-pay

## 2018-08-28 DIAGNOSIS — J029 Acute pharyngitis, unspecified: Secondary | ICD-10-CM

## 2018-08-28 DIAGNOSIS — Z789 Other specified health status: Secondary | ICD-10-CM

## 2018-08-28 DIAGNOSIS — E1110 Type 2 diabetes mellitus with ketoacidosis without coma: Secondary | ICD-10-CM

## 2018-08-28 DIAGNOSIS — M2550 Pain in unspecified joint: Secondary | ICD-10-CM

## 2018-08-28 DIAGNOSIS — Z20818 Contact with and (suspected) exposure to other bacterial communicable diseases: Secondary | ICD-10-CM

## 2018-08-28 MED ORDER — DICLOFENAC SODIUM 75 MG PO TBEC: 75.0000 mg | DELAYED_RELEASE_TABLET | Freq: Two times a day (BID) | ORAL | 0 refills | Status: DC

## 2018-08-28 MED ORDER — PENICILLIN V POTASSIUM 500 MG PO TABS: 500.0000 mg | ORAL_TABLET | Freq: Three times a day (TID) | ORAL | 0 refills | Status: DC

## 2018-08-28 MED FILL — DICLOFENAC SOD EC 75 MG TAB: 75 | 15 days supply | Qty: 30 | Fill #0

## 2018-08-28 MED FILL — PENICILLIN VK 500 MG TABLET: 500 | 10 days supply | Qty: 30 | Fill #0

## 2018-08-28 NOTE — Progress Notes (Signed)
Patient is having mucous in the throat for two weeks.

## 2018-08-28 NOTE — Progress Notes (Signed)
Patient ID: Lisa Moreno, female   DOB: 1975/07/05, 43 y.o.   MRN: [id number] Virtual Visit via Telephone Note  I connected with Lisa Moreno on 08/28/18 at  1:50 PM EDT by telephone and verified that I am speaking with the correct person using two identifiers.   I discussed the limitations, risks, security and privacy concerns of performing an evaluation and management service by telephone and the availability of in person appointments. I also discussed with the patient that there may be a patient responsible charge related to this service. The patient expressed understanding and agreed to proceed.  Patient location:  home My Location:  [facility] office Persons on the call: myself, the patient, interpreter  History of Present Illness:   Patient c/o ST.  One of her children tested positive for strep throat.  Also c/o HA.  Symptoms for about 6 days.  Also has joint pain and naprosyn is not helping.  No fever.    Observations/Objective:  TP linear.  Speech is clear.    Assessment and Plan: 1. Pharyngitis, unspecified etiology Definite exposure with children testing positive for last week - penicillin v potassium (VEETID) 500 MG tablet; Take 1 tablet (500 mg total) by mouth 3 (three) times daily.  Dispense: 30 tablet; Refill: 0  2. Language barrier pacific interpreters used and additional time performing visit was required.   3. DM (diabetes mellitus) type 2 Blood sugars bt 130-160  4. Exposure to strep throat - penicillin v potassium (VEETID) 500 MG tablet; Take 1 tablet (500 mg total) by mouth 3 (three) times daily.  Dispense: 30 tablet; Refill: 0  5. Arthralgia, unspecified joint - diclofenac (VOLTAREN) 75 MG EC tablet; Take 1 tablet (75 mg total) by mouth 2 (two) times daily.  Dispense: 30 tablet; Refill: 0   Follow Up Instructions: F/up prn   I discussed the assessment and treatment plan with the patient. The patient was provided an opportunity to ask  questions and all were answered. The patient agreed with the plan and demonstrated an understanding of the instructions.   The patient was advised to call back or seek an in-person evaluation if the symptoms worsen or if the condition fails to improve as anticipated.  I provided 15 minutes of non-face-to-face time during this encounter.   Georgian Co, PA-C

## 2018-08-29 MED FILL — TRUE METRIX TEST STRIP: 30 days supply | Qty: 100 | Fill #1

## 2018-09-02 ENCOUNTER — Telehealth: Payer: Self-pay | Admitting: Primary Care

## 2018-09-02 NOTE — Telephone Encounter (Signed)
Patient called stating that she was prescribed penicillin v potassium (VEETID) 500 MG tablet  And patient states that in the morning she has no effect but when she takes it in the afternoon and night she gets really sleepy and does not remember what she does. Patient states that she has had episodes when family members have called her stating that she called them and patient does not remember calling them. Patient states she will not take medication until she speak with a nurse to see what's going on. Please f/u

## 2018-09-02 NOTE — Telephone Encounter (Signed)
Spanish interpreter [place],  [place] #[phone] from [facility] interpreters-   States that PCN V makes her feel like she received Morphine. She is doing things and people are telling her that she does things that she does not recall also this medication makes her sleepy.   Pt does not sound to be in distress with breathing. Denies SOB, swelling, or rash.  Pt advised to discontinue the medication until further notice.   Please advise.

## 2018-09-03 NOTE — Telephone Encounter (Signed)
Has pt completed abt . Presents as an intolerance as oppose to reaction

## 2018-09-04 NOTE — Telephone Encounter (Signed)
Does she need to take anything else d/t the intolerance?

## 2018-09-04 NOTE — Telephone Encounter (Signed)
Has she completed abt- no

## 2018-09-05 NOTE — Telephone Encounter (Signed)
Patient called returned.  Patient did not answer and a voice message and return number left.

## 2018-09-10 ENCOUNTER — Other Ambulatory Visit: Payer: Self-pay

## 2018-09-10 ENCOUNTER — Ambulatory Visit: Payer: Self-pay | Attending: Primary Care | Admitting: Primary Care

## 2018-09-10 ENCOUNTER — Encounter: Payer: Self-pay | Admitting: Primary Care

## 2018-09-10 DIAGNOSIS — J029 Acute pharyngitis, unspecified: Secondary | ICD-10-CM

## 2018-09-10 DIAGNOSIS — E1110 Type 2 diabetes mellitus with ketoacidosis without coma: Secondary | ICD-10-CM

## 2018-09-10 DIAGNOSIS — E0590 Thyrotoxicosis, unspecified without thyrotoxic crisis or storm: Secondary | ICD-10-CM

## 2018-09-10 DIAGNOSIS — Z760 Encounter for issue of repeat prescription: Secondary | ICD-10-CM

## 2018-09-10 DIAGNOSIS — M2550 Pain in unspecified joint: Secondary | ICD-10-CM

## 2018-09-10 MED ORDER — ATENOLOL 25 MG PO TABS: 12.5000 mg | ORAL_TABLET | Freq: Every day | ORAL | 3 refills | Status: AC

## 2018-09-10 NOTE — Progress Notes (Signed)
Virtual Visit via Telephone Note  I connected with Lisa Moreno on 09/10/18 at  1:50 PM EDT by telephone and verified that I am speaking with the correct person using two identifiers.   I discussed the limitations, risks, security and privacy concerns of performing an evaluation and management service by telephone and the availability of in person appointments. I also discussed with the patient that there may be a patient responsible charge related to this service. The patient expressed understanding and agreed to proceed.   History of Present Illness: Patient is complaining that she still has a sore throat and medication is not working. When patient took medication it was once a day and prescribed 3 times a day. She feels like the medication is too strong and making her sleepy. States she can not be sleepy taking care of her 3 children. Cont's to c/o sore throat will have labs and strep test done tomorrow.   Observations/Objective: Review of Systems  Constitutional: Negative.   HENT: Negative.        Sore throat  Eyes: Negative.   Respiratory: Negative.   Cardiovascular: Negative.   Gastrointestinal: Negative.   Genitourinary: Negative.   Musculoskeletal: Positive for joint pain and myalgias.  Skin: Negative.   Neurological: Negative.   Endo/Heme/Allergies: Negative.   Psychiatric/Behavioral: Negative.     Assessment and Plan: Lisa Moreno was seen today for follow-up.  Diagnoses and all orders for this visit:  Sore throat Continues to c/o pain explained medication not taken correctly but also feels as though skin feeling off . To come into office   Morbid obesity (HCC) Encourage exercise and decrease carbs   Hyperthyroidism  Followed by Dr. Lucianne Muss for endocrinology   Medication refill Has not taken metformin since 2/20 will ck A1C   Arthralgia, unspecified joint Morbid obesity can contribute to diffuse pain and tsh/t4 was done 08/16/2018 by  Endocrinology abnormal  levels Dosage changed. On 08/28/2018 Voltaren prescribed   DM (diabetes mellitus)type 2, controlled  A1C 6.8 and stop metformin in February no refills   Follow Up Instructions:    I discussed the assessment and treatment plan with the patient. The patient was provided an opportunity to ask questions and all were answered. The patient agreed with the plan and demonstrated an understanding of the instructions.   The patient was advised to call back or seek an in-person evaluation if the symptoms worsen or if the condition fails to improve as anticipated.  I provided 45 minutes of non-face-to-face time during this encounter.   Lisa Sessions, NP

## 2018-09-10 NOTE — Progress Notes (Signed)
Patient verified DOB Patient has not taken medication today. Patient has eaten today. Patient complains of pain in in the joints and has not been taking the medication as prescribed. Patient

## 2018-09-11 ENCOUNTER — Ambulatory Visit: Payer: Self-pay | Attending: Primary Care

## 2018-09-11 ENCOUNTER — Other Ambulatory Visit: Payer: Self-pay

## 2018-09-11 DIAGNOSIS — J029 Acute pharyngitis, unspecified: Secondary | ICD-10-CM

## 2018-09-11 DIAGNOSIS — E1110 Type 2 diabetes mellitus with ketoacidosis without coma: Secondary | ICD-10-CM

## 2018-09-11 MED FILL — GABAPENTIN 300 MG CAPSULE: 300 | 30 days supply | Qty: 30 | Fill #1

## 2018-09-12 LAB — COMPREHENSIVE METABOLIC PANEL
ALT: 8 IU/L (ref 0–32)
AST: 10 IU/L (ref 0–40)
Albumin/Globulin Ratio: 1.4 (ref 1.2–2.2)
Albumin: 4.2 g/dL (ref 3.8–4.8)
Alkaline Phosphatase: 107 IU/L (ref 39–117)
BUN/Creatinine Ratio: 18 (ref 9–23)
BUN: 13 mg/dL (ref 6–24)
Bilirubin Total: <0.2 mg/dL (ref 0.0–1.2)
CO2: 23 mmol/L (ref 20–29)
Calcium: 9.1 mg/dL (ref 8.7–10.2)
Chloride: 103 mmol/L (ref 96–106)
Creatinine, Ser: 0.71 mg/dL (ref 0.57–1.00)
GFR calc Af Amer: 121 mL/min/{1.73_m2} (ref >59)
GFR calc non Af Amer: 105 mL/min/{1.73_m2} (ref >59)
Globulin, Total: 2.9 g/dL (ref 1.5–4.5)
Glucose: 134 mg/dL — ABNORMAL HIGH (ref 65–99)
Potassium: 4.2 mmol/L (ref 3.5–5.2)
Sodium: 139 mmol/L (ref 134–144)
Total Protein: 7.1 g/dL (ref 6.0–8.5)

## 2018-09-12 LAB — CBC WITH DIFFERENTIAL/PLATELET
Basophils Absolute: 0.1 10*3/uL (ref 0.0–0.2)
Basos: 1 %
EOS (ABSOLUTE): 0.3 10*3/uL (ref 0.0–0.4)
Eos: 2 %
Hematocrit: 33.7 % — ABNORMAL LOW (ref 34.0–46.6)
Hemoglobin: 11.5 g/dL (ref 11.1–15.9)
Immature Grans (Abs): 0 10*3/uL (ref 0.0–0.1)
Immature Granulocytes: 0 %
Lymphocytes Absolute: 3.9 10*3/uL — ABNORMAL HIGH (ref 0.7–3.1)
Lymphs: 27 %
MCH: 26.4 pg — ABNORMAL LOW (ref 26.6–33.0)
MCHC: 34.1 g/dL (ref 31.5–35.7)
MCV: 77 fL — ABNORMAL LOW (ref 79–97)
Monocytes Absolute: 0.9 10*3/uL (ref 0.1–0.9)
Monocytes: 7 %
Neutrophils Absolute: 9.1 10*3/uL — ABNORMAL HIGH (ref 1.4–7.0)
Neutrophils: 63 %
Platelets: 335 10*3/uL (ref 150–450)
RBC: 4.36 x10E6/uL (ref 3.77–5.28)
RDW: 14.1 % (ref 11.7–15.4)
WBC: 14.3 10*3/uL — ABNORMAL HIGH (ref 3.4–10.8)

## 2018-09-12 LAB — HEMOGLOBIN A1C
Est. average glucose Bld gHb Est-mCnc: 143 mg/dL
Hgb A1c MFr Bld: 6.6 % — ABNORMAL HIGH (ref 4.8–5.6)

## 2018-09-13 LAB — CULTURE, GROUP A STREP: Strep A Culture: NEGATIVE

## 2018-09-15 ENCOUNTER — Other Ambulatory Visit: Payer: Self-pay | Admitting: Endocrinology

## 2018-09-15 DIAGNOSIS — E0590 Thyrotoxicosis, unspecified without thyrotoxic crisis or storm: Secondary | ICD-10-CM

## 2018-09-17 ENCOUNTER — Other Ambulatory Visit: Payer: Self-pay

## 2018-09-17 ENCOUNTER — Other Ambulatory Visit (INDEPENDENT_AMBULATORY_CARE_PROVIDER_SITE_OTHER): Payer: Self-pay

## 2018-09-17 ENCOUNTER — Telehealth: Payer: Self-pay | Admitting: *Deleted

## 2018-09-17 DIAGNOSIS — E0590 Thyrotoxicosis, unspecified without thyrotoxic crisis or storm: Secondary | ICD-10-CM

## 2018-09-18 LAB — T4, FREE: Free T4: 0.62 ng/dL (ref 0.60–1.60)

## 2018-09-18 LAB — TSH: TSH: 1.54 u[IU]/mL (ref 0.35–4.50)

## 2018-09-19 NOTE — Progress Notes (Signed)
Please share results with patient

## 2018-09-20 ENCOUNTER — Ambulatory Visit (INDEPENDENT_AMBULATORY_CARE_PROVIDER_SITE_OTHER): Payer: Self-pay | Admitting: Endocrinology

## 2018-09-20 ENCOUNTER — Encounter: Payer: Self-pay | Admitting: Endocrinology

## 2018-09-20 ENCOUNTER — Other Ambulatory Visit: Payer: Self-pay

## 2018-09-20 VITALS — BP 110/74 | HR 66 | Ht 64.0 in | Wt 245.4 lb

## 2018-09-20 DIAGNOSIS — E0590 Thyrotoxicosis, unspecified without thyrotoxic crisis or storm: Secondary | ICD-10-CM

## 2018-09-20 DIAGNOSIS — E1142 Type 2 diabetes mellitus with diabetic polyneuropathy: Secondary | ICD-10-CM

## 2018-09-20 MED ORDER — GABAPENTIN 300 MG PO CAPS: 600.0000 mg | ORAL_CAPSULE | Freq: Three times a day (TID) | ORAL | 0 refills | Status: DC

## 2018-09-20 MED FILL — GABAPENTIN 300 MG CAPSULE: 300 | 15 days supply | Qty: 90 | Fill #0

## 2018-09-20 NOTE — Telephone Encounter (Signed)
error 

## 2018-09-20 NOTE — Patient Instructions (Signed)
Methimazole 1 in am and in pm

## 2018-09-20 NOTE — Progress Notes (Signed)
Patient ID: Lisa RamusJohanna Luyando De La Paz, female   DOB: 1975-10-27, 43 y.o.   MRN: [id number]                                                                                                              Reason for Appointment:  Hyperthyroidism, follow-up visit   Chief complaint: Follow-up of thyroid   History of Present Illness:   Prior history: Patient was admitted to the hospital in June 2019 for fatigue and lightheadedness She was also having symptoms of palpitations, shakiness, feeling excessively warm, irritability, nervousness, and blurring of her vision and dry eyes The patient had lost about 20 Lbs since these symptoms started Was found to be hyperthyroid on evaluation and discharged home on atenolol and methimazole  Recent history:  On her initial visit in 11/19 her symptoms were improving except for fatigue and sleepiness However her thyroid levels indicated continued hyperthyroidism She has had difficulty initially following instructions on the correct dosage of methimazole In January 2020 she was told to take 20 mg daily of the methimazole  [facility] and she was told to double her dose to 20 mg twice daily when she was mildly hypothyroid However when she was back in follow-up in 4/20 here she was found to be hypothyroid with a low free T4 of 0.76 and she had been having symptoms of feeling tired, sleepy, cold and achy  Since she has been taking 30 mg of methimazole instead of 40 she has been feeling fairly good with no further fatigue or sleepiness However she still has not lost any weight but she thinks the weight gain recently is from eating more She takes 2 tablets in the morning and 1 in the evening of the 10 mg atenolol and is regular with this  Also no symptoms of shakiness or palpitations  Her thyroid levels now show a normal TSH but free T4 is low normal at 0.62  Wt Readings from Last 3 Encounters:  09/20/18 245 lb 6.4 oz (111.3 kg)  07/02/18 231 lb  3.2 oz (104.9 kg)  04/26/18 226 lb 9.6 oz (102.8 kg)      Thyroid function tests as follows:     Lab Results  Component Value Date   FREET4 0.62 09/17/2018   FREET4 0.76 (L) 08/16/2018   FREET4 1.32 04/26/2018   T3FREE 4.8 (H) 04/26/2018   T3FREE 4.8 (H) 04/03/2018   T3FREE 5.3 (H) 02/27/2018   TSH 1.54 09/17/2018   TSH 0.179 (L) 08/16/2018   TSH <0.006 (L) 07/02/2018    Lab Results  Component Value Date   THYROTRECAB 3.32 (H) 11/08/2017     Allergies as of 09/20/2018      Reactions   Decadron [dexamethasone] Other (See Comments)   Intense burning vaginal/rectal area.       Medication List       Accurate as of Sep 20, 2018  4:44 PM. If you have any questions, ask your nurse or doctor.        acetaminophen  500 MG tablet Commonly known as:  TYLENOL Take 500 mg by mouth every 6 (six) hours as needed.   aspirin 81 MG chewable tablet Chew 81 mg by mouth daily. TAKE 1 TABLET BY MOUTH ONCE DAILY.   atenolol 25 MG tablet Commonly known as:  TENORMIN Take 0.5 tablets (12.5 mg total) by mouth daily.   cetirizine 10 MG tablet Commonly known as:  ZYRTEC Take 1 tablet (10 mg total) by mouth daily.   diclofenac 75 MG EC tablet Commonly known as:  VOLTAREN Take 1 tablet (75 mg total) by mouth 2 (two) times daily.   doxylamine (Sleep) 25 MG tablet Commonly known as:  UNISOM Take 25 mg by mouth at bedtime as needed.   gabapentin 300 MG capsule Commonly known as:  Neurontin Take 1 capsule (300 mg total) by mouth at bedtime. What changed:  how much to take   glucose blood test strip Use TID   hydrOXYzine 25 MG tablet Commonly known as:  ATARAX/VISTARIL Take 25 mg by mouth 3 (three) times daily as needed for anxiety.   Lancets Misc 1 each by Does not apply route 3 (three) times daily.   metFORMIN 500 MG 24 hr tablet Commonly known as:  Glucophage XR Take 1 tablet (500 mg total) by mouth daily with breakfast. What changed:    when to take this  additional  instructions   methimazole 10 MG tablet Commonly known as:  TAPAZOLE Take 1 tablet twice daily What changed:  additional instructions   penicillin v potassium 500 MG tablet Commonly known as:  VEETID Take 1 tablet (500 mg total) by mouth 3 (three) times daily.   polyethylene glycol 17 g packet Commonly known as:  MIRALAX / GLYCOLAX Take 17 g by mouth daily.   True Metrix Meter Devi 1 each by Does not apply route daily.           Past Medical History:  Diagnosis Date  . Advanced maternal age in multigravida   . Asthma   . Cough 06/16/2015   Over two months coughing paroxysms; no focal findings on lung exam. Feels ill. CXR ordered today. Empiric treatment atypicals with macrolide.    . Diabetes mellitus without complication (HCC)   . GERD (gastroesophageal reflux disease)   . Gestational diabetes   . Hypertension   . Hypertension affecting pregnancy, antepartum 11/09/2014   Baseline Protein Creatinine ratio =0.12;   Baseline 24 hour urine = 11 Baseline labs 24 hour urine    . Placenta previa centralis 12/10/2014   Central Previa--vaginal rest. Scheduled CS F/U US with MFM   . Pregnancy with type 2 diabetes mellitus in second trimester 11/09/2014   Class C Needs baseline labs [x]  Cr-wnl [x]  TSH-wnl in 2015 [x]  HgbA1C 5.9% [x]  24 hr urine=16 [x]  Baby ASA [x]  Fetal ECHO-8/30 normal [ ]  optho [ ]  serial u/s for growt [ ]  2x/wk testing at 32 wks     . Rapid heart beat 11/09/2014   Cardiology referral sue to heart palpitations and some chest pain. Obesity, DM, HTN   . 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  Rubella:  Immune  GTT  N/A, Class C DM RPR:   NR   Flu vaccine  HBsAg:   NEG  TDa    Past Surgical History:  Procedure Laterality Date  . ABDOMINAL HYSTERECTOMY    . BREAST REDUCTION SURGERY    . ovarian torsion    . TONSILLECTOMY      Family History  Problem Relation Age of Onset  . Diabetes Mother   . Stroke Mother   . Hypertension Mother   . Depression Mother   . Thyroid disease Mother   . Diabetes Father   . Stroke Father   . Kidney disease Father   . Hypertension Father     Social History:  reports that she has never smoked. She has never used smokeless tobacco. She reports that she does not drink alcohol or use drugs.  Allergies:  Allergies  Allergen Reactions  . Decadron [Dexamethasone] Other (See Comments)    Intense burning vaginal/rectal area.      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 and is asking for prescription to be done instead of calling the PCP     Examination:   BP 110/74 (BP Location: Left Arm, Patient Position: Sitting, Cuff Size: Normal)   Pulse 66   Ht  (1.626 m)   Wt 245 lb 6.4 oz (111.3 kg)   LMP 08/05/2014   SpO2 96%   BMI 42.12 kg/m       Assessment/Plan:   Hyperthyroidism,  from Graves' disease   She is still requiring significant amounts of methimazole but now appears to be again needing her dose to be reduced with free T4 low normal with taking 30 mg a day However  she is not as symptomatic as when she was taking 40 mg  Her last thyrotropin receptor antibody was high but not clear if she is getting into remission as yet  Currently she is not having any symptoms of hypothyroidism but with persistently low free T4 level can reduce her dose down to 20 mg a day using 10 mg twice daily  She will need consistent follow-up  Given prescription for gabapentin 600 mg 3 times daily for 15 days and she will need to follow-up with her PCP for further prescription Also recommend that she have a neurology evaluation because of unusual symptoms especially in upper extremities that are unlikely to be from mild diabetes  Patient Instructions  Methimazole 1 in am and in pm    Reather Littler 09/20/2018, 4:44 PM    Note: This office note was prepared with Dragon voice recognition system technology. Any transcriptional errors that result from this process are unintentional.   Reather Littler

## 2018-10-02 ENCOUNTER — Other Ambulatory Visit: Payer: Self-pay

## 2018-10-02 ENCOUNTER — Ambulatory Visit (INDEPENDENT_AMBULATORY_CARE_PROVIDER_SITE_OTHER): Payer: Self-pay | Admitting: Primary Care

## 2018-10-02 ENCOUNTER — Encounter (INDEPENDENT_AMBULATORY_CARE_PROVIDER_SITE_OTHER): Payer: Self-pay | Admitting: Primary Care

## 2018-10-02 DIAGNOSIS — E1142 Type 2 diabetes mellitus with diabetic polyneuropathy: Secondary | ICD-10-CM

## 2018-10-02 DIAGNOSIS — F419 Anxiety disorder, unspecified: Secondary | ICD-10-CM

## 2018-10-02 DIAGNOSIS — I10 Essential (primary) hypertension: Secondary | ICD-10-CM

## 2018-10-02 DIAGNOSIS — E782 Mixed hyperlipidemia: Secondary | ICD-10-CM

## 2018-10-02 DIAGNOSIS — E0590 Thyrotoxicosis, unspecified without thyrotoxic crisis or storm: Secondary | ICD-10-CM

## 2018-10-02 DIAGNOSIS — K5900 Constipation, unspecified: Secondary | ICD-10-CM

## 2018-10-02 MED ORDER — LISINOPRIL 5 MG PO TABS: 5.0000 mg | ORAL_TABLET | Freq: Every day | ORAL | 3 refills | Status: AC

## 2018-10-02 MED ORDER — METFORMIN HCL 500 MG PO TABS: 500.0000 mg | ORAL_TABLET | Freq: Two times a day (BID) | ORAL | 3 refills | Status: AC

## 2018-10-02 MED ORDER — GABAPENTIN 300 MG PO CAPS: 900.0000 mg | ORAL_CAPSULE | Freq: Three times a day (TID) | ORAL | 0 refills | Status: DC

## 2018-10-02 MED ORDER — ATORVASTATIN CALCIUM 10 MG PO TABS: 10.0000 mg | ORAL_TABLET | Freq: Every day | ORAL | 3 refills | Status: AC

## 2018-10-02 MED ORDER — HYDROXYZINE HCL 25 MG PO TABS: 25.0000 mg | ORAL_TABLET | Freq: Three times a day (TID) | ORAL | 1 refills | Status: DC | PRN

## 2018-10-02 MED FILL — metFORMIN HCL 500 MG TABS: 500 | 30 days supply | Qty: 60 | Fill #0

## 2018-10-02 MED FILL — GABAPENTIN 300 MG CAPSULE: 300 | 30 days supply | Qty: 270 | Fill #0

## 2018-10-02 MED FILL — ATORVASTATIN 10 MG TABLET: 10 | 30 days supply | Qty: 30 | Fill #0

## 2018-10-02 MED FILL — LISINOPRIL 5 MG TAB: 5 | 30 days supply | Qty: 30 | Fill #0

## 2018-10-02 MED FILL — hydrOXYzine HCL 25 MG TABS: 25 | 10 days supply | Qty: 30 | Fill #0

## 2018-10-02 NOTE — Progress Notes (Signed)
Virtual Visit via Telephone Note  I connected with Lisa Moreno on 10/02/18 at  9:30 AM EDT by telephone and verified that I am speaking with the correct person using two identifiers.   I discussed the limitations, risks, security and privacy concerns of performing an evaluation and management service by telephone and the availability of in person appointments. I also discussed with the patient that there may be a patient responsible charge related to this service. The patient expressed understanding and agreed to proceed.   History of Present Illness: [name] is following up for increased neuropathy ( right side states feels ants crawling and arm feels like it is asleep)   Left hand between thumb and index finger is painful and at times you drop things. Bilateral LE weakness and pain.   Observations/Objective: Review of Systems  Constitutional: Positive for irritability.  HENT: Negative.   Eyes: Negative.   Respiratory: Negative.   Cardiovascular: Negative.   Gastrointestinal: Negative.   Genitourinary: Negative.   Musculoskeletal:       Pain in hands and legs   Skin: Negative.   Neurological: Positive for tingling, weakness and headaches.  Endo/Heme/Allergies: Negative.   Psychiatric/Behavioral: The patient is nervous/anxious.     Assessment and Plan: Lisa Moreno was seen today for hypertension.  Diagnoses and all orders for this visit:  Hyperthyroidism Manage by Dr. Dierdre Highman   Presents for increased anxiety .Onset was 6 to 12 months ago. The problem has been waxing and waning. Symptoms include irritability and nervous/anxious behavior. Symptoms occur most days. The severity of symptoms is moderate. The symptoms are aggravated by family issues. The quality of sleep is fair.  Associated symptoms include anxiety and headaches.  Her past medical history is significant for anxiety/panic attacks and hyperthyroidism. Past treatments include nothing. Prior  compliance problems include medical issues and insurance issues.   Constipation, unspecified constipation type She is using OTC miralax and has increased fiber in her diet   Essential hypertension Blood pressure is well controlled placed on a low dose of ACE for renal protection.   Associated symptoms include anxiety and headaches. Agents associated with hypertension include thyroid hormones. Risk factors for coronary artery disease include diabetes mellitus and obesity. P resent treatments include ACE inhibitors. There are no compliance problems.  Identifiable causes of hypertension include a thyroid problem.    Diabetic polyneuropathy associated with type 2 diabetes mellitus ([place])  She presents for her follow-up diabetic visit. She has type 2 diabetes mellitus.  No MedicAlert identification noted. Her disease course has been stable. Hypoglycemia symptoms include headaches. Associated symptoms include polyuria and weakness. There are no hypoglycemic complications. Symptoms are improving. Diabetic complications include nephropathy. Risk factors for coronary artery disease include diabetes mellitus, dyslipidemia, hypertension and stress. Current diabetic treatment includes oral agent (monotherapy). She is compliant with treatment all of the time. Her weight is stable. She is following a diabetic diet. Meal planning includes carbohydrate counting. There is no change in her home blood glucose trend.   -     gabapentin (NEURONTIN) 300 MG capsule; Take 3 capsules (900 mg total) by mouth 3 (three) times daily for 30 days. -     Lipid panel Mixed hyperlipidemia Started low dose of statin for reduction in CVD -     Lipid panel  Other orders -     hydrOXYzine (ATARAX/VISTARIL) 25 MG tablet; Take 1 tablet (25 mg total) by mouth 3 (three) times daily as needed for anxiety. -  metFORMIN (GLUCOPHAGE) 500 MG tablet; Take 1 tablet (500 mg total) by mouth 2 (two) times daily with a meal. -     atorvastatin  (LIPITOR) 10 MG tablet; Take 1 tablet (10 mg total) by mouth daily. -     lisinopril (ZESTRIL) 5 MG tablet; Take 1 tablet (5 mg total) by mouth daily for 30 days.    Follow Up Instructions:    I discussed the assessment and treatment plan with the patient. The patient was provided an opportunity to ask questions and all were answered. The patient agreed with the plan and demonstrated an understanding of the instructions.   The patient was advised to call back or seek an in-person evaluation if the symptoms worsen or if the condition fails to improve as anticipated.  I provided 20 minutes of non-face-to-face time during this encounter. Includes reviewing charts and decision making   Grayce SessionsMichelle P Edwards, NP

## 2018-10-02 NOTE — Progress Notes (Signed)
Wants gabapentin increased. 

## 2018-10-29 ENCOUNTER — Telehealth (INDEPENDENT_AMBULATORY_CARE_PROVIDER_SITE_OTHER): Payer: Self-pay | Admitting: Primary Care

## 2018-10-29 NOTE — Telephone Encounter (Signed)
FWD to PCP. Lisa Moreno S Lisa Moreno, CMA  

## 2018-10-29 NOTE — Telephone Encounter (Signed)
Patient called to request medication refill for gabapentin (NEURONTIN) 300 MG capsule  Patient uses [facility]  Please advice [phone]  Thank you Emmit Pomfret

## 2018-10-30 ENCOUNTER — Telehealth (INDEPENDENT_AMBULATORY_CARE_PROVIDER_SITE_OTHER): Payer: Self-pay | Admitting: Primary Care

## 2018-10-30 ENCOUNTER — Other Ambulatory Visit: Payer: Self-pay

## 2018-10-30 ENCOUNTER — Encounter (INDEPENDENT_AMBULATORY_CARE_PROVIDER_SITE_OTHER): Payer: Self-pay | Admitting: Primary Care

## 2018-10-30 ENCOUNTER — Encounter (INDEPENDENT_AMBULATORY_CARE_PROVIDER_SITE_OTHER): Payer: Self-pay

## 2018-10-30 DIAGNOSIS — L299 Pruritus, unspecified: Secondary | ICD-10-CM

## 2018-10-30 DIAGNOSIS — L259 Unspecified contact dermatitis, unspecified cause: Secondary | ICD-10-CM

## 2018-10-30 MED ORDER — HYDROXYZINE HCL 25 MG PO TABS: 25.0000 mg | ORAL_TABLET | Freq: Three times a day (TID) | ORAL | 1 refills | Status: AC | PRN

## 2018-10-30 MED ORDER — METRONIDAZOLE 500 MG PO TABS: 500.0000 mg | ORAL_TABLET | Freq: Two times a day (BID) | ORAL | 0 refills | Status: DC

## 2018-10-30 NOTE — Progress Notes (Signed)
Virtual Visit via Telephone Note  I connected with Karsten Ro on 10/30/18 at 10:10 AM EDT by web ex and verified that I am speaking with the correct person using two identifiers.   I discussed the limitations, risks, security and privacy concerns of performing an evaluation and management service by telephone and the availability of in person appointments. I also discussed with the patient that there may be a patient responsible charge related to this service. The patient expressed understanding and agreed to proceed.   History of Present Illness: Patient is concern with random skin irritations appear on her hands, fingers , foot, and ankle. At this time no visible fluid fill bumps, she does state areas are very itchy.  She denies any changes in diet, foods, detergents, soaps or deodorant..               Observations/Objective: Review of Systems  Constitutional: Negative.   HENT: Negative.   Respiratory: Negative.   Cardiovascular: Negative.   Musculoskeletal: Negative.   Skin: Positive for itching and rash.  All other systems reviewed and are negative.   Assessment and Plan: Diagnoses and all orders for this visit:  Contact dermatitis or eczema  metroNIDAZOLE (FLAGYL) 500 MG tablet; Take 1 tablet (500 mg total) by mouth 2 (two) times daily. Allergic reaction/indications to clotrimazole ointment   Pruritus She already is prescribed hydroxyzine this will also help with skin irritation and scratching may also use Eucerin or Cetaphil     Other orders -     metroNIDAZOLE (FLAGYL) 500 MG tablet; Take 1 tablet (500 mg total) by mouth 2 (two) times daily. -     hydrOXYzine (ATARAX/VISTARIL) 25 MG tablet; Take 1 tablet (25 mg total) by mouth 3 (three) times daily as needed for anxiety.    Follow Up Instructions:    I discussed the assessment and treatment plan with the patient. The patient was provided an opportunity to ask questions and all were answered.  The patient agreed with the plan and demonstrated an understanding of the instructions.   The patient was advised to call back or seek an in-person evaluation if the symptoms worsen or if the condition fails to improve as anticipated.  I provided 12 minutes of non-face-to-face time during this encounter.   Kerin Perna, NP

## 2018-10-31 MED FILL — hydrOXYzine HCL 25 MG TABS: 25 | 10 days supply | Qty: 30 | Fill #0

## 2018-10-31 MED FILL — metroNIDAZOLE 500 MG TABS: 500 | 7 days supply | Qty: 14 | Fill #0

## 2018-11-05 MED FILL — ATENOLOL 25 MG TABLET: 25 | 30 days supply | Qty: 30 | Fill #2

## 2018-11-05 MED FILL — LISINOPRIL 5 MG TAB: 5 | 30 days supply | Qty: 30 | Fill #1

## 2018-11-05 MED FILL — ATORVASTATIN 10 MG TABLET: 10 | 30 days supply | Qty: 30 | Fill #1

## 2018-11-06 ENCOUNTER — Other Ambulatory Visit: Payer: Self-pay

## 2018-11-06 ENCOUNTER — Other Ambulatory Visit (INDEPENDENT_AMBULATORY_CARE_PROVIDER_SITE_OTHER): Payer: Self-pay | Admitting: Primary Care

## 2018-11-06 DIAGNOSIS — E1142 Type 2 diabetes mellitus with diabetic polyneuropathy: Secondary | ICD-10-CM

## 2018-11-06 DIAGNOSIS — Z20822 Contact with and (suspected) exposure to covid-19: Secondary | ICD-10-CM

## 2018-11-07 MED FILL — GABAPENTIN 300 MG CAPSULE: 300 | 30 days supply | Qty: 270 | Fill #0

## 2018-11-10 LAB — NOVEL CORONAVIRUS, NAA: SARS-CoV-2, NAA: DETECTED — AB

## 2018-11-14 MED FILL — hydrOXYzine HCL 25 MG TABS: 25 | 10 days supply | Qty: 30 | Fill #1

## 2018-11-23 ENCOUNTER — Other Ambulatory Visit: Payer: Self-pay | Admitting: Primary Care

## 2018-11-23 DIAGNOSIS — Z20822 Contact with and (suspected) exposure to covid-19: Secondary | ICD-10-CM

## 2018-11-25 LAB — NOVEL CORONAVIRUS, NAA: SARS-CoV-2, NAA: NOT DETECTED

## 2018-11-26 ENCOUNTER — Other Ambulatory Visit: Payer: Self-pay

## 2018-11-26 ENCOUNTER — Ambulatory Visit (INDEPENDENT_AMBULATORY_CARE_PROVIDER_SITE_OTHER): Payer: HRSA Program | Admitting: Primary Care

## 2018-11-26 ENCOUNTER — Encounter (INDEPENDENT_AMBULATORY_CARE_PROVIDER_SITE_OTHER): Payer: Self-pay | Admitting: Primary Care

## 2018-11-26 VITALS — Ht 64.0 in

## 2018-11-26 DIAGNOSIS — R0602 Shortness of breath: Secondary | ICD-10-CM

## 2018-11-26 DIAGNOSIS — U071 COVID-19: Secondary | ICD-10-CM

## 2018-11-26 DIAGNOSIS — R51 Headache: Secondary | ICD-10-CM

## 2018-11-26 DIAGNOSIS — R5383 Other fatigue: Secondary | ICD-10-CM

## 2018-11-26 DIAGNOSIS — F5101 Primary insomnia: Secondary | ICD-10-CM

## 2018-11-26 DIAGNOSIS — F419 Anxiety disorder, unspecified: Secondary | ICD-10-CM | POA: Diagnosis not present

## 2018-11-26 DIAGNOSIS — R05 Cough: Secondary | ICD-10-CM

## 2018-11-26 MED ORDER — BUSPIRONE HCL 5 MG PO TABS: 5.0000 mg | ORAL_TABLET | Freq: Two times a day (BID) | ORAL | 3 refills | Status: AC

## 2018-11-26 MED ORDER — SENNOSIDES-DOCUSATE SODIUM 8.6-50 MG PO TABS: 1.0000 | ORAL_TABLET | Freq: Every day | ORAL | 3 refills | Status: AC

## 2018-11-26 NOTE — Progress Notes (Signed)
Virtual Visit via Telephone Note  I connected with Lisa Moreno on 11/26/18 at  3:10 PM EDT by telephone and verified that I am speaking with the correct person using two identifiers.   I discussed the limitations, risks, security and privacy concerns of performing an evaluation and management service by telephone and the availability of in person appointments. I also discussed with the patient that there may be a patient responsible charge related to this service. The patient expressed understanding and agreed to proceed.   History of Present Illness: Lisa Moreno is having a tele visit follow-up on COVID-19 positive virus.  She remains symptomatic with complaints of fatigue shortness of breath cough headache.  She does deny fever, chills, and sore throat.   Observations/Objective: Review of Systems  Constitutional: Positive for diaphoresis and malaise/fatigue.  Respiratory: Positive for cough and shortness of breath.   Gastrointestinal: Positive for constipation.  Musculoskeletal: Positive for myalgias.  Neurological: Positive for headaches.  Psychiatric/Behavioral: Positive for depression. The patient is nervous/anxious and has insomnia.    Assessment and Plan: Lisa Moreno was seen today for follow-up and pain.  Diagnoses and all orders for this visit:  COVID-19 virus infection Your test for COVID-19 was positive, meaning that you were infected with the novel coronavirus and could give the germ to others.  Please continue isolation at home, for at least 10 days since the start of your fever/cough/breathlessness and until you have had 3 consecutive days without fever (without taking a fever reducer) and with cough/breathlessness improving. Please continue good preventive care measures, including:  frequent hand-washing, avoid touching your face, cover coughs/sneezes, stay out of crowds and keep a 6 foot distance from others.  Recheck or go to the nearest hospital  ED tent for re-assessment if fever/cough/breathlessness return.  Anxiety disorder, unspecified type  We discussed options for treatment of anxiety including therapy and/or medication.  Will check basic labs to ensure thyroid is in normal range and that no other metabolic issues are obvious.  Reviewed concept of anxiety as biochemical imbalance of neurotransmitters and rationale for treatment. Discussed potential risks, expected benefits, possible side effects of the medicine. We also discussed how to take it correctly and dosing instructions. If she has any significant side effects to the medicine, she is to stop it and call for advice.  Instructed patient to contact office or on-call physician promptly should condition worsen or any new symptoms appear.    She was agreeable with this plan.   Spent 25 minutes (>50% of visit) discussing the risks of anxiety disorder, the pathophysiology, etiology, risks, and principles of treatment.   Primary insomnia  Insomnia    Difficulty going to sleep: After falling to sleep patient admits to frequently having to get up to check on her children Risk factors/sleep hygiene:    Stimulants:    Alcohol intake:    Reading, watching TV, eating @ bedtime:    Daytime naps:    Stress/anxiety:    Work/travel factors: Prescribe BuSpar 5 mg take the times a day for anxiety will follow-up in 4 weeks for effectiveness.  Other orders -     busPIRone (BUSPAR) 5 MG tablet; Take 1 tablet (5 mg total) by mouth 2 (two) times daily. -     senna-docusate (SENOKOT S) 8.6-50 MG tablet; Take 1 tablet by mouth daily.    Follow Up Instructions:    I discussed the assessment and treatment plan with the patient. The patient was provided an  opportunity to ask questions and all were answered. The patient agreed with the plan and demonstrated an understanding of the instructions.   The patient was advised to call back or seek an in-person evaluation if the symptoms worsen  or if the condition fails to improve as anticipated.  I provided 18 minutes of non-face-to-face time during this encounter.   Kerin Perna, NP

## 2018-11-26 NOTE — Progress Notes (Signed)
Pt states she can not breathe correctly. Feels fatigued and exhausted when breathing  Not sleeping- insomnia  Anxiety attacks- feels desperate Can not concentrate

## 2018-11-27 MED FILL — busPIRone HCL 5 MG TABS: 5 | 30 days supply | Qty: 60 | Fill #0

## 2018-12-05 ENCOUNTER — Telehealth (INDEPENDENT_AMBULATORY_CARE_PROVIDER_SITE_OTHER): Payer: Self-pay

## 2018-12-05 NOTE — Telephone Encounter (Signed)
Patient called to inquire on prescription for metronidazole. It was never sent to the pharmacy. Please send to [facility]. Nat Christen, CMA

## 2018-12-26 ENCOUNTER — Ambulatory Visit (INDEPENDENT_AMBULATORY_CARE_PROVIDER_SITE_OTHER): Payer: Self-pay | Admitting: Primary Care

## 2018-12-26 ENCOUNTER — Other Ambulatory Visit: Payer: Self-pay

## 2018-12-26 DIAGNOSIS — F4323 Adjustment disorder with mixed anxiety and depressed mood: Secondary | ICD-10-CM

## 2018-12-26 DIAGNOSIS — F5101 Primary insomnia: Secondary | ICD-10-CM

## 2018-12-26 DIAGNOSIS — I10 Essential (primary) hypertension: Secondary | ICD-10-CM

## 2018-12-26 NOTE — Progress Notes (Signed)
Virtual Visit via Telephone Note  I connected with [name] on 12/26/18 at  3:10 PM EDT by telephone and verified that I am speaking with the correct person using two identifiers.   I discussed the limitations, risks, security and privacy concerns of performing an evaluation and management service by telephone and the availability of in person appointments. I also discussed with the patient that there may be a patient responsible charge related to this service. The patient expressed understanding and agreed to proceed.   History of Present Illness: [name] is having a tele vi for medication not working she remains unable to sleep on melatonin it helps her to fall asleep but easily awaken and unable to go back to sleep. Remains anxious and has a headache does not feel Buspar helps. Headache is secondary drom not being able to get enough sleep. Past Medical History:  Diagnosis Date  . Advanced maternal age in multigravida   . Asthma   . Cough 06/16/2015   Over two months coughing paroxysms; no focal findings on lung exam. Feels ill. CXR ordered today. Empiric treatment atypicals with macrolide.    . Diabetes mellitus without complication (HCC)   . GERD (gastroesophageal reflux disease)   . Gestational diabetes   . Hypertension   . Hypertension affecting pregnancy, antepartum 11/09/2014   Baseline Protein Creatinine ratio =0.12;   Baseline 24 hour urine = 11 Baseline labs 24 hour urine    . Placenta previa centralis 12/10/2014   Central Previa--vaginal rest. Scheduled CS F/U US with MFM   . Pregnancy with type 2 diabetes mellitus in second trimester 11/09/2014   Class C Needs baseline labs [x]  Cr-wnl [x]  TSH-wnl in 2015 [x]  HgbA1C 5.9% [x]  24 hr urine=16 [x]  Baby ASA [x]  Fetal ECHO-8/30 normal [ ]  optho [ ]  serial u/s for growt [ ]  2x/wk testing at 32 wks     . Rapid heart beat 11/09/2014   Cardiology referral sue to heart palpitations and some chest pain. Obesity, DM, HTN   .  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  Rubella:  Immune  GTT  N/A, Class C DM RPR:   NR  Flu vaccine  HBsAg:   NEG  TDa   Observations/Objective: Review of Systems  Constitutional: Positive for malaise/fatigue.  Neurological: Positive for headaches.  Psychiatric/Behavioral: Positive for depression. The patient has insomnia.   All other systems reviewed and are negative.  Assessment and Plan: Loistine SimasJohanna was seen today for medication management.  Diagnoses and all orders for this visit:  Essential hypertension  Primary insomnia  Insomnia hesitant to prescribing medication for sleep. Patient has a handicap child that requires a lot of her attention and  Does not allow her to sleep soundly. Discussed modifiable sleep patterns- such as no t.v., music, quiet environment as possible.   Adjustment disorder with mixed anxiety and depressed mood Depression is when you feel down, blue or sad for at least 2 weeks in a row. You may experience increaset increase in sleeping, eating or  loss of interest in doing things that once gave you pleasure. Feeling  worthless, guilty, nervous and low self esteem, avoiding interaction with other people or increase agitation.   Follow Up Instructions:    I discussed the assessment and treatment plan with the patient. The patient was provided an opportunity to ask questions and all were answered. The patient agreed with the plan and demonstrated an understanding of the instructions.   The patient was advised to call back or seek an in-person evaluation if the symptoms worsen or if the condition fails to improve as anticipated.  I  provided 22 minutes of non-face-to-face time during this encounter.   Kerin Perna, NP

## 2018-12-27 MED FILL — ATORVASTATIN 10 MG TABLET: 10 | 30 days supply | Qty: 30 | Fill #2

## 2018-12-27 MED FILL — methIMAzole 10 MG TABS: 10 | 30 days supply | Qty: 120 | Fill #0

## 2018-12-27 MED FILL — LISINOPRIL 5 MG TABLET: 5 | 30 days supply | Qty: 30 | Fill #2

## 2018-12-27 MED FILL — ATENOLOL 25 MG TABLET: 25 | 30 days supply | Qty: 30 | Fill #3

## 2019-01-02 MED FILL — GABAPENTIN 300 MG CAPSULE: 300 | 30 days supply | Qty: 270 | Fill #0

## 2019-01-03 ENCOUNTER — Other Ambulatory Visit (INDEPENDENT_AMBULATORY_CARE_PROVIDER_SITE_OTHER): Payer: Self-pay | Admitting: Primary Care

## 2019-01-06 MED FILL — metroNIDAZOLE 500 MG TABS: 500 | 7 days supply | Qty: 14 | Fill #0

## 2019-02-17 ENCOUNTER — Ambulatory Visit (INDEPENDENT_AMBULATORY_CARE_PROVIDER_SITE_OTHER): Payer: Self-pay | Admitting: Primary Care

## 2019-02-24 ENCOUNTER — Telehealth (INDEPENDENT_AMBULATORY_CARE_PROVIDER_SITE_OTHER): Payer: Self-pay | Admitting: Primary Care

## 2019-02-24 DIAGNOSIS — B353 Tinea pedis: Secondary | ICD-10-CM

## 2019-02-24 DIAGNOSIS — E0590 Thyrotoxicosis, unspecified without thyrotoxic crisis or storm: Secondary | ICD-10-CM

## 2019-02-24 MED ORDER — CLOTRIMAZOLE-BETAMETHASONE 1-0.05 % EX CREA: 1.0000 "application " | TOPICAL_CREAM | Freq: Two times a day (BID) | CUTANEOUS | 1 refills | Status: AC

## 2019-02-24 MED FILL — CLOTRIMAZOLE-BETAMETHASONE: 1-0.05 | 10 days supply | Qty: 45 | Fill #0

## 2019-02-24 NOTE — Progress Notes (Signed)
Virtual Visit via Telephone Note  I connected with Lisa Moreno on 02/24/19 at 10:50 AM EST by telephone and verified that I am speaking with the correct person using two identifiers.   I discussed the limitations, risks, security and privacy concerns of performing an evaluation and management service by telephone and the availability of in person appointments. I also discussed with the patient that there may be a patient responsible charge related to this service. The patient expressed understanding and agreed to proceed.   History of Present Illness: Lisa Moreno is having a web visit she is concern that metformin is the cause of her hair failing out. Patient has hyperthyroidism and followed by a endocrinologist in [facility]. The symptoms she is describing moodiness, hair loss maybe due to her thyroid. Patient also voiced concerns about fungal infection on her feet.  Past Medical History:  Diagnosis Date  . Advanced maternal age in multigravida   . Asthma   . Cough 06/16/2015   Over two months coughing paroxysms; no focal findings on lung exam. Feels ill. CXR ordered today. Empiric treatment atypicals with macrolide.    . Diabetes mellitus without complication (HCC)   . GERD (gastroesophageal reflux disease)   . Gestational diabetes   . Hypertension   . Hypertension affecting pregnancy, antepartum 11/09/2014   Baseline Protein Creatinine ratio =0.12;   Baseline 24 hour urine = 11 Baseline labs 24 hour urine    . Placenta previa centralis 12/10/2014   Central Previa--vaginal rest. Scheduled CS F/U US with MFM   . Pregnancy with type 2 diabetes mellitus in second trimester 11/09/2014   Class C Needs baseline labs [x]  Cr-wnl [x]  TSH-wnl in 2015 [x]  HgbA1C 5.9% [x]  24 hr urine=16 [x]  Baby ASA [x]  Fetal ECHO-8/30 normal [ ]  optho [ ]  serial u/s for growt [ ]  2x/wk testing at 32 wks     . Rapid heart beat 11/09/2014   Cardiology referral sue to heart palpitations and  some chest pain. Obesity, DM, HTN   . 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  Rubella:  Immune  GTT  N/A, Class C DM RPR:   NR  Flu vaccine  HBsAg:   NEG  TDa   Observations/Objective: Review of Systems  Constitutional: Positive for malaise/fatigue.       Weight gain  HENT:       Hair loss  Psychiatric/Behavioral:       Irritable  All other systems reviewed and are negative.  Assessment and Plan:  Diagnoses and all orders for this visit:  Hyperthyroidism Referred /deferred to endocrinologist   Tinea pedis, unspecified laterality Discussed yeast thrive in dark moist areas make sure toes are dried in between after bathing . Treat with anti fungal Other orders -     clotrimazole-betamethasone (LOTRISONE) cream; Apply 1 application topically 2 (two) times daily.    Follow Up Instructions:    I discussed the assessment and treatment plan with the patient. The patient was provided an opportunity to ask questions and all were answered. The patient agreed with the plan and demonstrated an  understanding of the instructions.   The patient was advised to call back or seek an in-person evaluation if the symptoms worsen or if the condition fails to improve as anticipated.  I provided 18 minutes of non-face-to-face time during this encounter.   Kerin Perna, NP

## 2019-02-27 MED FILL — LISINOPRIL 5 MG TABLET: 5 | 30 days supply | Qty: 30 | Fill #3

## 2019-02-27 MED FILL — methIMAzole 10 MG TABS: 10 | 30 days supply | Qty: 120 | Fill #0

## 2019-03-26 ENCOUNTER — Ambulatory Visit: Payer: Self-pay | Admitting: Cardiology

## 2019-05-02 MED FILL — methIMAzole 10 MG TABS: 10 | 30 days supply | Qty: 120 | Fill #0

## 2019-07-03 MED FILL — hydrOXYzine HCL 25 MG TABS: 25 | 10 days supply | Qty: 30 | Fill #1

## 2019-07-03 MED FILL — methIMAzole 10 MG TABS: 10 | 30 days supply | Qty: 120 | Fill #1

## 2019-08-29 MED FILL — methIMAzole 10 MG TABS: 10 | 30 days supply | Qty: 120 | Fill #2

## 2019-08-29 MED FILL — busPIRone HCL 5 MG TABS: 5 | 30 days supply | Qty: 60 | Fill #1

## 2019-08-29 MED FILL — CLOTRIMAZOLE-BETAMETHASONE: 1-0.05 | 10 days supply | Qty: 45 | Fill #1

## 2019-09-04 ENCOUNTER — Other Ambulatory Visit: Payer: Self-pay | Admitting: Internal Medicine

## 2019-09-10 ENCOUNTER — Other Ambulatory Visit (INDEPENDENT_AMBULATORY_CARE_PROVIDER_SITE_OTHER): Payer: Self-pay | Admitting: Primary Care

## 2019-09-10 DIAGNOSIS — E1142 Type 2 diabetes mellitus with diabetic polyneuropathy: Secondary | ICD-10-CM

## 2019-09-10 NOTE — Telephone Encounter (Signed)
Sent to PCP ?

## 2019-09-11 MED FILL — CLOTRIMAZOLE-BETAMETHASONE: 1-0.05 | 10 days supply | Qty: 45 | Fill #1

## 2019-09-17 MED FILL — GABAPENTIN 300 MG CAPSULE: 300 | 30 days supply | Qty: 270 | Fill #0

## 2019-09-24 MED FILL — CLOTRIMAZOLE-BETAMETHASONE: 1-0.05 | 10 days supply | Qty: 45 | Fill #1

## 2019-10-01 ENCOUNTER — Ambulatory Visit (INDEPENDENT_AMBULATORY_CARE_PROVIDER_SITE_OTHER): Payer: Self-pay | Admitting: Primary Care

## 2019-10-22 MED FILL — methIMAzole 10 MG TABS: 10 | 30 days supply | Qty: 120 | Fill #2

## 2019-10-30 MED FILL — BASAGLAR 100 UNIT/ML KWIKPE: 100 | 30 days supply | Qty: 3 | Fill #0

## 2019-11-05 MED FILL — !LANTUS SOLOSTAR 100UNITS/M: 100 | 33 days supply | Qty: 15 | Fill #0

## 2019-11-20 ENCOUNTER — Ambulatory Visit (HOSPITAL_COMMUNITY)
Admission: EM | Admit: 2019-11-20 | Discharge: 2019-11-20 | Disposition: A | Discharge status: Home / Self Care | Payer: Self-pay

## 2019-11-20 ENCOUNTER — Other Ambulatory Visit: Payer: Self-pay

## 2019-11-20 ENCOUNTER — Encounter (HOSPITAL_COMMUNITY): Payer: Self-pay

## 2019-11-20 DIAGNOSIS — J029 Acute pharyngitis, unspecified: Secondary | ICD-10-CM | POA: Insufficient documentation

## 2019-11-20 LAB — POCT RAPID STREP A: Streptococcus, Group A Screen (Direct): NEGATIVE

## 2019-11-20 NOTE — Discharge Instructions (Signed)
Sore Throat  Your rapid strep tested Negative today. Most likely viral or related to post nasal drainage  Please continue Tylenol or Ibuprofen for fever and pain. May try salt water gargles, cepacol lozenges, throat spray, or OTC cold relief medicine for throat discomfort. If you also have congestion take a daily anti-histamine like Zyrtec, Claritin, and a oral decongestant to help with post nasal drip that may be irritating your throat.   Stay hydrated and drink plenty of fluids to keep your throat coated relieve irritation.

## 2019-11-20 NOTE — ED Triage Notes (Signed)
Pt presents with sore throat x 3 days. Denies fever, chills. States husband and son tested positive for Strep A.

## 2019-11-21 NOTE — ED Provider Notes (Signed)
[facility]    CSN: [id number] Arrival date & time: 11/20/19  1724      History   Chief Complaint Chief Complaint  Patient presents with  . Sore Throat    HPI Lisa Moreno is a 44 y.o. female presenting today for evaluation of sore throat.  Patient reports that she has had a sore throat for the past 3 days.  She denies associated rhinorrhea or cough.  She does have a slight burning sensation in her lower throat.  Also reports some left-sided abdominal pain.  Feels the need to have diarrhea, but denies diarrhea.  Denies nausea or vomiting.  Reports husband and son have both tested positive for strep.  HPI  Past Medical History:  Diagnosis Date  . Advanced maternal age in multigravida   . Asthma   . Cough 06/16/2015   Over two months coughing paroxysms; no focal findings on lung exam. Feels ill. CXR ordered today. Empiric treatment atypicals with macrolide.    . Diabetes mellitus without complication (HCC)   . GERD (gastroesophageal reflux disease)   . Gestational diabetes   . Hypertension   . Hypertension affecting pregnancy, antepartum 11/09/2014   Baseline Protein Creatinine ratio =0.12;   Baseline 24 hour urine = 11 Baseline labs 24 hour urine    . Placenta previa centralis 12/10/2014   Central Previa--vaginal rest. Scheduled CS F/U US with MFM   . Pregnancy with type 2 diabetes mellitus in second trimester 11/09/2014   Class C Needs baseline labs [x]  Cr-wnl [x]  TSH-wnl in 2015 [x]  HgbA1C 5.9% [x]  24 hr urine=16 [x]  Baby ASA [x]  Fetal ECHO-8/30 normal [ ]  optho [ ]  serial u/s for growt [ ]  2x/wk testing at 32 wks     . Rapid heart beat 11/09/2014   Cardiology referral sue to heart palpitations and some chest pain. Obesity, DM, HTN   . 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  Rubella:  Immune  GTT  N/A, Class C DM RPR:   NR  Flu vaccine  HBsAg:   NEG  TDa    Patient Active Problem List   Diagnosis Date Noted  . Diabetic polyneuropathy associated with type 2 diabetes mellitus (HCC) 11/08/2017  . Medication refill 11/08/2017  . DM (diabetes mellitus) type 2, uncontrolled, with ketoacidosis (HCC)   . Hyperthyroidism   . Postural dizziness with presyncope   . Dizziness 10/14/2017  . Microcytic anemia 06/16/2015  . SOB (shortness of breath) 05/05/2015  . PND (paroxysmal nocturnal dyspnea) 05/05/2015  . Fatigue 05/05/2015  . HTN (hypertension) 12/11/2014  . Abnormal findings on antenatal screening   . Type 2 diabetes mellitus without complication (HCC) 03/30/2014  . Morbid obesity (HCC) 03/30/2014  . Fibromyalgia 01/08/2013    Past Surgical History:  Procedure Laterality Date  . ABDOMINAL HYSTERECTOMY    . BREAST REDUCTION SURGERY    . ovarian torsion    . TONSILLECTOMY      OB History    Gravida  3   Para  2   Term  2   Preterm  0   AB  0   Living  2     SAB  0   TAB  0   Ectopic  0   Multiple  0   Live Births  1            Home Medications    Prior to Admission medications   Medication Sig Start Date End Date Taking? Authorizing Provider  Cholecalciferol 25 MCG (1000 UT) tablet Take by mouth. 11/11/19 11/10/20 Yes [provider]  ferrous sulfate 325 (65 FE) MG EC tablet Take by mouth. 04/09/15  Yes [provider]  insulin glargine (LANTUS SOLOSTAR) 100 UNIT/ML Solostar Pen Inject into the skin. 11/11/19 11/20/20 Yes [provider]  acetaminophen (TYLENOL) 500 MG tablet Take 500 mg by mouth every 6 (six) hours as needed.    [provider]  aspirin 81 MG chewable tablet Chew 81 mg by mouth daily. TAKE 1 TABLET BY MOUTH ONCE DAILY.     [provider]  atenolol (TENORMIN) 25 MG tablet Take 0.5 tablets (12.5 mg total) by mouth daily. Patient taking differently: Take 25 mg by mouth daily.  09/10/18   Grayce Sessions, NP  atorvastatin (LIPITOR) 10 MG tablet Take 1 tablet (10 mg total) by mouth daily. 10/02/18   Grayce Sessions, NP  Blood Glucose Monitoring Suppl (TRUE METRIX METER) DEVI 1 each by Does not apply route daily. 11/08/17   Loletta Specter, PA-C  busPIRone (BUSPAR) 5 MG tablet Take 1 tablet (5 mg total) by mouth 2 (two) times daily. Patient not taking: Reported on 12/26/2018 11/26/18   Grayce Sessions, NP  cetirizine (ZYRTEC) 10 MG tablet Take 1 tablet (10 mg total) by mouth daily. 04/02/18   Loletta Specter, PA-C  clotrimazole-betamethasone (LOTRISONE) cream Apply 1 application topically 2 (two) times daily. 02/24/19   Grayce Sessions, NP  doxylamine, Sleep, (UNISOM) 25 MG tablet Take 25 mg by mouth at bedtime as needed.    [provider]  gabapentin (NEURONTIN) 300 MG capsule TAKE 3 CAPSULES (900 MG TOTAL) BY MOUTH 3 (THREE) TIMES DAILY FOR 30 DAYS. 09/16/19 10/16/19  Grayce Sessions, NP  glucose blood test strip Use TID 11/08/17   Loletta Specter, PA-C  hydrOXYzine (ATARAX/VISTARIL) 25 MG tablet Take 1 tablet (25 mg total) by mouth 3 (three) times daily as needed for anxiety. 10/30/18   Grayce Sessions, NP  Insulin Glargine (BASAGLAR KWIKPEN) 100 UNIT/ML SMARTSIG:10 Unit(s) SUB-Q Daily 10/30/19   [provider]  Lancets MISC 1 each by Does not apply route 3 (three) times daily. 11/08/17   Loletta Specter, PA-C  lisinopril (ZESTRIL) 5 MG tablet Take 1 tablet (5 mg total) by mouth daily for 30 days. 10/02/18 12/26/18  Grayce Sessions, NP  MELATONIN PO Take by mouth.    [provider]  metFORMIN (GLUCOPHAGE) 500 MG tablet Take 1 tablet (500 mg total) by mouth 2 (two) times daily with a meal. 10/02/18   Grayce Sessions, NP  methimazole (TAPAZOLE) 10 MG tablet  Take 1 tablet twice daily Patient taking differently: Take 2 tablets by mouth in the morning and 1 tablet in the afternoon. 04/26/18   Reather Littler, MD  metroNIDAZOLE (FLAGYL) 500 MG tablet TAKE 1 TABLET (500 MG TOTAL) BY MOUTH 2 (TWO) TIMES DAILY. 01/05/19   Grayce Sessions, NP  omeprazole (PRILOSEC) 20 MG capsule TAKE 2 CAPSULES BY MOUTH DAILY 09/16/19   Grayce Sessions, NP  polyethylene glycol [facility] / GLYCOLAX) packet Take  17 g by mouth daily.    [provider]  senna-docusate (SENOKOT S) 8.6-50 MG tablet Take 1 tablet by mouth daily. 11/26/18   Grayce SessionsEdwards, Michelle P, NP    Family History Family History  Problem Relation Age of Onset  . Diabetes Mother   . Stroke Mother   . Hypertension Mother   . Depression Mother   . Thyroid disease Mother   . Diabetes Father   . Stroke Father   . Kidney disease Father   . Hypertension Father     Social History Social History   Tobacco Use  . Smoking status: Never Smoker  . Smokeless tobacco: Never Used  Substance Use Topics  . Alcohol use: No  . Drug use: No     Allergies   Decadron [dexamethasone]   Review of Systems Review of Systems  Constitutional: Negative for activity change, appetite change, chills, fatigue and fever.  HENT: Positive for sore throat. Negative for congestion, ear pain, rhinorrhea, sinus pressure and trouble swallowing.   Eyes: Negative for discharge and redness.  Respiratory: Negative for cough, chest tightness and shortness of breath.   Cardiovascular: Negative for chest pain.  Gastrointestinal: Negative for abdominal pain, diarrhea, nausea and vomiting.  Musculoskeletal: Negative for myalgias.  Skin: Negative for rash.  Neurological: Negative for dizziness, light-headedness and headaches.     Physical Exam Triage Vital Signs ED Triage Vitals  Enc Vitals Group     BP 11/20/19 1908 (!) 141/78     Pulse Rate 11/20/19 1908 62     Resp 11/20/19 1908 18     Temp 11/20/19 1908 98 F (36.7  C)     Temp Source 11/20/19 1908 Oral     SpO2 11/20/19 1908 98 %     Weight --      Height --      Head Circumference --      Peak Flow --      Pain Score 11/20/19 1902 6     Pain Loc --      Pain Edu? --      Excl. in GC? --    No data found.  Updated Vital Signs BP (!) 141/78 (BP Location: Right Arm)   Pulse 62   Temp 98 F (36.7 C) (Oral)   Resp 18   LMP 08/05/2014   SpO2 98%   Visual Acuity Right Eye Distance:   Left Eye Distance:   Bilateral Distance:    Right Eye Near:   Left Eye Near:    Bilateral Near:     Physical Exam Vitals and nursing note reviewed.  Constitutional:      Appearance: She is well-developed.     Comments: No acute distress  HENT:     Head: Normocephalic and atraumatic.     Ears:     Comments: Bilateral ears without tenderness to palpation of external auricle, tragus and mastoid, EAC's without erythema or swelling, TM's with good bony landmarks and cone of light. Non erythematous.     Nose: Nose normal.     Mouth/Throat:     Comments: Oral mucosa pink and moist, no tonsillar enlargement or exudate. Posterior pharynx patent and nonerythematous, no uvula deviation or swelling. Normal phonation.  Eyes:     Conjunctiva/sclera: Conjunctivae normal.  Cardiovascular:     Rate and Rhythm: Normal rate.  Pulmonary:     Effort: Pulmonary effort is normal. No respiratory distress.     Comments: Breathing comfortably at rest, CTABL, no wheezing, rales  or other adventitious sounds auscultated  Abdominal:     General: There is no distension.  Musculoskeletal:        General: Normal range of motion.     Cervical back: Neck supple.  Skin:    General: Skin is warm and dry.  Neurological:     Mental Status: She is alert and oriented to person, place, and time.      UC Treatments / Results  Labs (all labs ordered are listed, but only abnormal results are displayed) Labs Reviewed  CULTURE, GROUP A STREP [facility])  POCT RAPID STREP A     EKG   Radiology No results found.  Procedures Procedures (including critical care time)  Medications Ordered in UC Medications - No data to display  Initial Impression / Assessment and Plan / UC Course  I have reviewed the triage vital signs and the nursing notes.  Pertinent labs & imaging results that were available during my care of the patient were reviewed by me and considered in my medical decision making (see chart for details).     Strep test negative, appearance of throat not consistent with strep/pharyngitis/tonsillitis.  Recommending symptomatic and supportive care, rest and fluids.  May be related to postnasal drainage, initiate daily antihistamine.  Continue to monitor.  Discussed strict return precautions. Patient verbalized understanding and is agreeable with plan.  Final Clinical Impressions(s) / UC Diagnoses   Final diagnoses:  Sore throat     Discharge Instructions     Sore Throat  Your rapid strep tested Negative today. Most likely viral or related to post nasal drainage  Please continue Tylenol or Ibuprofen for fever and pain. May try salt water gargles, cepacol lozenges, throat spray, or OTC cold relief medicine for throat discomfort. If you also have congestion take a daily anti-histamine like Zyrtec, Claritin, and a oral decongestant to help with post nasal drip that may be irritating your throat.   Stay hydrated and drink plenty of fluids to keep your throat coated relieve irritation.    ED Prescriptions    None     PDMP not reviewed this encounter.   Lew Dawes, [place] 11/21/19 1209

## 2019-11-23 LAB — CULTURE, GROUP A STREP (THRC)

## 2019-12-23 MED FILL — methIMAzole 10 MG TABS: 10 | 30 days supply | Qty: 120 | Fill #3

## 2019-12-23 MED FILL — BASAGLAR 100 UNIT/ML KWIKPE: 100 | 30 days supply | Qty: 3 | Fill #1

## 2020-01-09 ENCOUNTER — Other Ambulatory Visit: Payer: Self-pay | Admitting: Internal Medicine

## 2020-01-09 MED FILL — ATORVASTATIN CALCIUM 20 MG: 20 | 30 days supply | Qty: 30 | Fill #0

## 2020-02-03 ENCOUNTER — Other Ambulatory Visit: Payer: Self-pay | Admitting: Endocrinology

## 2020-02-03 MED FILL — !VICTOZA 18MG/3ML INJECT: 18 | 30 days supply | Qty: 3 | Fill #0

## 2020-02-03 MED FILL — TRUEplus 5-BEVEL PEN NEEDLE: 32G X 4 MM | 25 days supply | Qty: 100 | Fill #0

## 2020-02-03 MED FILL — methIMAzole 10 MG TABS: 10 | 45 days supply | Qty: 90 | Fill #0

## 2020-02-06 ENCOUNTER — Other Ambulatory Visit: Payer: Self-pay | Admitting: Internal Medicine

## 2020-02-06 MED FILL — OMEPRAZOLE 20 MG CAP: 20 | 30 days supply | Qty: 30 | Fill #0

## 2020-02-06 MED FILL — DICLOFENAC SODIUM 1% GEL: 1 | 12 days supply | Qty: 100 | Fill #0

## 2020-02-11 MED FILL — FERROUS SULFATE 325 MG TAB: 325 (65 FE) | 30 days supply | Qty: 15 | Fill #0

## 2020-02-20 MED FILL — ATORVASTATIN CALCIUM 20 MG: 20 | 30 days supply | Qty: 30 | Fill #0

## 2020-04-02 ENCOUNTER — Other Ambulatory Visit: Payer: Self-pay | Admitting: Internal Medicine

## 2020-04-02 ENCOUNTER — Other Ambulatory Visit (INDEPENDENT_AMBULATORY_CARE_PROVIDER_SITE_OTHER): Payer: Self-pay | Admitting: Primary Care

## 2020-04-02 DIAGNOSIS — E1142 Type 2 diabetes mellitus with diabetic polyneuropathy: Secondary | ICD-10-CM

## 2020-04-02 MED FILL — OMEPRAZOLE 20 MG CAP: 20 | 42 days supply | Qty: 42 | Fill #0

## 2020-04-02 MED FILL — GABAPENTIN 300 MG CAPSULE: 300 | 30 days supply | Qty: 30 | Fill #0

## 2020-04-02 MED FILL — AMITRIPTYLINE HCL 10 MG TAB: 10 | 30 days supply | Qty: 30 | Fill #0

## 2020-04-02 NOTE — Telephone Encounter (Signed)
Sent to PCP to refill if appropriate.  

## 2020-04-06 ENCOUNTER — Other Ambulatory Visit (INDEPENDENT_AMBULATORY_CARE_PROVIDER_SITE_OTHER): Payer: Self-pay | Admitting: Primary Care

## 2020-04-06 MED ORDER — GABAPENTIN 300 MG PO CAPS: 900.0000 mg | ORAL_CAPSULE | Freq: Three times a day (TID) | ORAL | 0 refills | Status: DC

## 2020-04-06 MED ORDER — OMEPRAZOLE 20 MG PO CPDR: 40.0000 mg | DELAYED_RELEASE_CAPSULE | Freq: Every day | ORAL | 0 refills | Status: DC

## 2020-04-06 MED FILL — GABAPENTIN 300 MG CAPSULE: 300 | 30 days supply | Qty: 270 | Fill #0

## 2020-05-28 ENCOUNTER — Telehealth: Payer: Self-pay

## 2020-05-28 ENCOUNTER — Other Ambulatory Visit: Payer: Self-pay | Admitting: Internal Medicine

## 2020-05-28 MED FILL — DULoxetine HCL 30 MG CPEP: 30 | 15 days supply | Qty: 30 | Fill #0

## 2020-05-28 NOTE — Telephone Encounter (Signed)
ERROR

## 2020-08-09 ENCOUNTER — Other Ambulatory Visit: Payer: Self-pay

## 2020-08-09 MED ORDER — METHOCARBAMOL 750 MG PO TABS
ORAL_TABLET | ORAL | 0 refills | Status: DC
  Filled 2020-08-09: qty 30, 8d supply, fill #0
  Filled 2020-08-17 – 2020-08-31 (×2): qty 30, 7d supply, fill #0

## 2020-08-16 ENCOUNTER — Other Ambulatory Visit: Payer: Self-pay

## 2020-08-17 ENCOUNTER — Other Ambulatory Visit (INDEPENDENT_AMBULATORY_CARE_PROVIDER_SITE_OTHER): Payer: Self-pay

## 2020-08-17 MED FILL — Atorvastatin Calcium Tab 20 MG (Base Equivalent): ORAL | 30 days supply | Qty: 30 | Fill #0 | Status: CN

## 2020-08-17 MED FILL — Insulin Glargine Soln Pen-Injector 100 Unit/ML: SUBCUTANEOUS | 30 days supply | Qty: 3 | Fill #0 | Status: CN

## 2020-08-17 MED FILL — Methimazole Tab 10 MG: ORAL | 30 days supply | Qty: 60 | Fill #0 | Status: CN

## 2020-08-17 MED FILL — Duloxetine HCl Enteric Coated Pellets Cap 30 MG (Base Eq): ORAL | 15 days supply | Qty: 30 | Fill #0 | Status: CN

## 2020-08-18 ENCOUNTER — Other Ambulatory Visit: Payer: Self-pay

## 2020-08-19 ENCOUNTER — Other Ambulatory Visit: Payer: Self-pay

## 2020-08-20 ENCOUNTER — Other Ambulatory Visit: Payer: Self-pay

## 2020-08-20 ENCOUNTER — Other Ambulatory Visit (HOSPITAL_COMMUNITY): Payer: Self-pay

## 2020-08-23 ENCOUNTER — Other Ambulatory Visit: Payer: Self-pay

## 2020-08-23 ENCOUNTER — Other Ambulatory Visit (INDEPENDENT_AMBULATORY_CARE_PROVIDER_SITE_OTHER): Payer: Self-pay

## 2020-08-25 ENCOUNTER — Other Ambulatory Visit: Payer: Self-pay

## 2020-08-31 ENCOUNTER — Other Ambulatory Visit: Payer: Self-pay

## 2020-08-31 MED FILL — Insulin Glargine Soln Pen-Injector 100 Unit/ML: SUBCUTANEOUS | 28 days supply | Qty: 3 | Fill #0 | Status: AC

## 2020-08-31 MED FILL — Methimazole Tab 10 MG: ORAL | 30 days supply | Qty: 30 | Fill #0 | Status: AC

## 2020-08-31 MED FILL — Insulin Glargine Soln Pen-Injector 100 Unit/ML: SUBCUTANEOUS | 30 days supply | Qty: 3 | Fill #0 | Status: CN

## 2020-09-15 ENCOUNTER — Other Ambulatory Visit: Payer: Self-pay

## 2020-09-28 ENCOUNTER — Other Ambulatory Visit: Payer: Self-pay

## 2020-09-28 ENCOUNTER — Other Ambulatory Visit (INDEPENDENT_AMBULATORY_CARE_PROVIDER_SITE_OTHER): Payer: Self-pay | Admitting: Primary Care

## 2020-09-28 MED FILL — Omeprazole Cap Delayed Release 20 MG: ORAL | Qty: 42 | Fill #0 | Status: CN

## 2020-09-28 MED FILL — Insulin Pen Needle 32 G X 4 MM (1/6" or 5/32"): 90 days supply | Qty: 100 | Fill #0 | Status: CN

## 2020-09-28 MED FILL — Gabapentin Cap 300 MG: ORAL | 30 days supply | Qty: 30 | Fill #0 | Status: CN

## 2020-09-28 MED FILL — Duloxetine HCl Enteric Coated Pellets Cap 30 MG (Base Eq): ORAL | 30 days supply | Qty: 60 | Fill #0 | Status: CN

## 2020-09-28 NOTE — Telephone Encounter (Signed)
   Notes to clinic: medication filled by a different provider  Review for continued use and new script    Requested Prescriptions  Pending Prescriptions Disp Refills   omeprazole (PRILOSEC) 20 MG capsule 42 capsule 0    Sig: TAKE 1 CAPSULE (20 MG TOTAL) BY MOUTH DAILY.      Gastroenterology: Proton Pump Inhibitors Failed - 09/28/2020 11:12 AM      Failed - Valid encounter within last 12 months    Recent Outpatient Visits           1 year ago Hyperthyroidism   [facility] Grayce Sessions, NP   1 year ago Essential hypertension   [facility] Grayce Sessions, NP   1 year ago COVID-19 virus infection   [facility] Grayce Sessions, NP   1 year ago Contact dermatitis or eczema   [facility] Grayce Sessions, NP   1 year ago Diabetic polyneuropathy associated with type 2 diabetes mellitus (HCC)   [facility] Grayce Sessions, NP

## 2020-10-04 ENCOUNTER — Other Ambulatory Visit: Payer: Self-pay

## 2020-10-04 MED ORDER — OMEPRAZOLE 20 MG PO CPDR
DELAYED_RELEASE_CAPSULE | Freq: Every day | ORAL | 0 refills | Status: AC
  Filled 2020-10-04: qty 42, 42d supply, fill #0

## 2020-10-05 ENCOUNTER — Other Ambulatory Visit: Payer: Self-pay

## 2020-10-08 ENCOUNTER — Other Ambulatory Visit: Payer: Self-pay

## 2020-10-08 MED FILL — Methimazole Tab 10 MG: ORAL | 90 days supply | Qty: 90 | Fill #1 | Status: CN

## 2020-10-11 ENCOUNTER — Other Ambulatory Visit: Payer: Self-pay

## 2020-10-11 MED FILL — Methimazole Tab 10 MG: ORAL | 30 days supply | Qty: 60 | Fill #1 | Status: AC

## 2020-11-26 ENCOUNTER — Other Ambulatory Visit: Payer: Self-pay

## 2020-11-26 MED FILL — Methimazole Tab 10 MG: ORAL | 30 days supply | Qty: 60 | Fill #2 | Status: AC

## 2020-12-29 ENCOUNTER — Other Ambulatory Visit: Payer: Self-pay

## 2020-12-29 MED FILL — Insulin Pen Needle 32 G X 4 MM (1/6" or 5/32"): 90 days supply | Qty: 100 | Fill #0 | Status: AC

## 2021-01-03 ENCOUNTER — Other Ambulatory Visit: Payer: Self-pay

## 2021-01-03 MED ORDER — TRUE METRIX BLOOD GLUCOSE TEST VI STRP
ORAL_STRIP | 12 refills | Status: AC
  Filled 2021-01-03 – 2021-01-20 (×2): qty 100, 33d supply, fill #0
  Filled 2021-03-03: qty 100, 33d supply, fill #1
  Filled 2021-05-27: qty 100, 33d supply, fill #0

## 2021-01-10 ENCOUNTER — Other Ambulatory Visit: Payer: Self-pay

## 2021-01-19 ENCOUNTER — Other Ambulatory Visit: Payer: Self-pay

## 2021-01-19 MED FILL — Methimazole Tab 10 MG: ORAL | 30 days supply | Qty: 60 | Fill #3 | Status: AC

## 2021-01-20 ENCOUNTER — Other Ambulatory Visit: Payer: Self-pay

## 2021-02-04 ENCOUNTER — Other Ambulatory Visit: Payer: Self-pay

## 2021-02-04 MED ORDER — JARDIANCE 10 MG PO TABS
ORAL_TABLET | ORAL | 3 refills | Status: AC
  Filled 2021-02-04 (×4): qty 30, 30d supply, fill #0
  Filled 2021-04-21: qty 30, 30d supply, fill #1

## 2021-02-07 ENCOUNTER — Other Ambulatory Visit: Payer: Self-pay

## 2021-02-08 ENCOUNTER — Other Ambulatory Visit: Payer: Self-pay

## 2021-02-08 MED ORDER — INSULIN GLARGINE 100 UNIT/ML ~~LOC~~ SOLN
SUBCUTANEOUS | 1 refills | Status: AC
  Filled 2021-02-08: qty 10, 28d supply, fill #0

## 2021-02-09 ENCOUNTER — Other Ambulatory Visit: Payer: Self-pay

## 2021-03-03 ENCOUNTER — Other Ambulatory Visit (INDEPENDENT_AMBULATORY_CARE_PROVIDER_SITE_OTHER): Payer: Self-pay

## 2021-03-03 ENCOUNTER — Other Ambulatory Visit: Payer: Self-pay

## 2021-03-03 MED FILL — Gabapentin Cap 300 MG: ORAL | 30 days supply | Qty: 30 | Fill #0 | Status: AC

## 2021-03-11 ENCOUNTER — Other Ambulatory Visit: Payer: Self-pay

## 2021-03-11 MED ORDER — ALBUTEROL SULFATE HFA 108 (90 BASE) MCG/ACT IN AERS
INHALATION_SPRAY | RESPIRATORY_TRACT | 0 refills | Status: AC
  Filled 2021-03-11: qty 8.5, 25d supply, fill #0

## 2021-03-11 MED ORDER — TRUEPLUS 5-BEVEL PEN NEEDLES 31G X 8 MM MISC
11 refills | Status: AC
  Filled 2021-03-11: qty 100, 25d supply, fill #0

## 2021-03-11 MED ORDER — INSULIN LISPRO (1 UNIT DIAL) 100 UNIT/ML (KWIKPEN)
PEN_INJECTOR | SUBCUTANEOUS | 0 refills | Status: AC
  Filled 2021-03-11: qty 6, 24d supply, fill #0

## 2021-03-15 ENCOUNTER — Other Ambulatory Visit: Payer: Self-pay

## 2021-03-23 ENCOUNTER — Other Ambulatory Visit: Payer: Self-pay

## 2021-03-23 MED ORDER — METHIMAZOLE 10 MG PO TABS
ORAL_TABLET | Freq: Every day | ORAL | 3 refills | Status: AC
  Filled 2021-03-23 – 2021-05-19 (×3): qty 60, 30d supply, fill #0
  Filled 2021-05-19: qty 60, 30d supply, fill #1

## 2021-03-29 ENCOUNTER — Other Ambulatory Visit: Payer: Self-pay

## 2021-03-29 MED ORDER — DULOXETINE HCL 60 MG PO CPEP
ORAL_CAPSULE | ORAL | 11 refills | Status: AC
  Filled 2021-03-29 (×2): qty 30, 30d supply, fill #0

## 2021-03-29 MED ORDER — ATORVASTATIN CALCIUM 10 MG PO TABS
10.0000 mg | ORAL_TABLET | Freq: Every day | ORAL | 3 refills | Status: DC
  Filled 2021-03-29 – 2021-05-18 (×2): qty 30, 30d supply, fill #0
  Filled 2021-05-18 – 2021-07-11 (×2): qty 30, 30d supply, fill #1
  Filled 2021-08-22: qty 30, 30d supply, fill #2

## 2021-03-29 MED ORDER — FAMOTIDINE 20 MG PO TABS
ORAL_TABLET | ORAL | 1 refills | Status: AC
  Filled 2021-03-29: qty 60, 30d supply, fill #0

## 2021-03-29 MED ORDER — JARDIANCE 25 MG PO TABS
ORAL_TABLET | ORAL | 11 refills | Status: AC
  Filled 2021-03-29: qty 30, 30d supply, fill #0

## 2021-03-30 ENCOUNTER — Other Ambulatory Visit: Payer: Self-pay

## 2021-03-30 MED ORDER — DULOXETINE HCL 30 MG PO CPEP
30.0000 mg | ORAL_CAPSULE | Freq: Every morning | ORAL | 0 refills | Status: AC
  Filled 2021-03-30: qty 7, 7d supply, fill #0

## 2021-04-01 ENCOUNTER — Other Ambulatory Visit: Payer: Self-pay

## 2021-04-21 ENCOUNTER — Other Ambulatory Visit: Payer: Self-pay

## 2021-04-27 ENCOUNTER — Other Ambulatory Visit: Payer: Self-pay

## 2021-04-27 MED ORDER — JARDIANCE 25 MG PO TABS
ORAL_TABLET | ORAL | 11 refills | Status: AC
  Filled 2021-05-02: qty 30, 30d supply, fill #0
  Filled 2021-07-08 – 2021-08-22 (×2): qty 30, 30d supply, fill #1
  Filled 2021-09-17: qty 30, 30d supply, fill #2
  Filled 2021-09-21: qty 14, 14d supply, fill #2

## 2021-04-27 MED ORDER — SERTRALINE HCL 25 MG PO TABS
ORAL_TABLET | ORAL | 1 refills | Status: AC
  Filled 2021-05-02: qty 30, 22d supply, fill #0
  Filled 2021-05-19: qty 30, 22d supply, fill #1

## 2021-04-27 MED ORDER — CETIRIZINE HCL 10 MG PO TABS: 10.0000 mg | ORAL_TABLET | Freq: Every day | ORAL | 3 refills | Status: AC

## 2021-05-02 ENCOUNTER — Other Ambulatory Visit: Payer: Self-pay

## 2021-05-11 ENCOUNTER — Other Ambulatory Visit: Payer: Self-pay

## 2021-05-11 MED ORDER — SERTRALINE HCL 25 MG PO TABS: ORAL_TABLET | ORAL | 11 refills | Status: AC

## 2021-05-18 ENCOUNTER — Other Ambulatory Visit: Payer: Self-pay

## 2021-05-19 ENCOUNTER — Other Ambulatory Visit: Payer: Self-pay

## 2021-05-25 ENCOUNTER — Other Ambulatory Visit: Payer: Self-pay

## 2021-05-25 MED ORDER — GABAPENTIN 300 MG PO CAPS
ORAL_CAPSULE | ORAL | 0 refills | Status: AC
  Filled 2021-05-25: qty 90, 30d supply, fill #0
  Filled 2021-10-17: qty 90, 30d supply, fill #1

## 2021-05-25 MED ORDER — TRUEPLUS LANCETS 28G MISC
12 refills | Status: AC
  Filled 2021-05-25: qty 100, 33d supply, fill #0

## 2021-05-25 MED ORDER — TRULICITY 0.75 MG/0.5ML ~~LOC~~ SOPN
PEN_INJECTOR | SUBCUTANEOUS | 3 refills | Status: AC
Start: 2021-05-25 — End: ?
  Filled 2021-05-25: qty 2, 28d supply, fill #0
  Filled 2021-06-19 – 2021-07-20 (×2): qty 2, 28d supply, fill #1
  Filled 2021-10-07: qty 2, 28d supply, fill #2

## 2021-05-26 ENCOUNTER — Other Ambulatory Visit: Payer: Self-pay

## 2021-05-26 MED ORDER — METHIMAZOLE 10 MG PO TABS
ORAL_TABLET | ORAL | 3 refills | Status: AC
  Filled 2021-05-26: qty 45, 30d supply, fill #0

## 2021-05-27 ENCOUNTER — Other Ambulatory Visit: Payer: Self-pay

## 2021-06-20 ENCOUNTER — Other Ambulatory Visit: Payer: Self-pay

## 2021-06-23 ENCOUNTER — Other Ambulatory Visit: Payer: Self-pay

## 2021-06-23 MED ORDER — TRULICITY 0.75 MG/0.5ML ~~LOC~~ SOAJ
SUBCUTANEOUS | 3 refills | Status: AC
  Filled 2021-06-23: qty 2, 28d supply, fill #0
  Filled 2021-08-22: qty 2, 28d supply, fill #1
  Filled 2021-11-03: qty 2, 28d supply, fill #2

## 2021-06-24 ENCOUNTER — Other Ambulatory Visit: Payer: Self-pay

## 2021-07-08 ENCOUNTER — Other Ambulatory Visit: Payer: Self-pay

## 2021-07-11 ENCOUNTER — Other Ambulatory Visit: Payer: Self-pay

## 2021-07-14 ENCOUNTER — Other Ambulatory Visit: Payer: Self-pay

## 2021-07-14 MED ORDER — HYDROXYZINE HCL 25 MG PO TABS
25.0000 mg | ORAL_TABLET | Freq: Three times a day (TID) | ORAL | 3 refills | Status: AC | PRN
  Filled 2021-07-14: qty 30, 10d supply, fill #0

## 2021-07-15 ENCOUNTER — Other Ambulatory Visit: Payer: Self-pay

## 2021-07-18 ENCOUNTER — Other Ambulatory Visit: Payer: Self-pay

## 2021-07-19 ENCOUNTER — Other Ambulatory Visit: Payer: Self-pay

## 2021-07-19 MED ORDER — SENNOSIDES 8.6 MG PO TABS: ORAL_TABLET | ORAL | 0 refills | Status: AC

## 2021-07-19 MED ORDER — BUSPIRONE HCL 5 MG PO TABS
ORAL_TABLET | ORAL | 11 refills | Status: AC
  Filled 2021-07-19: qty 90, 30d supply, fill #0

## 2021-07-19 MED ORDER — SERTRALINE HCL 50 MG PO TABS
ORAL_TABLET | ORAL | 11 refills | Status: AC
  Filled 2021-07-19 (×2): qty 60, 30d supply, fill #0

## 2021-07-20 ENCOUNTER — Other Ambulatory Visit: Payer: Self-pay

## 2021-07-27 ENCOUNTER — Other Ambulatory Visit: Payer: Self-pay

## 2021-07-27 MED ORDER — GABAPENTIN 300 MG PO CAPS
ORAL_CAPSULE | ORAL | 0 refills | Status: AC
  Filled 2021-07-27: qty 270, 90d supply, fill #0
  Filled 2021-08-22: qty 90, 30d supply, fill #0
  Filled 2021-09-17: qty 90, 30d supply, fill #1

## 2021-07-29 ENCOUNTER — Other Ambulatory Visit: Payer: Self-pay

## 2021-08-02 ENCOUNTER — Other Ambulatory Visit: Payer: Self-pay

## 2021-08-22 ENCOUNTER — Other Ambulatory Visit: Payer: Self-pay

## 2021-08-22 MED ORDER — METHIMAZOLE 5 MG PO TABS
ORAL_TABLET | ORAL | 3 refills | Status: AC
  Filled 2021-08-22: qty 90, 90d supply, fill #0

## 2021-08-25 ENCOUNTER — Other Ambulatory Visit: Payer: Self-pay

## 2021-08-26 ENCOUNTER — Other Ambulatory Visit: Payer: Self-pay

## 2021-08-31 ENCOUNTER — Other Ambulatory Visit: Payer: Self-pay

## 2021-09-09 ENCOUNTER — Other Ambulatory Visit: Payer: Self-pay

## 2021-09-17 ENCOUNTER — Other Ambulatory Visit: Payer: Self-pay

## 2021-09-20 ENCOUNTER — Other Ambulatory Visit: Payer: Self-pay

## 2021-09-20 MED ORDER — ATORVASTATIN CALCIUM 10 MG PO TABS
10.0000 mg | ORAL_TABLET | Freq: Every day | ORAL | 3 refills | Status: AC
  Filled 2021-09-20: qty 90, 90d supply, fill #0

## 2021-09-21 ENCOUNTER — Other Ambulatory Visit: Payer: Self-pay

## 2021-09-23 ENCOUNTER — Other Ambulatory Visit: Payer: Self-pay

## 2021-09-26 ENCOUNTER — Other Ambulatory Visit: Payer: Self-pay

## 2021-09-26 MED ORDER — HYDROXYZINE HCL 25 MG PO TABS
ORAL_TABLET | ORAL | 3 refills | Status: AC
  Filled 2021-09-26: qty 90, 30d supply, fill #0

## 2021-09-26 MED ORDER — LEVOTHYROXINE SODIUM 50 MCG PO TABS
50.0000 ug | ORAL_TABLET | Freq: Every day | ORAL | 3 refills | Status: DC
  Filled 2021-09-26: qty 30, 30d supply, fill #0

## 2021-09-27 ENCOUNTER — Other Ambulatory Visit: Payer: Self-pay

## 2021-09-27 MED ORDER — HYDROXYZINE HCL 25 MG PO TABS
ORAL_TABLET | ORAL | 3 refills | Status: AC
  Filled 2021-09-27: qty 90, 30d supply, fill #0

## 2021-10-04 ENCOUNTER — Other Ambulatory Visit: Payer: Self-pay

## 2021-10-04 MED ORDER — GABAPENTIN 300 MG PO CAPS
900.0000 mg | ORAL_CAPSULE | Freq: Every evening | ORAL | 0 refills | Status: AC
  Filled 2021-10-04: qty 90, 30d supply, fill #0
  Filled 2022-08-10: qty 270, 90d supply, fill #0
  Filled 2022-08-10: qty 90, 30d supply, fill #0

## 2021-10-07 ENCOUNTER — Other Ambulatory Visit: Payer: Self-pay

## 2021-10-07 MED ORDER — ALBUTEROL SULFATE HFA 108 (90 BASE) MCG/ACT IN AERS
INHALATION_SPRAY | RESPIRATORY_TRACT | 3 refills | Status: AC
  Filled 2021-10-07 – 2021-10-17 (×2): qty 8.5, 25d supply, fill #0

## 2021-10-14 ENCOUNTER — Other Ambulatory Visit: Payer: Self-pay

## 2021-10-17 ENCOUNTER — Other Ambulatory Visit: Payer: Self-pay

## 2021-10-20 ENCOUNTER — Other Ambulatory Visit: Payer: Self-pay

## 2021-10-20 MED ORDER — ONDANSETRON HCL 4 MG PO TABS
ORAL_TABLET | ORAL | 0 refills | Status: AC
  Filled 2021-10-20: qty 28, 28d supply, fill #0

## 2021-10-20 MED ORDER — ALBUTEROL SULFATE (2.5 MG/3ML) 0.083% IN NEBU: INHALATION_SOLUTION | RESPIRATORY_TRACT | 0 refills | Status: AC

## 2021-10-20 MED ORDER — REPLACEMENT FILTERS MISC
0 refills | Status: AC
Start: 2021-10-20 — End: ?
  Filled 2022-08-10: qty 1, fill #0

## 2021-10-20 MED ORDER — SERTRALINE HCL 50 MG PO TABS
ORAL_TABLET | ORAL | 3 refills | Status: AC
  Filled 2021-11-03: qty 90, 90d supply, fill #0

## 2021-10-20 MED ORDER — ATORVASTATIN CALCIUM 20 MG PO TABS
20.0000 mg | ORAL_TABLET | Freq: Every day | ORAL | 3 refills | Status: AC
  Filled 2021-10-20: qty 30, 30d supply, fill #0
  Filled 2021-11-28: qty 30, 30d supply, fill #1

## 2021-10-21 ENCOUNTER — Other Ambulatory Visit: Payer: Self-pay

## 2021-10-21 MED ORDER — LEVOTHYROXINE SODIUM 100 MCG PO TABS
100.0000 ug | ORAL_TABLET | Freq: Every day | ORAL | 1 refills | Status: DC
  Filled 2021-10-21: qty 30, 30d supply, fill #0

## 2021-10-26 ENCOUNTER — Other Ambulatory Visit: Payer: Self-pay

## 2021-10-26 MED ORDER — ALBUTEROL SULFATE (2.5 MG/3ML) 0.083% IN NEBU
INHALATION_SOLUTION | RESPIRATORY_TRACT | 1 refills | Status: AC
  Filled 2021-10-26: qty 150, 8d supply, fill #0
  Filled 2022-02-22: qty 180, 10d supply, fill #0

## 2021-11-01 ENCOUNTER — Other Ambulatory Visit: Payer: Self-pay

## 2021-11-03 ENCOUNTER — Other Ambulatory Visit: Payer: Self-pay

## 2021-11-03 MED ORDER — SERTRALINE HCL 25 MG PO TABS
ORAL_TABLET | ORAL | 3 refills | Status: AC
  Filled 2021-11-03: qty 74, 30d supply, fill #0

## 2021-11-03 MED ORDER — BUSPIRONE HCL 5 MG PO TABS
ORAL_TABLET | ORAL | 11 refills | Status: AC
  Filled 2021-11-03: qty 90, 30d supply, fill #0

## 2021-11-04 ENCOUNTER — Other Ambulatory Visit: Payer: Self-pay

## 2021-11-04 MED ORDER — SERTRALINE HCL 50 MG PO TABS
ORAL_TABLET | ORAL | 3 refills | Status: AC
  Filled 2021-11-04: qty 90, 90d supply, fill #0

## 2021-11-04 MED ORDER — ROPINIROLE HCL 0.25 MG PO TABS
ORAL_TABLET | ORAL | 0 refills | Status: DC
  Filled 2021-11-04: qty 30, 30d supply, fill #0

## 2021-11-07 ENCOUNTER — Other Ambulatory Visit: Payer: Self-pay

## 2021-11-28 ENCOUNTER — Other Ambulatory Visit (INDEPENDENT_AMBULATORY_CARE_PROVIDER_SITE_OTHER): Payer: Self-pay | Admitting: Primary Care

## 2021-11-28 ENCOUNTER — Other Ambulatory Visit: Payer: Self-pay

## 2021-11-28 MED ORDER — FAMOTIDINE 20 MG PO TABS
ORAL_TABLET | ORAL | 2 refills | Status: AC
  Filled 2021-11-28: qty 60, 30d supply, fill #0

## 2021-11-28 MED ORDER — PRECISION QID TEST VI STRP
ORAL_STRIP | 12 refills | Status: DC
  Filled 2021-11-28: qty 100, 33d supply, fill #0
  Filled 2022-08-10 (×2): qty 100, 33d supply, fill #1

## 2021-11-28 MED ORDER — LEVOTHYROXINE SODIUM 100 MCG PO TABS
100.0000 ug | ORAL_TABLET | Freq: Every day | ORAL | 2 refills | Status: DC
  Filled 2021-11-28: qty 30, 30d supply, fill #0

## 2021-11-29 ENCOUNTER — Other Ambulatory Visit: Payer: Self-pay

## 2021-11-29 MED ORDER — INSULIN GLARGINE 100 UNIT/ML ~~LOC~~ SOLN
SUBCUTANEOUS | 1 refills | Status: AC
  Filled 2021-11-29: qty 10, 33d supply, fill #0
  Filled 2022-02-22: qty 10, 33d supply, fill #1

## 2021-11-29 MED ORDER — ROPINIROLE HCL 0.25 MG PO TABS
ORAL_TABLET | ORAL | 0 refills | Status: DC
  Filled 2021-11-29: qty 30, 30d supply, fill #0

## 2021-11-30 ENCOUNTER — Other Ambulatory Visit: Payer: Self-pay

## 2021-12-08 ENCOUNTER — Other Ambulatory Visit: Payer: Self-pay

## 2021-12-08 MED ORDER — LEVOTHYROXINE SODIUM 125 MCG PO TABS
125.0000 ug | ORAL_TABLET | Freq: Every day | ORAL | 2 refills | Status: DC
  Filled 2021-12-08: qty 30, 30d supply, fill #0

## 2021-12-09 ENCOUNTER — Other Ambulatory Visit: Payer: Self-pay

## 2021-12-09 MED ORDER — LEVOTHYROXINE SODIUM 175 MCG PO TABS
ORAL_TABLET | ORAL | 3 refills | Status: DC
  Filled 2021-12-09: qty 90, 90d supply, fill #0
  Filled 2021-12-20: qty 30, 30d supply, fill #0
  Filled 2022-02-22: qty 30, 30d supply, fill #1

## 2021-12-15 ENCOUNTER — Other Ambulatory Visit: Payer: Self-pay

## 2021-12-20 ENCOUNTER — Other Ambulatory Visit: Payer: Self-pay

## 2021-12-27 ENCOUNTER — Other Ambulatory Visit: Payer: Self-pay

## 2021-12-27 MED ORDER — GABAPENTIN 300 MG PO CAPS
900.0000 mg | ORAL_CAPSULE | ORAL | 0 refills | Status: AC
  Filled 2021-12-27: qty 270, 90d supply, fill #0

## 2021-12-27 MED ORDER — TRULICITY 0.75 MG/0.5ML ~~LOC~~ SOAJ
SUBCUTANEOUS | 3 refills | Status: AC
  Filled 2021-12-27: qty 2, 28d supply, fill #0
  Filled 2022-02-22: qty 2, 28d supply, fill #1
  Filled 2022-04-19: qty 2, 28d supply, fill #2

## 2021-12-27 MED ORDER — LANCETS 28G MISC
4 refills | Status: AC
  Filled 2021-12-27: qty 100, 33d supply, fill #0
  Filled 2022-08-10 (×2): qty 100, 33d supply, fill #1

## 2021-12-30 ENCOUNTER — Other Ambulatory Visit: Payer: Self-pay

## 2021-12-30 MED ORDER — JARDIANCE 25 MG PO TABS
ORAL_TABLET | ORAL | 11 refills | Status: AC
Start: 2021-12-29 — End: ?

## 2022-01-12 ENCOUNTER — Other Ambulatory Visit: Payer: Self-pay

## 2022-01-13 ENCOUNTER — Other Ambulatory Visit: Payer: Self-pay

## 2022-01-13 MED ORDER — DICLOFENAC SODIUM 1 % EX GEL
2.0000 g | Freq: Four times a day (QID) | CUTANEOUS | 0 refills | Status: AC
Start: 2022-01-13 — End: ?
  Filled 2022-01-13: qty 100, 13d supply, fill #0

## 2022-01-19 ENCOUNTER — Other Ambulatory Visit: Payer: Self-pay

## 2022-02-22 ENCOUNTER — Other Ambulatory Visit: Payer: Self-pay

## 2022-02-27 ENCOUNTER — Other Ambulatory Visit: Payer: Self-pay

## 2022-02-27 MED ORDER — LEVOTHYROXINE SODIUM 200 MCG PO TABS
200.0000 ug | ORAL_TABLET | Freq: Every day | ORAL | 3 refills | Status: AC
  Filled 2022-02-27: qty 90, 90d supply, fill #0

## 2022-03-02 ENCOUNTER — Other Ambulatory Visit: Payer: Self-pay

## 2022-03-02 MED ORDER — ROPINIROLE HCL 0.25 MG PO TABS
0.2500 mg | ORAL_TABLET | Freq: Every evening | ORAL | 3 refills | Status: AC
  Filled 2022-03-02: qty 30, 30d supply, fill #0
  Filled 2022-04-19: qty 90, 90d supply, fill #0

## 2022-03-09 ENCOUNTER — Other Ambulatory Visit: Payer: Self-pay

## 2022-04-07 ENCOUNTER — Other Ambulatory Visit: Payer: Self-pay

## 2022-04-07 MED ORDER — TRULICITY 0.75 MG/0.5ML ~~LOC~~ SOAJ
0.7500 mg | SUBCUTANEOUS | 3 refills | Status: AC
  Filled 2022-04-07 – 2022-05-29 (×2): qty 2, 28d supply, fill #0
  Filled 2022-06-20 – 2022-06-22 (×3): qty 2, 28d supply, fill #1

## 2022-04-12 ENCOUNTER — Other Ambulatory Visit: Payer: Self-pay

## 2022-04-12 MED ORDER — ALBUTEROL SULFATE HFA 108 (90 BASE) MCG/ACT IN AERS
INHALATION_SPRAY | RESPIRATORY_TRACT | 3 refills | Status: AC
  Filled 2022-04-12: qty 6.7, 25d supply, fill #0

## 2022-04-12 MED ORDER — BUSPIRONE HCL 5 MG PO TABS
ORAL_TABLET | ORAL | 11 refills | Status: AC
  Filled 2022-04-12: qty 90, 30d supply, fill #0

## 2022-04-12 MED ORDER — SERTRALINE HCL 50 MG PO TABS
50.0000 mg | ORAL_TABLET | Freq: Every day | ORAL | 3 refills | Status: AC
Start: 2022-04-12 — End: ?
  Filled 2022-04-12: qty 30, 30d supply, fill #0

## 2022-04-12 MED ORDER — GABAPENTIN 300 MG PO CAPS
900.0000 mg | ORAL_CAPSULE | Freq: Every evening | ORAL | 0 refills | Status: AC
Start: 2022-04-12 — End: ?
  Filled 2022-04-12: qty 90, 30d supply, fill #0
  Filled 2023-01-11 – 2023-01-12 (×2): qty 90, 30d supply, fill #1

## 2022-04-13 ENCOUNTER — Other Ambulatory Visit: Payer: Self-pay

## 2022-04-18 ENCOUNTER — Other Ambulatory Visit: Payer: Self-pay

## 2022-04-18 MED ORDER — HYDROXYZINE HCL 25 MG PO TABS
ORAL_TABLET | ORAL | 3 refills | Status: AC
  Filled 2022-04-18: qty 90, 30d supply, fill #0

## 2022-04-19 ENCOUNTER — Other Ambulatory Visit: Payer: Self-pay

## 2022-04-20 ENCOUNTER — Other Ambulatory Visit: Payer: Self-pay

## 2022-04-20 MED ORDER — LEVOTHYROXINE SODIUM 25 MCG PO TABS
ORAL_TABLET | ORAL | 3 refills | Status: AC
  Filled 2022-04-20 – 2022-05-10 (×2): qty 90, 90d supply, fill #0
  Filled 2022-11-04: qty 90, 90d supply, fill #1

## 2022-04-20 MED ORDER — JARDIANCE 25 MG PO TABS: 25.0000 mg | ORAL_TABLET | Freq: Every morning | ORAL | 11 refills | Status: AC

## 2022-04-27 ENCOUNTER — Other Ambulatory Visit: Payer: Self-pay

## 2022-04-28 ENCOUNTER — Other Ambulatory Visit: Payer: Self-pay

## 2022-05-10 ENCOUNTER — Other Ambulatory Visit: Payer: Self-pay

## 2022-05-29 ENCOUNTER — Other Ambulatory Visit: Payer: Self-pay

## 2022-05-30 ENCOUNTER — Other Ambulatory Visit: Payer: Self-pay

## 2022-05-30 MED ORDER — TRULICITY 0.75 MG/0.5ML ~~LOC~~ SOAJ
SUBCUTANEOUS | 3 refills | Status: AC
  Filled 2022-05-30: qty 2, 28d supply, fill #0

## 2022-06-16 ENCOUNTER — Other Ambulatory Visit: Payer: Self-pay

## 2022-06-16 MED ORDER — TRULICITY 0.75 MG/0.5ML ~~LOC~~ SOAJ
0.7500 mg | SUBCUTANEOUS | 3 refills | Status: AC
  Filled 2022-06-16: qty 2, 28d supply, fill #0

## 2022-06-16 MED ORDER — ATORVASTATIN CALCIUM 20 MG PO TABS
20.0000 mg | ORAL_TABLET | Freq: Every day | ORAL | 3 refills | Status: DC
  Filled 2022-06-16: qty 90, 90d supply, fill #0

## 2022-06-16 MED ORDER — ROPINIROLE HCL 0.25 MG PO TABS
0.2500 mg | ORAL_TABLET | Freq: Every evening | ORAL | 3 refills | Status: AC
  Filled 2022-06-16: qty 90, 90d supply, fill #0

## 2022-06-16 MED ORDER — JARDIANCE 25 MG PO TABS: 25.0000 mg | ORAL_TABLET | Freq: Every morning | ORAL | 11 refills | Status: AC

## 2022-06-16 MED ORDER — SERTRALINE HCL 100 MG PO TABS
100.0000 mg | ORAL_TABLET | Freq: Every day | ORAL | 3 refills | Status: AC
  Filled 2022-06-16: qty 90, 90d supply, fill #0
  Filled 2022-12-27 – 2023-01-12 (×4): qty 90, 90d supply, fill #1

## 2022-06-20 ENCOUNTER — Other Ambulatory Visit: Payer: Self-pay

## 2022-06-22 ENCOUNTER — Other Ambulatory Visit: Payer: Self-pay

## 2022-06-27 ENCOUNTER — Other Ambulatory Visit: Payer: Self-pay

## 2022-06-30 ENCOUNTER — Other Ambulatory Visit: Payer: Self-pay

## 2022-07-12 ENCOUNTER — Emergency Department (HOSPITAL_COMMUNITY): Payer: No Typology Code available for payment source

## 2022-07-12 ENCOUNTER — Emergency Department (HOSPITAL_COMMUNITY)
Admission: EM | Admit: 2022-07-12 | Discharge: 2022-07-12 | Disposition: A | Discharge status: Home / Self Care | Payer: No Typology Code available for payment source | Attending: Emergency Medicine | Admitting: Emergency Medicine

## 2022-07-12 DIAGNOSIS — R109 Unspecified abdominal pain: Secondary | ICD-10-CM | POA: Insufficient documentation

## 2022-07-12 DIAGNOSIS — M25512 Pain in left shoulder: Secondary | ICD-10-CM

## 2022-07-12 DIAGNOSIS — Y92410 Unspecified street and highway as the place of occurrence of the external cause: Secondary | ICD-10-CM | POA: Diagnosis not present

## 2022-07-12 LAB — COMPREHENSIVE METABOLIC PANEL
ALT: 12 U/L (ref 0–44)
AST: 15 U/L (ref 15–41)
Albumin: 3.8 g/dL (ref 3.5–5.0)
Alkaline Phosphatase: 98 U/L (ref 38–126)
Anion gap: 11 (ref 5–15)
BUN: 18 mg/dL (ref 6–20)
CO2: 24 mmol/L (ref 22–32)
Calcium: 8.9 mg/dL (ref 8.9–10.3)
Chloride: 102 mmol/L (ref 98–111)
Creatinine, Ser: 0.71 mg/dL (ref 0.44–1.00)
GFR, Estimated: >60 mL/min (ref >60)
Glucose, Bld: 89 mg/dL (ref 70–99)
Potassium: 3.9 mmol/L (ref 3.5–5.1)
Sodium: 137 mmol/L (ref 135–145)
Total Bilirubin: 0.5 mg/dL (ref 0.3–1.2)
Total Protein: 7.3 g/dL (ref 6.5–8.1)

## 2022-07-12 LAB — CBC
HCT: 41.9 % (ref 36.0–46.0)
Hemoglobin: 13.3 g/dL (ref 12.0–15.0)
MCH: 26.8 pg (ref 26.0–34.0)
MCHC: 31.7 g/dL (ref 30.0–36.0)
MCV: 84.5 fL (ref 80.0–100.0)
Platelets: 261 10*3/uL (ref 150–400)
RBC: 4.96 MIL/uL (ref 3.87–5.11)
RDW: 14.2 % (ref 11.5–15.5)
WBC: 10.9 10*3/uL — ABNORMAL HIGH (ref 4.0–10.5)
nRBC: 0 % (ref 0.0–0.2)

## 2022-07-12 LAB — I-STAT CHEM 8, ED
BUN: 19 mg/dL (ref 6–20)
Calcium, Ion: 1.12 mmol/L — ABNORMAL LOW (ref 1.15–1.40)
Chloride: 102 mmol/L (ref 98–111)
Creatinine, Ser: 0.6 mg/dL (ref 0.44–1.00)
Glucose, Bld: 94 mg/dL (ref 70–99)
HCT: 42 % (ref 36.0–46.0)
Hemoglobin: 14.3 g/dL (ref 12.0–15.0)
Potassium: 3.6 mmol/L (ref 3.5–5.1)
Sodium: 139 mmol/L (ref 135–145)
TCO2: 26 mmol/L (ref 22–32)

## 2022-07-12 LAB — PROTIME-INR
INR: 1.1 (ref 0.8–1.2)
Prothrombin Time: 14.4 seconds (ref 11.4–15.2)

## 2022-07-12 LAB — ETHANOL: Alcohol, Ethyl (B): <10 mg/dL (ref <10)

## 2022-07-12 LAB — SAMPLE TO BLOOD BANK

## 2022-07-12 LAB — LACTIC ACID, PLASMA: Lactic Acid, Venous: 0.8 mmol/L (ref 0.5–1.9)

## 2022-07-12 MED ORDER — ACETAMINOPHEN 500 MG PO TABS: 1000.0000 mg | ORAL_TABLET | Freq: Once | ORAL | Status: DC

## 2022-07-12 MED ORDER — KETOROLAC TROMETHAMINE 15 MG/ML IJ SOLN: 15.0000 mg | Freq: Once | INTRAMUSCULAR | Status: DC

## 2022-07-12 MED ORDER — FENTANYL CITRATE PF 50 MCG/ML IJ SOSY
50.0000 ug | PREFILLED_SYRINGE | Freq: Once | INTRAMUSCULAR | Status: AC
  Administered 2022-07-12: 50 ug via INTRAVENOUS
  Filled 2022-07-12: qty 1

## 2022-07-12 MED ORDER — ONDANSETRON HCL 4 MG/2ML IJ SOLN
4.0000 mg | Freq: Once | INTRAMUSCULAR | Status: AC
  Administered 2022-07-12: 4 mg via INTRAVENOUS
  Filled 2022-07-12: qty 2

## 2022-07-12 NOTE — ED Notes (Signed)
Patient transported back CT

## 2022-07-12 NOTE — ED Notes (Signed)
This NT placed a purewick on this pt.
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
## 2022-07-12 NOTE — ED Notes (Signed)
Pt agreed with discharge, denied pain at this time. Refuse pain medication. Family bedside. Understood discharge instructions

## 2022-07-14 ENCOUNTER — Other Ambulatory Visit: Payer: Self-pay

## 2022-07-14 MED ORDER — LIDOCAINE 5 % EX PTCH
MEDICATED_PATCH | CUTANEOUS | 0 refills | Status: DC
  Filled 2022-07-14: qty 10, 10d supply, fill #0

## 2022-07-14 MED ORDER — DICLOFENAC SODIUM 1 % EX GEL
CUTANEOUS | 0 refills | Status: AC
  Filled 2022-07-14: qty 100, 13d supply, fill #0

## 2022-07-17 ENCOUNTER — Other Ambulatory Visit: Payer: Self-pay

## 2022-07-26 ENCOUNTER — Other Ambulatory Visit: Payer: Self-pay

## 2022-07-26 MED ORDER — CYCLOBENZAPRINE HCL 10 MG PO TABS
ORAL_TABLET | ORAL | 0 refills | Status: DC
  Filled 2022-07-26: qty 30, 10d supply, fill #0

## 2022-07-31 ENCOUNTER — Other Ambulatory Visit: Payer: Self-pay

## 2022-07-31 MED ORDER — TRULICITY 0.75 MG/0.5ML ~~LOC~~ SOAJ
0.7500 mg | SUBCUTANEOUS | 3 refills | Status: AC
  Filled 2022-07-31: qty 2, 28d supply, fill #0

## 2022-08-04 ENCOUNTER — Other Ambulatory Visit: Payer: Self-pay

## 2022-08-04 MED ORDER — BUSPIRONE HCL 5 MG PO TABS
5.0000 mg | ORAL_TABLET | Freq: Three times a day (TID) | ORAL | 11 refills | Status: AC
  Filled 2022-08-04: qty 90, 30d supply, fill #0

## 2022-08-04 MED ORDER — METHOCARBAMOL 500 MG PO TABS
1000.0000 mg | ORAL_TABLET | Freq: Four times a day (QID) | ORAL | 0 refills | Status: AC | PRN
  Filled 2022-08-04: qty 60, 8d supply, fill #0

## 2022-08-10 ENCOUNTER — Other Ambulatory Visit: Payer: Self-pay

## 2022-08-10 ENCOUNTER — Other Ambulatory Visit (HOSPITAL_COMMUNITY): Payer: Self-pay

## 2022-08-10 MED ORDER — FAMOTIDINE 20 MG PO TABS
20.0000 mg | ORAL_TABLET | Freq: Two times a day (BID) | ORAL | 3 refills | Status: AC
  Filled 2022-08-10: qty 180, 90d supply, fill #0
  Filled 2023-02-20: qty 180, 90d supply, fill #1

## 2022-08-10 MED ORDER — LIDOCAINE 5 % EX PTCH
MEDICATED_PATCH | CUTANEOUS | 0 refills | Status: AC
  Filled 2022-08-10 – 2023-03-14 (×2): qty 10, 10d supply, fill #0

## 2022-08-14 ENCOUNTER — Other Ambulatory Visit: Payer: Self-pay

## 2022-08-14 MED ORDER — KETOROLAC TROMETHAMINE 10 MG PO TABS
10.0000 mg | ORAL_TABLET | Freq: Four times a day (QID) | ORAL | 0 refills | Status: AC | PRN
  Filled 2022-08-14: qty 20, 5d supply, fill #0

## 2022-08-18 ENCOUNTER — Other Ambulatory Visit: Payer: Self-pay

## 2022-08-18 MED ORDER — TRUEPLUS LANCETS 28G MISC
Freq: Three times a day (TID) | 10 refills | Status: AC
  Filled 2022-08-18: qty 100, 33d supply, fill #0

## 2022-08-22 ENCOUNTER — Other Ambulatory Visit: Payer: Self-pay

## 2022-08-31 ENCOUNTER — Other Ambulatory Visit: Payer: Self-pay

## 2022-08-31 MED ORDER — VALSARTAN 80 MG PO TABS
80.0000 mg | ORAL_TABLET | Freq: Every day | ORAL | 11 refills | Status: AC
  Filled 2022-08-31: qty 30, 30d supply, fill #0
  Filled 2022-11-04: qty 30, 30d supply, fill #1

## 2022-09-12 ENCOUNTER — Other Ambulatory Visit: Payer: Self-pay

## 2022-09-21 ENCOUNTER — Other Ambulatory Visit: Payer: Self-pay

## 2022-09-21 MED ORDER — LEVOTHYROXINE SODIUM 200 MCG PO TABS
200.0000 ug | ORAL_TABLET | Freq: Every day | ORAL | 3 refills | Status: AC
  Filled 2022-09-21: qty 90, 90d supply, fill #0

## 2022-09-22 ENCOUNTER — Other Ambulatory Visit: Payer: Self-pay

## 2022-09-22 MED ORDER — LIDOCAINE 5 % EX PTCH
MEDICATED_PATCH | CUTANEOUS | 0 refills | Status: AC
  Filled 2022-09-22: qty 10, 10d supply, fill #0

## 2022-09-22 MED ORDER — FAMOTIDINE 20 MG PO TABS
20.0000 mg | ORAL_TABLET | Freq: Two times a day (BID) | ORAL | 3 refills | Status: AC
  Filled 2022-09-22: qty 60, 30d supply, fill #0
  Filled 2023-02-19: qty 180, 90d supply, fill #0

## 2022-09-22 MED ORDER — ALBUTEROL SULFATE (2.5 MG/3ML) 0.083% IN NEBU
3.0000 mL | INHALATION_SOLUTION | RESPIRATORY_TRACT | 1 refills | Status: AC | PRN
  Filled 2022-09-22: qty 90, 5d supply, fill #0

## 2022-09-22 MED ORDER — JARDIANCE 25 MG PO TABS
25.0000 mg | ORAL_TABLET | ORAL | 11 refills | Status: AC
  Filled 2022-09-22: qty 30, 30d supply, fill #0

## 2022-09-22 MED ORDER — DICLOFENAC SODIUM 1 % EX GEL: 2.0000 g | Freq: Four times a day (QID) | CUTANEOUS | 11 refills | Status: AC

## 2022-09-22 MED ORDER — TRULICITY 0.75 MG/0.5ML ~~LOC~~ SOAJ
0.7500 mg | SUBCUTANEOUS | 3 refills | Status: AC
  Filled 2022-09-22: qty 2, 28d supply, fill #0

## 2022-09-27 ENCOUNTER — Other Ambulatory Visit: Payer: Self-pay

## 2022-09-27 MED ORDER — PREDNISONE 20 MG PO TABS
ORAL_TABLET | ORAL | 0 refills | Status: AC
  Filled 2022-09-27: qty 12, 6d supply, fill #0

## 2022-09-27 MED ORDER — BENZONATATE 100 MG PO CAPS
ORAL_CAPSULE | ORAL | 0 refills | Status: AC
  Filled 2022-09-27: qty 21, 7d supply, fill #0

## 2022-10-04 ENCOUNTER — Other Ambulatory Visit: Payer: Self-pay

## 2022-10-04 MED ORDER — HYDROCODONE BIT-HOMATROP MBR 5-1.5 MG/5ML PO SOLN
5.0000 mL | Freq: Every day | ORAL | 0 refills | Status: AC
  Filled 2022-10-04: qty 35, 7d supply, fill #0

## 2022-10-04 MED ORDER — COMPRESSOR NEBULIZER MISC: 0 refills | Status: AC

## 2022-10-04 MED ORDER — BENZONATATE 200 MG PO CAPS
200.0000 mg | ORAL_CAPSULE | Freq: Three times a day (TID) | ORAL | 0 refills | Status: AC
  Filled 2022-10-04: qty 21, 7d supply, fill #0

## 2022-10-04 MED ORDER — AMOXICILLIN-POT CLAVULANATE 875-125 MG PO TABS
1.0000 | ORAL_TABLET | Freq: Two times a day (BID) | ORAL | 0 refills | Status: AC
  Filled 2022-10-04: qty 20, 10d supply, fill #0

## 2022-10-05 ENCOUNTER — Other Ambulatory Visit: Payer: Self-pay

## 2022-10-18 ENCOUNTER — Other Ambulatory Visit: Payer: Self-pay

## 2022-10-18 MED ORDER — LEVOTHYROXINE SODIUM 25 MCG PO TABS
25.0000 ug | ORAL_TABLET | Freq: Every day | ORAL | 3 refills | Status: AC
  Filled 2022-10-18: qty 90, 90d supply, fill #0

## 2022-10-18 MED ORDER — CETIRIZINE HCL 10 MG PO TABS
10.0000 mg | ORAL_TABLET | Freq: Every day | ORAL | 3 refills | Status: AC
  Filled 2022-10-18 – 2023-03-14 (×2): qty 90, 90d supply, fill #0

## 2022-10-18 MED ORDER — LIDOCAINE 5 % EX PTCH
MEDICATED_PATCH | CUTANEOUS | 0 refills | Status: AC
  Filled 2022-10-18 – 2023-01-12 (×3): qty 10, 10d supply, fill #0

## 2022-10-18 MED ORDER — VALSARTAN 80 MG PO TABS
80.0000 mg | ORAL_TABLET | Freq: Every day | ORAL | 3 refills | Status: AC
  Filled 2022-10-18: qty 90, 90d supply, fill #0

## 2022-10-23 ENCOUNTER — Other Ambulatory Visit: Payer: Self-pay

## 2022-10-23 MED ORDER — LEVOTHYROXINE SODIUM 200 MCG PO TABS
200.0000 ug | ORAL_TABLET | Freq: Every day | ORAL | 3 refills | Status: AC
  Filled 2022-10-23: qty 90, 90d supply, fill #0

## 2022-10-23 MED ORDER — LEVOTHYROXINE SODIUM 50 MCG PO TABS
50.0000 ug | ORAL_TABLET | Freq: Every day | ORAL | 3 refills | Status: AC
  Filled 2022-10-23 – 2022-11-13 (×2): qty 90, 90d supply, fill #0

## 2022-10-25 ENCOUNTER — Other Ambulatory Visit: Payer: Self-pay

## 2022-10-27 ENCOUNTER — Other Ambulatory Visit: Payer: Self-pay

## 2022-10-27 MED ORDER — ROPINIROLE HCL 0.25 MG PO TABS
0.2500 mg | ORAL_TABLET | Freq: Every day | ORAL | 3 refills | Status: DC
  Filled 2022-10-27: qty 30, 30d supply, fill #0
  Filled 2023-02-19 – 2023-03-15 (×3): qty 30, 30d supply, fill #1
  Filled 2023-06-29: qty 30, 30d supply, fill #2

## 2022-10-27 MED ORDER — TRULICITY 1.5 MG/0.5ML ~~LOC~~ SOAJ
1.5000 mg | SUBCUTANEOUS | 2 refills | Status: AC
  Filled 2022-10-27: qty 2, 28d supply, fill #0
  Filled 2023-03-14 – 2023-03-15 (×2): qty 2, 28d supply, fill #1

## 2022-10-27 MED ORDER — DICLOFENAC SODIUM 1 % EX GEL: CUTANEOUS | 11 refills | Status: AC

## 2022-10-27 MED ORDER — FLUTICASONE PROPIONATE 50 MCG/ACT NA SUSP
1.0000 | Freq: Every day | NASAL | 0 refills | Status: AC
  Filled 2022-10-27: qty 16, 25d supply, fill #0

## 2022-10-27 MED ORDER — AMOXICILLIN 500 MG PO CAPS
500.0000 mg | ORAL_CAPSULE | Freq: Three times a day (TID) | ORAL | 0 refills | Status: AC
  Filled 2022-10-27: qty 15, 5d supply, fill #0

## 2022-10-27 MED ORDER — LIDOCAINE 5 % EX PTCH
MEDICATED_PATCH | CUTANEOUS | 0 refills | Status: AC
  Filled 2022-10-27: qty 10, 10d supply, fill #0

## 2022-10-30 ENCOUNTER — Other Ambulatory Visit: Payer: Self-pay

## 2022-11-06 ENCOUNTER — Other Ambulatory Visit: Payer: Self-pay

## 2022-11-13 ENCOUNTER — Other Ambulatory Visit: Payer: Self-pay

## 2022-11-13 MED ORDER — TRAZODONE HCL 50 MG PO TABS
ORAL_TABLET | ORAL | 0 refills | Status: DC
  Filled 2022-11-13: qty 90, 90d supply, fill #0

## 2022-11-14 ENCOUNTER — Other Ambulatory Visit: Payer: Self-pay

## 2022-11-16 ENCOUNTER — Other Ambulatory Visit: Payer: Self-pay

## 2022-11-16 MED ORDER — VALSARTAN 80 MG PO TABS
80.0000 mg | ORAL_TABLET | Freq: Every day | ORAL | 3 refills | Status: AC
  Filled 2022-11-16: qty 90, 90d supply, fill #0
  Filled 2023-02-20: qty 90, 90d supply, fill #1
  Filled 2023-08-24: qty 90, 90d supply, fill #2

## 2022-11-20 ENCOUNTER — Other Ambulatory Visit: Payer: Self-pay

## 2022-11-20 MED ORDER — TRAZODONE HCL 50 MG PO TABS
75.0000 mg | ORAL_TABLET | Freq: Every evening | ORAL | 0 refills | Status: AC
  Filled 2022-12-27: qty 45, 30d supply, fill #0
  Filled 2023-01-11: qty 90, 60d supply, fill #0
  Filled 2023-02-19 – 2023-02-20 (×2): qty 45, 30d supply, fill #0

## 2022-11-20 MED ORDER — VALSARTAN 80 MG PO TABS
80.0000 mg | ORAL_TABLET | Freq: Every day | ORAL | 3 refills | Status: AC
  Filled 2022-11-20: qty 90, 90d supply, fill #0
  Filled 2023-02-19: qty 30, 30d supply, fill #0

## 2022-11-20 MED ORDER — ROPINIROLE HCL 0.25 MG PO TABS
0.7500 mg | ORAL_TABLET | Freq: Every evening | ORAL | 3 refills | Status: DC
  Filled 2023-02-19 – 2023-07-12 (×2): qty 90, 30d supply, fill #0

## 2022-11-20 MED ORDER — ATORVASTATIN CALCIUM 40 MG PO TABS
40.0000 mg | ORAL_TABLET | Freq: Every day | ORAL | 3 refills | Status: AC
  Filled 2022-11-20 – 2023-02-20 (×2): qty 90, 90d supply, fill #0
  Filled 2023-05-11: qty 90, 90d supply, fill #1
  Filled 2023-08-24: qty 90, 90d supply, fill #2

## 2022-11-20 MED ORDER — ATORVASTATIN CALCIUM 40 MG PO TABS
40.0000 mg | ORAL_TABLET | Freq: Every day | ORAL | 3 refills | Status: AC
  Filled 2022-11-20: qty 90, 90d supply, fill #0
  Filled 2023-02-19: qty 90, 90d supply, fill #1

## 2022-11-20 MED ORDER — TRULICITY 1.5 MG/0.5ML ~~LOC~~ SOAJ
1.5000 mg | SUBCUTANEOUS | 2 refills | Status: AC
  Filled 2022-11-20: qty 2, 28d supply, fill #0
  Filled 2022-12-27 – 2023-02-20 (×5): qty 2, 28d supply, fill #1

## 2022-11-20 MED ORDER — DICLOFENAC SODIUM 1 % EX GEL
CUTANEOUS | 11 refills | Status: AC
  Filled 2022-11-20: qty 300, 37d supply, fill #0
  Filled 2022-12-27: qty 300, 37d supply, fill #1
  Filled 2022-12-28: qty 100, 12d supply, fill #1
  Filled 2023-01-11 – 2023-02-19 (×2): qty 300, 37d supply, fill #1

## 2022-11-21 ENCOUNTER — Other Ambulatory Visit: Payer: Self-pay

## 2022-12-07 ENCOUNTER — Other Ambulatory Visit: Payer: Self-pay

## 2022-12-07 MED ORDER — ROPINIROLE HCL 0.25 MG PO TABS
0.7500 mg | ORAL_TABLET | Freq: Every evening | ORAL | 3 refills | Status: DC
  Filled 2022-12-07: qty 90, 30d supply, fill #0
  Filled 2022-12-27 – 2023-01-12 (×4): qty 90, 30d supply, fill #1
  Filled 2023-02-20: qty 90, 30d supply, fill #2

## 2022-12-08 ENCOUNTER — Other Ambulatory Visit: Payer: Self-pay

## 2022-12-11 ENCOUNTER — Other Ambulatory Visit: Payer: Self-pay

## 2022-12-11 MED ORDER — ONDANSETRON 4 MG PO TBDP
4.0000 mg | ORAL_TABLET | Freq: Three times a day (TID) | ORAL | 0 refills | Status: AC | PRN
  Filled 2022-12-11: qty 14, 5d supply, fill #0

## 2022-12-18 ENCOUNTER — Other Ambulatory Visit: Payer: Self-pay

## 2022-12-18 MED ORDER — LEVOTHYROXINE SODIUM 75 MCG PO TABS
75.0000 ug | ORAL_TABLET | Freq: Every day | ORAL | 3 refills | Status: DC
  Filled 2022-12-18 – 2023-02-20 (×5): qty 90, 90d supply, fill #0

## 2022-12-18 MED ORDER — LEVOTHYROXINE SODIUM 200 MCG PO TABS
200.0000 ug | ORAL_TABLET | Freq: Every day | ORAL | 3 refills | Status: AC
  Filled 2022-12-18 – 2023-01-12 (×6): qty 90, 90d supply, fill #0

## 2022-12-18 MED ORDER — JARDIANCE 25 MG PO TABS
25.0000 mg | ORAL_TABLET | Freq: Every morning | ORAL | 3 refills | Status: AC
Start: 2022-12-18 — End: ?

## 2022-12-20 ENCOUNTER — Other Ambulatory Visit: Payer: Self-pay

## 2022-12-27 ENCOUNTER — Other Ambulatory Visit: Payer: Self-pay

## 2022-12-28 ENCOUNTER — Other Ambulatory Visit: Payer: Self-pay

## 2023-01-03 ENCOUNTER — Other Ambulatory Visit: Payer: Self-pay

## 2023-01-12 ENCOUNTER — Other Ambulatory Visit (HOSPITAL_COMMUNITY): Payer: Self-pay

## 2023-01-12 ENCOUNTER — Other Ambulatory Visit: Payer: Self-pay

## 2023-01-12 MED ORDER — LEVOTHYROXINE SODIUM 300 MCG PO TABS
ORAL_TABLET | ORAL | 3 refills | Status: AC
  Filled 2023-01-12 – 2023-01-31 (×2): qty 90, 90d supply, fill #0

## 2023-01-22 ENCOUNTER — Other Ambulatory Visit: Payer: Self-pay

## 2023-01-31 ENCOUNTER — Other Ambulatory Visit: Payer: Self-pay

## 2023-02-01 ENCOUNTER — Other Ambulatory Visit: Payer: Self-pay

## 2023-02-20 ENCOUNTER — Other Ambulatory Visit: Payer: Self-pay

## 2023-02-21 ENCOUNTER — Other Ambulatory Visit: Payer: Self-pay

## 2023-02-22 ENCOUNTER — Encounter (HOSPITAL_COMMUNITY): Payer: Self-pay | Admitting: Emergency Medicine

## 2023-02-22 ENCOUNTER — Other Ambulatory Visit: Payer: Self-pay

## 2023-02-22 ENCOUNTER — Emergency Department (HOSPITAL_COMMUNITY): Payer: No Typology Code available for payment source

## 2023-02-22 ENCOUNTER — Emergency Department (HOSPITAL_COMMUNITY)
Admission: EM | Admit: 2023-02-22 | Discharge: 2023-02-22 | Disposition: A | Discharge status: Home / Self Care | Payer: No Typology Code available for payment source | Attending: Emergency Medicine | Admitting: Emergency Medicine

## 2023-02-22 DIAGNOSIS — I10 Essential (primary) hypertension: Secondary | ICD-10-CM | POA: Diagnosis not present

## 2023-02-22 DIAGNOSIS — Z794 Long term (current) use of insulin: Secondary | ICD-10-CM | POA: Insufficient documentation

## 2023-02-22 DIAGNOSIS — Z7984 Long term (current) use of oral hypoglycemic drugs: Secondary | ICD-10-CM | POA: Diagnosis not present

## 2023-02-22 DIAGNOSIS — Z7982 Long term (current) use of aspirin: Secondary | ICD-10-CM | POA: Insufficient documentation

## 2023-02-22 DIAGNOSIS — M79621 Pain in right upper arm: Secondary | ICD-10-CM | POA: Diagnosis present

## 2023-02-22 DIAGNOSIS — E119 Type 2 diabetes mellitus without complications: Secondary | ICD-10-CM | POA: Insufficient documentation

## 2023-02-22 DIAGNOSIS — Z79899 Other long term (current) drug therapy: Secondary | ICD-10-CM | POA: Diagnosis not present

## 2023-02-22 DIAGNOSIS — M79601 Pain in right arm: Secondary | ICD-10-CM

## 2023-02-22 MED ORDER — TIZANIDINE HCL 4 MG PO TABS
4.0000 mg | ORAL_TABLET | Freq: Three times a day (TID) | ORAL | 0 refills | Status: AC | PRN
  Filled 2023-02-22: qty 10, 4d supply, fill #0

## 2023-02-22 MED ORDER — OXYCODONE-ACETAMINOPHEN 5-325 MG PO TABS: 1.0000 | ORAL_TABLET | Freq: Once | ORAL | Status: DC

## 2023-02-22 MED ORDER — NAPROXEN 250 MG PO TABS: 500.0000 mg | ORAL_TABLET | Freq: Once | ORAL | Status: DC

## 2023-02-22 MED ORDER — DIAZEPAM 5 MG PO TABS
5.0000 mg | ORAL_TABLET | Freq: Once | ORAL | Status: AC
  Administered 2023-02-22: 5 mg via ORAL
  Filled 2023-02-22: qty 1

## 2023-02-22 MED ORDER — KETOROLAC TROMETHAMINE 60 MG/2ML IM SOLN
15.0000 mg | Freq: Once | INTRAMUSCULAR | Status: AC
  Administered 2023-02-22: 15 mg via INTRAMUSCULAR
  Filled 2023-02-22: qty 2

## 2023-02-22 NOTE — ED Provider Notes (Signed)
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 cosigned this note including patient's presenting symptoms, physical exam, and planned diagnostics and interventions. Attending physician stated agreement with plan or made changes to plan which were implemented.   Attending physician assessed patient at bedside.  Final Clinical Impression(s) / ED Diagnoses Final diagnoses:  Musculoskeletal pain of right upper extremity    Rx / DC Orders ED Discharge Orders          Ordered    tiZANidine (ZANAFLEX) 4 MG tablet  Every 8 hours PRN        02/22/23 1626              Achille Rich, PA-C 02/24/23 1951    Derwood Kaplan, MD 02/25/23 [phone]

## 2023-02-22 NOTE — ED Notes (Signed)
Patient transported to X-ray 

## 2023-02-22 NOTE — Discharge Instructions (Addendum)
You were seen in the ER today for evalution of your right upper extremity pain. Please make sure that you follow up with an orthopedic provider. It shows that you have some arthiritis and calficied tendons. This could be what is causing you pain. I recommend that you try 1000mg  of Tylenol and 600mg  of ibuprofen every 6 hours as needed for pain. I am going to prescribe you a new muscle relaxer to take as needed. Please do not drive or operate heavy machinery while on this medication as it can make you sleepy. If you have any concerns, new or worsening symptoms, please return to the nearest ER for re-evaluation.
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
## 2023-02-23 ENCOUNTER — Other Ambulatory Visit: Payer: Self-pay

## 2023-02-23 MED ORDER — NAPROXEN 500 MG PO TABS
500.0000 mg | ORAL_TABLET | ORAL | 0 refills | Status: AC
  Filled 2023-02-23: qty 14, 7d supply, fill #0

## 2023-03-01 ENCOUNTER — Other Ambulatory Visit: Payer: Self-pay

## 2023-03-14 ENCOUNTER — Other Ambulatory Visit (HOSPITAL_COMMUNITY): Payer: Self-pay

## 2023-03-15 ENCOUNTER — Other Ambulatory Visit (HOSPITAL_COMMUNITY): Payer: Self-pay

## 2023-03-15 ENCOUNTER — Other Ambulatory Visit: Payer: Self-pay

## 2023-04-04 ENCOUNTER — Other Ambulatory Visit: Payer: Self-pay

## 2023-04-04 MED ORDER — PANTOPRAZOLE SODIUM 40 MG PO TBEC
DELAYED_RELEASE_TABLET | ORAL | 0 refills | Status: DC
  Filled 2023-04-04: qty 90, 90d supply, fill #0

## 2023-04-12 ENCOUNTER — Other Ambulatory Visit: Payer: Self-pay

## 2023-04-12 MED ORDER — GABAPENTIN 300 MG PO CAPS
900.0000 mg | ORAL_CAPSULE | Freq: Every evening | ORAL | 0 refills | Status: AC
  Filled 2023-04-12: qty 270, 90d supply, fill #0

## 2023-04-12 MED ORDER — TRULICITY 1.5 MG/0.5ML ~~LOC~~ SOAJ
1.5000 mg | SUBCUTANEOUS | 3 refills | Status: AC
  Filled 2023-04-12: qty 2, 28d supply, fill #0

## 2023-04-12 MED ORDER — ROPINIROLE HCL 0.25 MG PO TABS
0.7500 mg | ORAL_TABLET | Freq: Every evening | ORAL | 3 refills | Status: DC
  Filled 2023-04-12: qty 90, 30d supply, fill #0

## 2023-04-12 MED ORDER — TRAMADOL HCL 50 MG PO TABS
50.0000 mg | ORAL_TABLET | Freq: Three times a day (TID) | ORAL | 0 refills | Status: AC | PRN
  Filled 2023-04-12: qty 60, 20d supply, fill #0

## 2023-04-12 MED ORDER — SERTRALINE HCL 100 MG PO TABS
150.0000 mg | ORAL_TABLET | Freq: Every day | ORAL | 3 refills | Status: AC
  Filled 2023-04-12: qty 90, 60d supply, fill #0

## 2023-04-13 ENCOUNTER — Other Ambulatory Visit: Payer: Self-pay

## 2023-04-13 MED ORDER — LEVOTHYROXINE SODIUM 300 MCG PO TABS: 300.0000 ug | ORAL_TABLET | ORAL | 3 refills | Status: AC

## 2023-04-16 ENCOUNTER — Other Ambulatory Visit (HOSPITAL_COMMUNITY): Payer: Self-pay

## 2023-04-16 ENCOUNTER — Other Ambulatory Visit: Payer: Self-pay

## 2023-04-16 MED ORDER — DULOXETINE HCL 20 MG PO CPEP
20.0000 mg | ORAL_CAPSULE | Freq: Every day | ORAL | 2 refills | Status: AC
  Filled 2023-04-16: qty 30, 30d supply, fill #0
  Filled 2023-05-06 – 2023-05-11 (×2): qty 30, 30d supply, fill #1

## 2023-04-16 MED ORDER — SERTRALINE HCL 50 MG PO TABS: ORAL_TABLET | ORAL | 0 refills | Status: AC

## 2023-04-16 MED ORDER — CITALOPRAM HYDROBROMIDE 20 MG PO TABS
20.0000 mg | ORAL_TABLET | Freq: Every day | ORAL | 2 refills | Status: DC
  Filled 2023-04-16: qty 30, 30d supply, fill #0

## 2023-05-07 ENCOUNTER — Other Ambulatory Visit: Payer: Self-pay

## 2023-05-11 ENCOUNTER — Other Ambulatory Visit: Payer: Self-pay

## 2023-05-15 ENCOUNTER — Other Ambulatory Visit: Payer: Self-pay

## 2023-05-15 MED ORDER — ATORVASTATIN CALCIUM 40 MG PO TABS
40.0000 mg | ORAL_TABLET | Freq: Every day | ORAL | 3 refills | Status: AC
  Filled 2023-05-15: qty 90, 90d supply, fill #0

## 2023-05-17 ENCOUNTER — Other Ambulatory Visit: Payer: Self-pay

## 2023-05-17 MED ORDER — DULOXETINE HCL 20 MG PO CPEP
20.0000 mg | ORAL_CAPSULE | Freq: Every day | ORAL | 3 refills | Status: AC
  Filled 2023-05-17 – 2023-06-29 (×2): qty 90, 90d supply, fill #0

## 2023-05-18 ENCOUNTER — Other Ambulatory Visit: Payer: Self-pay

## 2023-05-18 MED ORDER — LEVOTHYROXINE SODIUM 300 MCG PO TABS
ORAL_TABLET | ORAL | 3 refills | Status: AC
  Filled 2023-05-18: qty 42, 85d supply, fill #0
  Filled 2023-08-24: qty 42, 85d supply, fill #1
  Filled 2023-08-24: qty 34, 68d supply, fill #1
  Filled 2023-08-24: qty 42, 85d supply, fill #1
  Filled 2023-12-11 (×2): qty 42, 85d supply, fill #2

## 2023-05-18 MED ORDER — TRULICITY 3 MG/0.5ML ~~LOC~~ SOAJ
SUBCUTANEOUS | 3 refills | Status: AC
  Filled 2023-05-18: qty 2, 28d supply, fill #0

## 2023-05-21 ENCOUNTER — Other Ambulatory Visit: Payer: Self-pay

## 2023-05-22 ENCOUNTER — Other Ambulatory Visit: Payer: Self-pay

## 2023-05-22 MED ORDER — ROPINIROLE HCL 0.25 MG PO TABS
0.7500 mg | ORAL_TABLET | Freq: Every evening | ORAL | 3 refills | Status: DC
  Filled 2023-05-22: qty 90, 30d supply, fill #0
  Filled 2023-06-28: qty 90, 30d supply, fill #1

## 2023-05-24 ENCOUNTER — Other Ambulatory Visit: Payer: Self-pay

## 2023-05-24 MED ORDER — DULOXETINE HCL 40 MG PO CPEP
1.0000 | ORAL_CAPSULE | Freq: Every day | ORAL | 3 refills | Status: AC
  Filled 2023-05-24: qty 90, 90d supply, fill #0
  Filled 2023-06-28: qty 90, 90d supply, fill #1

## 2023-05-25 ENCOUNTER — Other Ambulatory Visit: Payer: Self-pay

## 2023-05-31 ENCOUNTER — Other Ambulatory Visit: Payer: Self-pay

## 2023-05-31 MED ORDER — TRULICITY 1.5 MG/0.5ML ~~LOC~~ SOAJ
1.5000 mg | SUBCUTANEOUS | 1 refills | Status: AC
  Filled 2023-05-31 – 2023-12-11 (×3): qty 2, 28d supply, fill #0

## 2023-06-01 ENCOUNTER — Other Ambulatory Visit: Payer: Self-pay

## 2023-06-13 ENCOUNTER — Other Ambulatory Visit: Payer: Self-pay

## 2023-06-19 ENCOUNTER — Other Ambulatory Visit: Payer: Self-pay

## 2023-06-19 MED ORDER — TIZANIDINE HCL 4 MG PO TABS
4.0000 mg | ORAL_TABLET | Freq: Two times a day (BID) | ORAL | 0 refills | Status: DC
  Filled 2023-06-19 – 2023-08-24 (×2): qty 30, 15d supply, fill #0

## 2023-06-28 ENCOUNTER — Other Ambulatory Visit: Payer: Self-pay

## 2023-06-28 MED ORDER — PANTOPRAZOLE SODIUM 40 MG PO TBEC
40.0000 mg | DELAYED_RELEASE_TABLET | Freq: Every day | ORAL | 2 refills | Status: AC
  Filled 2023-06-28: qty 90, 90d supply, fill #0
  Filled 2023-12-11 (×2): qty 90, 90d supply, fill #1

## 2023-06-29 ENCOUNTER — Other Ambulatory Visit: Payer: Self-pay

## 2023-06-29 ENCOUNTER — Other Ambulatory Visit (HOSPITAL_COMMUNITY): Payer: Self-pay

## 2023-06-29 MED ORDER — TIZANIDINE HCL 4 MG PO TABS
4.0000 mg | ORAL_TABLET | Freq: Two times a day (BID) | ORAL | 0 refills | Status: AC
  Filled 2023-06-29: qty 40, 20d supply, fill #0

## 2023-06-29 MED ORDER — ROPINIROLE HCL 0.25 MG PO TABS
0.7500 mg | ORAL_TABLET | Freq: Every evening | ORAL | 3 refills | Status: AC
  Filled 2023-07-12: qty 90, 30d supply, fill #0
  Filled 2023-08-24: qty 90, 30d supply, fill #1
  Filled 2023-12-11 (×2): qty 90, 30d supply, fill #2

## 2023-07-02 ENCOUNTER — Other Ambulatory Visit: Payer: Self-pay

## 2023-07-02 MED ORDER — TRULICITY 1.5 MG/0.5ML ~~LOC~~ SOAJ
1.5000 mg | SUBCUTANEOUS | 3 refills | Status: AC
  Filled 2023-07-02: qty 2, 28d supply, fill #0
  Filled 2023-08-24: qty 2, 28d supply, fill #1

## 2023-07-02 MED ORDER — VALSARTAN 80 MG PO TABS
80.0000 mg | ORAL_TABLET | Freq: Every day | ORAL | 3 refills | Status: AC
  Filled 2023-07-02: qty 30, 30d supply, fill #0
  Filled 2023-12-11 (×2): qty 30, 30d supply, fill #1

## 2023-07-09 ENCOUNTER — Other Ambulatory Visit: Payer: Self-pay

## 2023-07-12 ENCOUNTER — Other Ambulatory Visit: Payer: Self-pay

## 2023-07-24 ENCOUNTER — Other Ambulatory Visit: Payer: Self-pay

## 2023-07-24 MED ORDER — PAXLOVID (300/100) 20 X 150 MG & 10 X 100MG PO TBPK
ORAL_TABLET | ORAL | 0 refills | Status: AC
  Filled 2023-07-24 – 2023-07-25 (×2): qty 30, 5d supply, fill #0

## 2023-07-24 MED ORDER — AMOXICILLIN-POT CLAVULANATE 875-125 MG PO TABS
1.0000 | ORAL_TABLET | Freq: Two times a day (BID) | ORAL | 0 refills | Status: AC
  Filled 2023-07-24: qty 14, 7d supply, fill #0

## 2023-07-25 ENCOUNTER — Other Ambulatory Visit: Payer: Self-pay

## 2023-07-25 ENCOUNTER — Other Ambulatory Visit (HOSPITAL_COMMUNITY): Payer: Self-pay

## 2023-08-07 ENCOUNTER — Other Ambulatory Visit: Payer: Self-pay

## 2023-08-07 MED ORDER — ALBUTEROL SULFATE (2.5 MG/3ML) 0.083% IN NEBU
3.0000 mL | INHALATION_SOLUTION | RESPIRATORY_TRACT | 3 refills | Status: AC | PRN
  Filled 2023-08-07: qty 150, 9d supply, fill #0

## 2023-08-07 MED ORDER — GABAPENTIN 300 MG PO CAPS
900.0000 mg | ORAL_CAPSULE | Freq: Every evening | ORAL | 3 refills | Status: AC
Start: 2023-08-07 — End: ?
  Filled 2023-08-07 – 2023-08-24 (×2): qty 270, 90d supply, fill #0

## 2023-08-08 ENCOUNTER — Other Ambulatory Visit: Payer: Self-pay

## 2023-08-08 MED ORDER — TRULICITY 1.5 MG/0.5ML ~~LOC~~ SOAJ
1.5000 mg | SUBCUTANEOUS | 0 refills | Status: AC
  Filled 2023-08-08: qty 2, 28d supply, fill #0

## 2023-08-08 MED ORDER — ROPINIROLE HCL 0.25 MG PO TABS
0.7500 mg | ORAL_TABLET | Freq: Every evening | ORAL | 3 refills | Status: AC
  Filled 2023-08-08: qty 270, 90d supply, fill #0
  Filled 2023-10-04: qty 90, 30d supply, fill #0
  Filled 2023-10-05: qty 270, 90d supply, fill #0
  Filled 2024-01-28: qty 90, 30d supply, fill #1
  Filled 2024-03-02: qty 90, 30d supply, fill #2

## 2023-08-16 ENCOUNTER — Other Ambulatory Visit: Payer: Self-pay

## 2023-08-17 ENCOUNTER — Other Ambulatory Visit: Payer: Self-pay

## 2023-08-24 ENCOUNTER — Other Ambulatory Visit: Payer: Self-pay

## 2023-08-24 MED ORDER — NAPROXEN 500 MG PO TABS
500.0000 mg | ORAL_TABLET | Freq: Two times a day (BID) | ORAL | 0 refills | Status: AC
  Filled 2023-08-24: qty 14, 7d supply, fill #0

## 2023-08-24 MED ORDER — PREDNISONE 20 MG PO TABS
20.0000 mg | ORAL_TABLET | Freq: Every day | ORAL | 0 refills | Status: AC
  Filled 2023-08-24: qty 5, 5d supply, fill #0

## 2023-08-27 ENCOUNTER — Other Ambulatory Visit: Payer: Self-pay

## 2023-08-27 MED ORDER — DIAZEPAM 5 MG PO TABS
ORAL_TABLET | ORAL | 0 refills | Status: AC
Start: 2023-08-27 — End: ?
  Filled 2023-08-27 – 2023-09-06 (×2): qty 2, 1d supply, fill #0

## 2023-08-27 MED ORDER — ACETAMINOPHEN 500 MG PO TABS
1000.0000 mg | ORAL_TABLET | Freq: Four times a day (QID) | ORAL | 0 refills | Status: AC | PRN
  Filled 2023-08-27: qty 112, 14d supply, fill #0

## 2023-08-27 MED ORDER — TIZANIDINE HCL 4 MG PO TABS: 4.0000 mg | ORAL_TABLET | Freq: Two times a day (BID) | ORAL | 0 refills | Status: AC

## 2023-09-05 ENCOUNTER — Other Ambulatory Visit: Payer: Self-pay

## 2023-09-06 ENCOUNTER — Other Ambulatory Visit: Payer: Self-pay

## 2023-09-19 ENCOUNTER — Other Ambulatory Visit: Payer: Self-pay

## 2023-09-19 MED ORDER — DULOXETINE HCL 40 MG PO CPEP
1.0000 | ORAL_CAPSULE | Freq: Every day | ORAL | 3 refills | Status: AC
  Filled 2023-09-19 – 2023-09-20 (×2): qty 90, 90d supply, fill #0

## 2023-09-20 ENCOUNTER — Other Ambulatory Visit: Payer: Self-pay

## 2023-09-20 MED ORDER — DIAZEPAM 10 MG PO TABS
10.0000 mg | ORAL_TABLET | ORAL | 0 refills | Status: AC
  Filled 2023-09-20: qty 1, 1d supply, fill #0

## 2023-09-26 ENCOUNTER — Other Ambulatory Visit: Payer: Self-pay

## 2023-10-02 ENCOUNTER — Other Ambulatory Visit: Payer: Self-pay

## 2023-10-04 ENCOUNTER — Other Ambulatory Visit: Payer: Self-pay

## 2023-10-05 ENCOUNTER — Other Ambulatory Visit: Payer: Self-pay

## 2023-10-05 MED ORDER — TRULICITY 3 MG/0.5ML ~~LOC~~ SOAJ
3.0000 mg | SUBCUTANEOUS | 3 refills | Status: AC
  Filled 2023-10-05: qty 2, 28d supply, fill #0

## 2023-10-05 MED ORDER — DRIZALMA SPRINKLE 60 MG PO CSDR
60.0000 mg | DELAYED_RELEASE_CAPSULE | Freq: Every day | ORAL | 3 refills | Status: AC
  Filled 2023-10-05 – 2023-12-11 (×2): qty 90, 90d supply, fill #0

## 2023-10-05 MED ORDER — DULOXETINE HCL 60 MG PO CPEP
60.0000 mg | ORAL_CAPSULE | Freq: Every day | ORAL | 3 refills | Status: AC
  Filled 2023-10-05: qty 90, 90d supply, fill #0

## 2023-10-05 MED ORDER — SIMETHICONE 80 MG PO CHEW
80.0000 mg | CHEWABLE_TABLET | Freq: Four times a day (QID) | ORAL | 0 refills | Status: DC | PRN
  Filled 2023-10-05: qty 30, 8d supply, fill #0

## 2023-10-12 ENCOUNTER — Other Ambulatory Visit: Payer: Self-pay

## 2023-10-17 ENCOUNTER — Other Ambulatory Visit: Payer: Self-pay

## 2023-10-17 MED ORDER — DULOXETINE HCL 60 MG PO CPEP
60.0000 mg | ORAL_CAPSULE | Freq: Every day | ORAL | 3 refills | Status: AC
  Filled 2024-01-16: qty 25, 25d supply, fill #0
  Filled 2024-01-16: qty 90, 90d supply, fill #0
  Filled 2024-01-16: qty 65, 65d supply, fill #0
  Filled 2024-01-16 (×2): qty 90, 90d supply, fill #0

## 2023-10-17 MED ORDER — TRULICITY 1.5 MG/0.5ML ~~LOC~~ SOAJ
1.5000 mg | SUBCUTANEOUS | 3 refills | Status: AC
  Filled 2023-10-17 – 2023-12-11 (×3): qty 2, 28d supply, fill #0

## 2023-10-17 MED ORDER — FLUTICASONE PROPIONATE 50 MCG/ACT NA SUSP
1.0000 | Freq: Every day | NASAL | 0 refills | Status: AC
  Filled 2023-10-17 – 2023-12-11 (×2): qty 16, 60d supply, fill #0
  Filled 2023-12-11: qty 16, 25d supply, fill #0

## 2023-10-17 MED ORDER — GABAPENTIN 300 MG PO CAPS
900.0000 mg | ORAL_CAPSULE | Freq: Every evening | ORAL | 3 refills | Status: AC
  Filled 2023-10-17 – 2023-12-11 (×3): qty 270, 90d supply, fill #0

## 2023-10-30 ENCOUNTER — Other Ambulatory Visit: Payer: Self-pay

## 2023-11-15 ENCOUNTER — Other Ambulatory Visit: Payer: Self-pay

## 2023-11-15 MED ORDER — LEVOTHYROXINE SODIUM 137 MCG PO TABS
ORAL_TABLET | ORAL | 1 refills | Status: DC
  Filled 2023-11-15: qty 30, 60d supply, fill #0

## 2023-11-16 ENCOUNTER — Other Ambulatory Visit: Payer: Self-pay

## 2023-11-16 MED ORDER — LEVOTHYROXINE SODIUM 137 MCG PO TABS
137.0000 ug | ORAL_TABLET | Freq: Every day | ORAL | 1 refills | Status: AC
  Filled 2023-11-16 (×2): qty 30, 30d supply, fill #0

## 2023-11-23 ENCOUNTER — Other Ambulatory Visit: Payer: Self-pay

## 2023-11-27 ENCOUNTER — Other Ambulatory Visit: Payer: Self-pay

## 2023-12-11 ENCOUNTER — Other Ambulatory Visit (HOSPITAL_COMMUNITY): Payer: Self-pay

## 2023-12-11 ENCOUNTER — Other Ambulatory Visit: Payer: Self-pay | Admitting: Medical Genetics

## 2023-12-11 ENCOUNTER — Other Ambulatory Visit: Payer: Self-pay

## 2023-12-12 ENCOUNTER — Other Ambulatory Visit (HOSPITAL_COMMUNITY): Payer: Self-pay

## 2023-12-12 ENCOUNTER — Other Ambulatory Visit: Payer: Self-pay

## 2023-12-13 ENCOUNTER — Other Ambulatory Visit (HOSPITAL_COMMUNITY): Payer: Self-pay

## 2023-12-13 ENCOUNTER — Other Ambulatory Visit: Payer: Self-pay

## 2023-12-14 ENCOUNTER — Other Ambulatory Visit: Payer: Self-pay

## 2023-12-17 ENCOUNTER — Encounter (HOSPITAL_COMMUNITY): Payer: Self-pay | Admitting: Pharmacist

## 2023-12-17 ENCOUNTER — Other Ambulatory Visit (HOSPITAL_COMMUNITY): Payer: Self-pay

## 2023-12-17 ENCOUNTER — Other Ambulatory Visit: Payer: Self-pay

## 2023-12-19 ENCOUNTER — Other Ambulatory Visit (HOSPITAL_COMMUNITY): Payer: Self-pay

## 2023-12-20 ENCOUNTER — Other Ambulatory Visit: Payer: Self-pay

## 2023-12-20 MED ORDER — LEVOTHYROXINE SODIUM 150 MCG PO TABS
ORAL_TABLET | ORAL | 2 refills | Status: AC
  Filled 2023-12-20: qty 30, 30d supply, fill #0

## 2023-12-21 ENCOUNTER — Other Ambulatory Visit: Payer: Self-pay

## 2023-12-21 ENCOUNTER — Other Ambulatory Visit (HOSPITAL_COMMUNITY): Payer: Self-pay

## 2023-12-21 MED ORDER — LEVOTHYROXINE SODIUM 150 MCG PO TABS
150.0000 ug | ORAL_TABLET | Freq: Every day | ORAL | 2 refills | Status: AC
  Filled 2023-12-21 (×2): qty 30, 30d supply, fill #0

## 2023-12-25 ENCOUNTER — Other Ambulatory Visit: Payer: Self-pay

## 2023-12-25 MED ORDER — ONDANSETRON HCL 4 MG PO TABS
4.0000 mg | ORAL_TABLET | Freq: Three times a day (TID) | ORAL | 1 refills | Status: AC | PRN
  Filled 2023-12-25: qty 30, 10d supply, fill #0

## 2023-12-26 ENCOUNTER — Other Ambulatory Visit: Payer: Self-pay

## 2023-12-28 ENCOUNTER — Other Ambulatory Visit (HOSPITAL_COMMUNITY): Payer: Self-pay

## 2024-01-01 ENCOUNTER — Other Ambulatory Visit (HOSPITAL_COMMUNITY): Payer: Self-pay

## 2024-01-03 ENCOUNTER — Other Ambulatory Visit (HOSPITAL_COMMUNITY): Payer: Self-pay

## 2024-01-07 ENCOUNTER — Other Ambulatory Visit (HOSPITAL_COMMUNITY): Payer: Self-pay

## 2024-01-09 ENCOUNTER — Other Ambulatory Visit (HOSPITAL_COMMUNITY): Payer: Self-pay

## 2024-01-10 ENCOUNTER — Other Ambulatory Visit (HOSPITAL_COMMUNITY): Payer: Self-pay

## 2024-01-16 ENCOUNTER — Other Ambulatory Visit: Payer: Self-pay

## 2024-01-17 ENCOUNTER — Other Ambulatory Visit: Payer: Self-pay

## 2024-01-17 MED ORDER — GLUCOSE BLOOD VI STRP
ORAL_STRIP | 12 refills | Status: AC
  Filled 2024-01-17: qty 100, 33d supply, fill #0

## 2024-01-18 ENCOUNTER — Other Ambulatory Visit: Payer: Self-pay

## 2024-01-18 MED ORDER — LEVOTHYROXINE SODIUM 175 MCG PO TABS
175.0000 ug | ORAL_TABLET | Freq: Every day | ORAL | 3 refills | Status: AC
  Filled 2024-01-18: qty 90, 90d supply, fill #0

## 2024-01-28 ENCOUNTER — Other Ambulatory Visit: Payer: Self-pay

## 2024-02-05 ENCOUNTER — Other Ambulatory Visit: Payer: Self-pay

## 2024-02-05 MED ORDER — VALSARTAN 80 MG PO TABS
160.0000 mg | ORAL_TABLET | Freq: Every day | ORAL | 3 refills | Status: AC
  Filled 2024-02-05: qty 90, 45d supply, fill #0

## 2024-02-08 ENCOUNTER — Other Ambulatory Visit: Payer: Self-pay

## 2024-02-08 MED ORDER — PANTOPRAZOLE SODIUM 40 MG PO TBEC
40.0000 mg | DELAYED_RELEASE_TABLET | Freq: Two times a day (BID) | ORAL | 2 refills | Status: AC
  Filled 2024-02-08: qty 90, 45d supply, fill #0
  Filled 2024-05-30 (×2): qty 90, 45d supply, fill #1

## 2024-02-08 MED ORDER — DULOXETINE HCL 30 MG PO CPEP
30.0000 mg | ORAL_CAPSULE | Freq: Every day | ORAL | 3 refills | Status: AC
  Filled 2024-02-08: qty 90, 90d supply, fill #0

## 2024-02-08 MED ORDER — VALSARTAN 160 MG PO TABS
160.0000 mg | ORAL_TABLET | Freq: Every day | ORAL | 3 refills | Status: AC
  Filled 2024-02-08: qty 90, 90d supply, fill #0

## 2024-02-11 ENCOUNTER — Other Ambulatory Visit: Payer: Self-pay

## 2024-02-28 ENCOUNTER — Other Ambulatory Visit (HOSPITAL_COMMUNITY): Payer: Self-pay

## 2024-03-02 ENCOUNTER — Other Ambulatory Visit (HOSPITAL_COMMUNITY): Payer: Self-pay

## 2024-03-03 ENCOUNTER — Other Ambulatory Visit (HOSPITAL_COMMUNITY): Payer: Self-pay

## 2024-03-04 ENCOUNTER — Other Ambulatory Visit (HOSPITAL_COMMUNITY): Payer: Self-pay

## 2024-03-04 ENCOUNTER — Other Ambulatory Visit: Payer: Self-pay

## 2024-03-07 ENCOUNTER — Other Ambulatory Visit: Payer: Self-pay

## 2024-03-07 MED ORDER — ONDANSETRON HCL 4 MG PO TABS
4.0000 mg | ORAL_TABLET | Freq: Three times a day (TID) | ORAL | 1 refills | Status: AC | PRN
Start: 2024-03-07 — End: ?
  Filled 2024-03-07: qty 30, 10d supply, fill #0

## 2024-03-10 ENCOUNTER — Other Ambulatory Visit (HOSPITAL_COMMUNITY): Payer: Self-pay

## 2024-03-10 ENCOUNTER — Other Ambulatory Visit: Payer: Self-pay

## 2024-03-10 MED ORDER — SIMETHICONE 80 MG PO CHEW: 80.0000 mg | CHEWABLE_TABLET | Freq: Four times a day (QID) | ORAL | 0 refills | Status: AC | PRN

## 2024-03-10 MED ORDER — LEVOTHYROXINE SODIUM 200 MCG PO TABS
200.0000 ug | ORAL_TABLET | Freq: Every day | ORAL | 11 refills | Status: AC
  Filled 2024-03-10: qty 30, 30d supply, fill #0

## 2024-03-12 ENCOUNTER — Other Ambulatory Visit (HOSPITAL_COMMUNITY): Payer: Self-pay

## 2024-03-12 MED ORDER — SIMETHICONE 80 MG PO CHEW: CHEWABLE_TABLET | ORAL | 0 refills | Status: AC

## 2024-03-28 ENCOUNTER — Other Ambulatory Visit: Payer: Self-pay

## 2024-03-28 MED ORDER — TRULICITY 4.5 MG/0.5ML ~~LOC~~ SOAJ
4.5000 mg | SUBCUTANEOUS | 1 refills | Status: AC
  Filled 2024-03-28: qty 2, 28d supply, fill #0

## 2024-03-28 MED ORDER — ROPINIROLE HCL 0.25 MG PO TABS
0.7500 mg | ORAL_TABLET | ORAL | 3 refills | Status: AC
  Filled 2024-03-28: qty 90, 30d supply, fill #0

## 2024-03-28 MED ORDER — DULOXETINE HCL 30 MG PO CPEP
90.0000 mg | ORAL_CAPSULE | ORAL | 3 refills | Status: AC
  Filled 2024-03-28: qty 90, 30d supply, fill #0

## 2024-03-28 MED ORDER — CYCLOBENZAPRINE HCL 10 MG PO TABS
5.0000 mg | ORAL_TABLET | ORAL | 1 refills | Status: AC | PRN
  Filled 2024-03-28: qty 30, 20d supply, fill #0

## 2024-03-31 ENCOUNTER — Other Ambulatory Visit: Payer: Self-pay

## 2024-05-05 ENCOUNTER — Other Ambulatory Visit: Payer: Self-pay

## 2024-05-05 MED ORDER — GABAPENTIN 300 MG PO CAPS
900.0000 mg | ORAL_CAPSULE | Freq: Every evening | ORAL | 3 refills | Status: AC
  Filled 2024-05-05: qty 270, 90d supply, fill #0

## 2024-05-06 ENCOUNTER — Other Ambulatory Visit: Payer: Self-pay

## 2024-05-06 MED ORDER — ROPINIROLE HCL 0.25 MG PO TABS
0.7500 mg | ORAL_TABLET | Freq: Every evening | ORAL | 3 refills | Status: AC
  Filled 2024-05-06: qty 270, 90d supply, fill #0

## 2024-05-07 ENCOUNTER — Other Ambulatory Visit: Payer: Self-pay

## 2024-05-08 ENCOUNTER — Other Ambulatory Visit: Payer: Self-pay

## 2024-05-23 ENCOUNTER — Other Ambulatory Visit: Payer: Self-pay

## 2024-05-23 MED ORDER — GLIPIZIDE ER 5 MG PO TB24
5.0000 mg | ORAL_TABLET | Freq: Every day | ORAL | 3 refills | Status: AC
  Filled 2024-05-23: qty 90, 90d supply, fill #0

## 2024-05-23 MED ORDER — LEVOTHYROXINE SODIUM 125 MCG PO TABS
250.0000 ug | ORAL_TABLET | ORAL | 11 refills | Status: AC
  Filled 2024-05-23: qty 60, 30d supply, fill #0

## 2024-05-23 MED ORDER — HYDROCHLOROTHIAZIDE 12.5 MG PO TABS
12.5000 mg | ORAL_TABLET | Freq: Every day | ORAL | 3 refills | Status: AC
  Filled 2024-05-23: qty 100, 100d supply, fill #0

## 2024-05-26 ENCOUNTER — Other Ambulatory Visit: Payer: Self-pay

## 2024-05-30 ENCOUNTER — Other Ambulatory Visit: Payer: Self-pay
